# Patient Record
Sex: Female | Born: 1945 | Race: White | Hispanic: No | Marital: Married | Smoking: Former smoker
Health system: Southern US, Community
[De-identification: ages and names within clinical notes are randomized; demographics above are authoritative.]

## PROBLEM LIST (undated history)

## (undated) DIAGNOSIS — A048 Other specified bacterial intestinal infections: Secondary | ICD-10-CM

## (undated) DIAGNOSIS — K219 Gastro-esophageal reflux disease without esophagitis: Secondary | ICD-10-CM

## (undated) DIAGNOSIS — E079 Disorder of thyroid, unspecified: Secondary | ICD-10-CM

## (undated) DIAGNOSIS — E119 Type 2 diabetes mellitus without complications: Secondary | ICD-10-CM

## (undated) DIAGNOSIS — I10 Essential (primary) hypertension: Secondary | ICD-10-CM

## (undated) DIAGNOSIS — E785 Hyperlipidemia, unspecified: Secondary | ICD-10-CM

## (undated) HISTORY — DX: Gastro-esophageal reflux disease without esophagitis: K21.9

## (undated) HISTORY — PX: EYE SURGERY: SHX253

## (undated) HISTORY — PX: THROAT SURGERY: SHX803

## (undated) HISTORY — PX: BREAST BIOPSY: SHX20

## (undated) HISTORY — PX: CHOLECYSTECTOMY: SHX55

## (undated) HISTORY — DX: Type 2 diabetes mellitus without complications: E11.9

## (undated) HISTORY — PX: OTHER SURGICAL HISTORY: SHX169

## (undated) HISTORY — PX: ABDOMINAL HYSTERECTOMY: SHX81

## (undated) HISTORY — PX: HEMORRHOID SURGERY: SHX153

## (undated) HISTORY — DX: Other specified bacterial intestinal infections: A04.8

---

## 1998-02-12 ENCOUNTER — Other Ambulatory Visit: Admission: RE | Admit: 1998-02-12 | Discharge: 1998-02-12 | Payer: Self-pay | Admitting: Family Medicine

## 1998-07-03 ENCOUNTER — Other Ambulatory Visit: Admission: RE | Admit: 1998-07-03 | Discharge: 1998-07-03 | Payer: Self-pay | Admitting: *Deleted

## 1999-06-02 ENCOUNTER — Ambulatory Visit (HOSPITAL_COMMUNITY): Admission: RE | Admit: 1999-06-02 | Discharge: 1999-06-02 | Payer: Self-pay | Admitting: Gastroenterology

## 2000-07-16 ENCOUNTER — Other Ambulatory Visit: Admission: RE | Admit: 2000-07-16 | Discharge: 2000-07-16 | Payer: Self-pay | Admitting: Family Medicine

## 2000-07-30 ENCOUNTER — Ambulatory Visit (HOSPITAL_COMMUNITY): Admission: RE | Admit: 2000-07-30 | Discharge: 2000-07-30 | Payer: Self-pay | Admitting: General Surgery

## 2000-07-30 ENCOUNTER — Encounter: Payer: Self-pay | Admitting: General Surgery

## 2001-02-17 ENCOUNTER — Encounter: Payer: Self-pay | Admitting: General Surgery

## 2001-02-17 ENCOUNTER — Ambulatory Visit (HOSPITAL_COMMUNITY): Admission: RE | Admit: 2001-02-17 | Discharge: 2001-02-17 | Payer: Self-pay | Admitting: General Surgery

## 2001-02-21 ENCOUNTER — Ambulatory Visit (HOSPITAL_COMMUNITY): Admission: RE | Admit: 2001-02-21 | Discharge: 2001-02-21 | Payer: Self-pay | Admitting: General Surgery

## 2001-02-21 ENCOUNTER — Encounter: Payer: Self-pay | Admitting: General Surgery

## 2001-02-23 ENCOUNTER — Ambulatory Visit (HOSPITAL_COMMUNITY): Admission: RE | Admit: 2001-02-23 | Discharge: 2001-02-23 | Payer: Self-pay | Admitting: General Surgery

## 2001-03-21 ENCOUNTER — Encounter: Payer: Self-pay | Admitting: General Surgery

## 2001-03-21 ENCOUNTER — Ambulatory Visit (HOSPITAL_COMMUNITY): Admission: RE | Admit: 2001-03-21 | Discharge: 2001-03-21 | Payer: Self-pay | Admitting: General Surgery

## 2001-05-26 ENCOUNTER — Ambulatory Visit (HOSPITAL_COMMUNITY): Admission: RE | Admit: 2001-05-26 | Discharge: 2001-05-26 | Payer: Self-pay | Admitting: Internal Medicine

## 2002-01-19 ENCOUNTER — Encounter: Payer: Self-pay | Admitting: General Surgery

## 2002-01-19 ENCOUNTER — Ambulatory Visit (HOSPITAL_COMMUNITY): Admission: RE | Admit: 2002-01-19 | Discharge: 2002-01-19 | Payer: Self-pay | Admitting: Internal Medicine

## 2002-01-19 ENCOUNTER — Ambulatory Visit (HOSPITAL_COMMUNITY): Admission: RE | Admit: 2002-01-19 | Discharge: 2002-01-19 | Payer: Self-pay | Admitting: Pediatrics

## 2002-01-19 ENCOUNTER — Encounter: Payer: Self-pay | Admitting: Internal Medicine

## 2002-05-05 ENCOUNTER — Encounter: Payer: Self-pay | Admitting: General Surgery

## 2002-05-05 ENCOUNTER — Ambulatory Visit (HOSPITAL_COMMUNITY): Admission: RE | Admit: 2002-05-05 | Discharge: 2002-05-05 | Payer: Self-pay | Admitting: General Surgery

## 2002-05-19 ENCOUNTER — Ambulatory Visit (HOSPITAL_COMMUNITY): Admission: RE | Admit: 2002-05-19 | Discharge: 2002-05-19 | Payer: Self-pay | Admitting: General Surgery

## 2002-09-08 ENCOUNTER — Ambulatory Visit (HOSPITAL_COMMUNITY): Admission: RE | Admit: 2002-09-08 | Discharge: 2002-09-08 | Payer: Self-pay | Admitting: Internal Medicine

## 2003-10-05 ENCOUNTER — Ambulatory Visit (HOSPITAL_COMMUNITY): Admission: RE | Admit: 2003-10-05 | Discharge: 2003-10-05 | Payer: Self-pay | Admitting: Cardiology

## 2003-10-25 ENCOUNTER — Ambulatory Visit (HOSPITAL_COMMUNITY): Admission: RE | Admit: 2003-10-25 | Discharge: 2003-10-25 | Payer: Self-pay | Admitting: General Surgery

## 2004-05-09 ENCOUNTER — Emergency Department (HOSPITAL_COMMUNITY): Admission: EM | Admit: 2004-05-09 | Discharge: 2004-05-09 | Payer: Self-pay | Admitting: Emergency Medicine

## 2005-11-26 ENCOUNTER — Ambulatory Visit: Payer: Self-pay | Admitting: Family Medicine

## 2005-11-27 ENCOUNTER — Encounter (INDEPENDENT_AMBULATORY_CARE_PROVIDER_SITE_OTHER): Payer: Self-pay | Admitting: Family Medicine

## 2005-11-27 ENCOUNTER — Ambulatory Visit (HOSPITAL_COMMUNITY): Admission: RE | Admit: 2005-11-27 | Discharge: 2005-11-27 | Payer: Self-pay | Admitting: Family Medicine

## 2005-11-27 LAB — CONVERTED CEMR LAB
RBC count: 4.44 10*6/uL
TSH: 4.928 microintl units/mL

## 2005-12-07 ENCOUNTER — Ambulatory Visit (HOSPITAL_COMMUNITY): Admission: RE | Admit: 2005-12-07 | Discharge: 2005-12-07 | Payer: Self-pay | Admitting: Family Medicine

## 2005-12-10 ENCOUNTER — Encounter: Payer: Self-pay | Admitting: Family Medicine

## 2005-12-10 DIAGNOSIS — M129 Arthropathy, unspecified: Secondary | ICD-10-CM | POA: Insufficient documentation

## 2005-12-10 DIAGNOSIS — I10 Essential (primary) hypertension: Secondary | ICD-10-CM | POA: Insufficient documentation

## 2005-12-10 DIAGNOSIS — K219 Gastro-esophageal reflux disease without esophagitis: Secondary | ICD-10-CM | POA: Insufficient documentation

## 2005-12-10 DIAGNOSIS — K5900 Constipation, unspecified: Secondary | ICD-10-CM | POA: Insufficient documentation

## 2005-12-10 DIAGNOSIS — F411 Generalized anxiety disorder: Secondary | ICD-10-CM | POA: Insufficient documentation

## 2005-12-22 ENCOUNTER — Ambulatory Visit: Payer: Self-pay | Admitting: Family Medicine

## 2006-02-25 ENCOUNTER — Ambulatory Visit: Payer: Self-pay | Admitting: Family Medicine

## 2006-02-25 DIAGNOSIS — E785 Hyperlipidemia, unspecified: Secondary | ICD-10-CM | POA: Insufficient documentation

## 2006-03-01 ENCOUNTER — Encounter (INDEPENDENT_AMBULATORY_CARE_PROVIDER_SITE_OTHER): Payer: Self-pay | Admitting: Family Medicine

## 2006-03-08 ENCOUNTER — Encounter (INDEPENDENT_AMBULATORY_CARE_PROVIDER_SITE_OTHER): Payer: Self-pay | Admitting: Family Medicine

## 2006-03-12 ENCOUNTER — Encounter (INDEPENDENT_AMBULATORY_CARE_PROVIDER_SITE_OTHER): Payer: Self-pay | Admitting: Family Medicine

## 2006-03-30 ENCOUNTER — Ambulatory Visit: Payer: Self-pay | Admitting: Internal Medicine

## 2006-03-30 ENCOUNTER — Encounter (INDEPENDENT_AMBULATORY_CARE_PROVIDER_SITE_OTHER): Payer: Self-pay | Admitting: Family Medicine

## 2007-06-10 ENCOUNTER — Ambulatory Visit (HOSPITAL_COMMUNITY): Admission: RE | Admit: 2007-06-10 | Discharge: 2007-06-10 | Payer: Self-pay | Admitting: General Surgery

## 2007-06-20 ENCOUNTER — Ambulatory Visit (HOSPITAL_COMMUNITY): Admission: RE | Admit: 2007-06-20 | Discharge: 2007-06-20 | Payer: Self-pay | Admitting: General Surgery

## 2007-07-22 ENCOUNTER — Ambulatory Visit: Payer: Self-pay | Admitting: Family Medicine

## 2007-07-22 DIAGNOSIS — M51379 Other intervertebral disc degeneration, lumbosacral region without mention of lumbar back pain or lower extremity pain: Secondary | ICD-10-CM | POA: Insufficient documentation

## 2007-07-22 DIAGNOSIS — K449 Diaphragmatic hernia without obstruction or gangrene: Secondary | ICD-10-CM | POA: Insufficient documentation

## 2007-07-22 DIAGNOSIS — M5137 Other intervertebral disc degeneration, lumbosacral region: Secondary | ICD-10-CM

## 2007-07-22 DIAGNOSIS — Z8679 Personal history of other diseases of the circulatory system: Secondary | ICD-10-CM

## 2007-07-22 DIAGNOSIS — F17200 Nicotine dependence, unspecified, uncomplicated: Secondary | ICD-10-CM | POA: Insufficient documentation

## 2007-07-25 ENCOUNTER — Encounter (INDEPENDENT_AMBULATORY_CARE_PROVIDER_SITE_OTHER): Payer: Self-pay | Admitting: Family Medicine

## 2007-07-27 ENCOUNTER — Ambulatory Visit (HOSPITAL_COMMUNITY): Admission: RE | Admit: 2007-07-27 | Discharge: 2007-07-27 | Payer: Self-pay | Admitting: Family Medicine

## 2007-07-27 ENCOUNTER — Encounter (INDEPENDENT_AMBULATORY_CARE_PROVIDER_SITE_OTHER): Payer: Self-pay | Admitting: Family Medicine

## 2007-07-27 LAB — CONVERTED CEMR LAB
AST: 28 units/L (ref 0–37)
Albumin: 4.7 g/dL (ref 3.5–5.2)
Alkaline Phosphatase: 66 units/L (ref 39–117)
BUN: 16 mg/dL (ref 6–23)
Basophils Absolute: 0 10*3/uL (ref 0.0–0.1)
Chloride: 100 meq/L (ref 96–112)
Creatinine, Ser: 0.76 mg/dL (ref 0.40–1.20)
Glucose, Bld: 106 mg/dL — ABNORMAL HIGH (ref 70–99)
HCT: 44.3 % (ref 36.0–46.0)
HDL: 39 mg/dL — ABNORMAL LOW (ref >39)
LDL Cholesterol: 90 mg/dL (ref 0–99)
MCV: 92.9 fL (ref 78.0–100.0)
Neutro Abs: 1.6 10*3/uL — ABNORMAL LOW (ref 1.7–7.7)
Neutrophils Relative %: 34 % — ABNORMAL LOW (ref 43–77)
Platelets: 285 10*3/uL (ref 150–400)
Potassium: 4.5 meq/L (ref 3.5–5.3)
RBC: 4.77 M/uL (ref 3.87–5.11)
RDW: 14.1 % (ref 11.5–15.5)
TSH: 6.336 microintl units/mL — ABNORMAL HIGH (ref 0.350–4.50)
Total Bilirubin: 1 mg/dL (ref 0.3–1.2)
Total CHOL/HDL Ratio: 5
Triglycerides: 328 mg/dL — ABNORMAL HIGH (ref <150)
WBC: 4.8 10*3/uL (ref 4.0–10.5)

## 2007-08-01 ENCOUNTER — Ambulatory Visit (HOSPITAL_COMMUNITY): Admission: RE | Admit: 2007-08-01 | Discharge: 2007-08-01 | Payer: Self-pay | Admitting: Family Medicine

## 2007-08-05 ENCOUNTER — Ambulatory Visit: Payer: Self-pay | Admitting: Family Medicine

## 2007-08-05 LAB — CONVERTED CEMR LAB

## 2007-08-08 ENCOUNTER — Encounter (HOSPITAL_COMMUNITY): Admission: RE | Admit: 2007-08-08 | Discharge: 2007-08-17 | Payer: Self-pay | Admitting: Family Medicine

## 2007-08-10 ENCOUNTER — Encounter (INDEPENDENT_AMBULATORY_CARE_PROVIDER_SITE_OTHER): Payer: Self-pay | Admitting: Family Medicine

## 2007-08-15 ENCOUNTER — Ambulatory Visit: Payer: Self-pay | Admitting: Internal Medicine

## 2007-08-17 ENCOUNTER — Encounter (INDEPENDENT_AMBULATORY_CARE_PROVIDER_SITE_OTHER): Payer: Self-pay | Admitting: Family Medicine

## 2007-08-17 ENCOUNTER — Encounter (INDEPENDENT_AMBULATORY_CARE_PROVIDER_SITE_OTHER): Payer: Self-pay | Admitting: Diagnostic Radiology

## 2007-08-22 ENCOUNTER — Ambulatory Visit (HOSPITAL_COMMUNITY): Admission: RE | Admit: 2007-08-22 | Discharge: 2007-08-22 | Payer: Self-pay | Admitting: Internal Medicine

## 2007-08-22 ENCOUNTER — Ambulatory Visit: Payer: Self-pay | Admitting: Internal Medicine

## 2007-09-02 ENCOUNTER — Ambulatory Visit (HOSPITAL_COMMUNITY): Admission: RE | Admit: 2007-09-02 | Discharge: 2007-09-02 | Payer: Self-pay | Admitting: Family Medicine

## 2007-09-02 ENCOUNTER — Ambulatory Visit: Payer: Self-pay | Admitting: Family Medicine

## 2007-09-03 ENCOUNTER — Encounter (INDEPENDENT_AMBULATORY_CARE_PROVIDER_SITE_OTHER): Payer: Self-pay | Admitting: Family Medicine

## 2007-09-05 ENCOUNTER — Encounter (INDEPENDENT_AMBULATORY_CARE_PROVIDER_SITE_OTHER): Payer: Self-pay | Admitting: Family Medicine

## 2007-09-06 LAB — CONVERTED CEMR LAB: Free T4: 0.78 ng/dL — ABNORMAL LOW (ref 0.89–1.80)

## 2007-09-09 ENCOUNTER — Ambulatory Visit: Payer: Self-pay | Admitting: Family Medicine

## 2007-09-30 ENCOUNTER — Ambulatory Visit: Payer: Self-pay | Admitting: Family Medicine

## 2007-09-30 DIAGNOSIS — E039 Hypothyroidism, unspecified: Secondary | ICD-10-CM | POA: Insufficient documentation

## 2007-10-01 ENCOUNTER — Encounter (INDEPENDENT_AMBULATORY_CARE_PROVIDER_SITE_OTHER): Payer: Self-pay | Admitting: Family Medicine

## 2007-10-05 LAB — CONVERTED CEMR LAB
BUN: 18 mg/dL (ref 6–23)
CO2: 17 meq/L — ABNORMAL LOW (ref 19–32)
Chloride: 107 meq/L (ref 96–112)
Creatinine, Ser: 0.84 mg/dL (ref 0.40–1.20)
Potassium: 4.2 meq/L (ref 3.5–5.3)

## 2007-11-04 ENCOUNTER — Ambulatory Visit: Payer: Self-pay | Admitting: Family Medicine

## 2007-11-05 ENCOUNTER — Emergency Department (HOSPITAL_COMMUNITY): Admission: EM | Admit: 2007-11-05 | Discharge: 2007-11-05 | Payer: Self-pay | Admitting: Emergency Medicine

## 2007-11-09 ENCOUNTER — Encounter (INDEPENDENT_AMBULATORY_CARE_PROVIDER_SITE_OTHER): Payer: Self-pay | Admitting: Family Medicine

## 2007-11-11 LAB — CONVERTED CEMR LAB
AST: 30 units/L (ref 0–37)
Albumin: 4.3 g/dL (ref 3.5–5.2)
Alkaline Phosphatase: 71 units/L (ref 39–117)
BUN: 17 mg/dL (ref 6–23)
Calcium: 9.5 mg/dL (ref 8.4–10.5)
Glucose, Bld: 113 mg/dL — ABNORMAL HIGH (ref 70–99)
LDL Cholesterol: 125 mg/dL — ABNORMAL HIGH (ref 0–99)
Sodium: 140 meq/L (ref 135–145)
TSH: 8.488 microintl units/mL — ABNORMAL HIGH (ref 0.350–4.50)
Total Bilirubin: 0.7 mg/dL (ref 0.3–1.2)
Total CHOL/HDL Ratio: 4.3
VLDL: 28 mg/dL (ref 0–40)

## 2007-12-13 ENCOUNTER — Ambulatory Visit: Payer: Self-pay | Admitting: Family Medicine

## 2007-12-14 ENCOUNTER — Telehealth (INDEPENDENT_AMBULATORY_CARE_PROVIDER_SITE_OTHER): Payer: Self-pay | Admitting: Family Medicine

## 2007-12-14 ENCOUNTER — Encounter (INDEPENDENT_AMBULATORY_CARE_PROVIDER_SITE_OTHER): Payer: Self-pay | Admitting: *Deleted

## 2007-12-22 ENCOUNTER — Ambulatory Visit (HOSPITAL_COMMUNITY): Admission: RE | Admit: 2007-12-22 | Discharge: 2007-12-22 | Payer: Self-pay | Admitting: Family Medicine

## 2007-12-28 ENCOUNTER — Encounter (INDEPENDENT_AMBULATORY_CARE_PROVIDER_SITE_OTHER): Payer: Self-pay | Admitting: Family Medicine

## 2008-01-10 ENCOUNTER — Emergency Department (HOSPITAL_COMMUNITY): Admission: EM | Admit: 2008-01-10 | Discharge: 2008-01-10 | Payer: Self-pay | Admitting: Emergency Medicine

## 2008-01-27 ENCOUNTER — Ambulatory Visit: Payer: Self-pay | Admitting: Family Medicine

## 2008-01-28 ENCOUNTER — Encounter (INDEPENDENT_AMBULATORY_CARE_PROVIDER_SITE_OTHER): Payer: Self-pay | Admitting: Family Medicine

## 2008-01-30 LAB — CONVERTED CEMR LAB
ALT: 31 units/L (ref 0–35)
Alkaline Phosphatase: 82 units/L (ref 39–117)
Amylase: 49 units/L (ref 0–105)
Basophils Absolute: 0 10*3/uL (ref 0.0–0.1)
Basophils Relative: 0 % (ref 0–1)
Creatinine, Ser: 0.92 mg/dL (ref 0.40–1.20)
Eosinophils Absolute: 0.1 10*3/uL (ref 0.0–0.7)
Eosinophils Relative: 1 % (ref 0–5)
HCT: 39.1 % (ref 36.0–46.0)
Hemoglobin: 13.1 g/dL (ref 12.0–15.0)
MCHC: 33.5 g/dL (ref 30.0–36.0)
MCV: 88.5 fL (ref 78.0–100.0)
Monocytes Absolute: 0.5 10*3/uL (ref 0.1–1.0)
Platelets: 292 10*3/uL (ref 150–400)
RDW: 13.1 % (ref 11.5–15.5)
Sodium: 138 meq/L (ref 135–145)
Total Bilirubin: 0.6 mg/dL (ref 0.3–1.2)
Total Protein: 7.7 g/dL (ref 6.0–8.3)

## 2008-02-02 ENCOUNTER — Emergency Department (HOSPITAL_COMMUNITY): Admission: EM | Admit: 2008-02-02 | Discharge: 2008-02-02 | Payer: Self-pay | Admitting: Emergency Medicine

## 2008-02-02 ENCOUNTER — Telehealth (INDEPENDENT_AMBULATORY_CARE_PROVIDER_SITE_OTHER): Payer: Self-pay | Admitting: Family Medicine

## 2008-02-02 ENCOUNTER — Encounter: Payer: Self-pay | Admitting: Orthopedic Surgery

## 2008-02-08 ENCOUNTER — Ambulatory Visit: Payer: Self-pay | Admitting: Orthopedic Surgery

## 2008-02-08 DIAGNOSIS — S8263XA Displaced fracture of lateral malleolus of unspecified fibula, initial encounter for closed fracture: Secondary | ICD-10-CM | POA: Insufficient documentation

## 2008-02-27 ENCOUNTER — Telehealth: Payer: Self-pay | Admitting: Orthopedic Surgery

## 2008-03-21 ENCOUNTER — Telehealth (INDEPENDENT_AMBULATORY_CARE_PROVIDER_SITE_OTHER): Payer: Self-pay | Admitting: *Deleted

## 2008-03-22 ENCOUNTER — Encounter: Payer: Self-pay | Admitting: Orthopedic Surgery

## 2008-04-12 ENCOUNTER — Ambulatory Visit: Payer: Self-pay | Admitting: Family Medicine

## 2008-04-13 ENCOUNTER — Encounter (INDEPENDENT_AMBULATORY_CARE_PROVIDER_SITE_OTHER): Payer: Self-pay | Admitting: Family Medicine

## 2008-04-30 ENCOUNTER — Encounter (INDEPENDENT_AMBULATORY_CARE_PROVIDER_SITE_OTHER): Payer: Self-pay | Admitting: Family Medicine

## 2008-06-13 ENCOUNTER — Ambulatory Visit: Payer: Self-pay | Admitting: Family Medicine

## 2008-06-29 ENCOUNTER — Ambulatory Visit: Payer: Self-pay | Admitting: Family Medicine

## 2008-06-30 ENCOUNTER — Encounter (INDEPENDENT_AMBULATORY_CARE_PROVIDER_SITE_OTHER): Payer: Self-pay | Admitting: Family Medicine

## 2008-07-02 ENCOUNTER — Encounter (INDEPENDENT_AMBULATORY_CARE_PROVIDER_SITE_OTHER): Payer: Self-pay | Admitting: Family Medicine

## 2008-07-02 LAB — CONVERTED CEMR LAB
BUN: 18 mg/dL (ref 6–23)
CO2: 21 meq/L (ref 19–32)
Chloride: 106 meq/L (ref 96–112)
Creatinine, Ser: 0.81 mg/dL (ref 0.40–1.20)
Potassium: 4.4 meq/L (ref 3.5–5.3)

## 2008-07-03 ENCOUNTER — Encounter (INDEPENDENT_AMBULATORY_CARE_PROVIDER_SITE_OTHER): Payer: Self-pay | Admitting: Family Medicine

## 2008-07-04 ENCOUNTER — Ambulatory Visit (HOSPITAL_COMMUNITY): Admission: RE | Admit: 2008-07-04 | Discharge: 2008-07-04 | Payer: Self-pay | Admitting: Family Medicine

## 2008-07-05 ENCOUNTER — Encounter (INDEPENDENT_AMBULATORY_CARE_PROVIDER_SITE_OTHER): Payer: Self-pay | Admitting: Family Medicine

## 2008-07-05 DIAGNOSIS — M545 Low back pain: Secondary | ICD-10-CM

## 2008-07-13 ENCOUNTER — Ambulatory Visit: Payer: Self-pay | Admitting: Family Medicine

## 2008-07-13 DIAGNOSIS — W57XXXA Bitten or stung by nonvenomous insect and other nonvenomous arthropods, initial encounter: Secondary | ICD-10-CM | POA: Insufficient documentation

## 2008-07-13 DIAGNOSIS — T148 Other injury of unspecified body region: Secondary | ICD-10-CM

## 2008-08-29 ENCOUNTER — Ambulatory Visit: Payer: Self-pay | Admitting: Family Medicine

## 2009-02-20 ENCOUNTER — Emergency Department (HOSPITAL_COMMUNITY): Admission: EM | Admit: 2009-02-20 | Discharge: 2009-02-20 | Payer: Self-pay | Admitting: Emergency Medicine

## 2009-10-18 IMAGING — US US AORTA
1 series · 14 of 25 positions shown · non-contrast
Comparison: CT from 12/07/2005

CLINICAL DATA: Abdominal pain and possible reaction to the flu
shunt.

ULTRASOUND AORTA

[Series 1: unknown · 0.30mm/px · 14 of 41 slices shown]
[im 1/41]
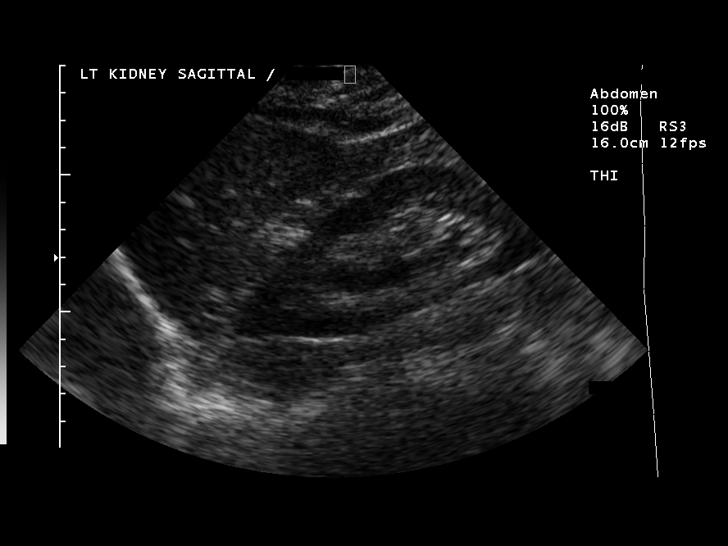
[im 4/41]
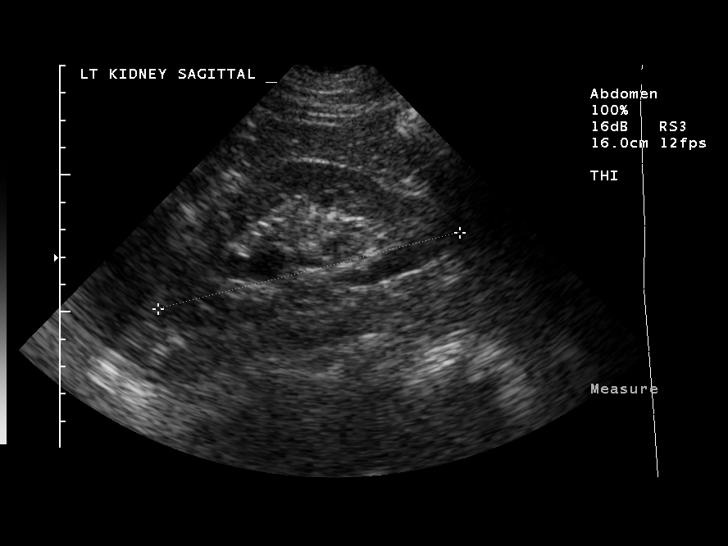
[im 7/41]
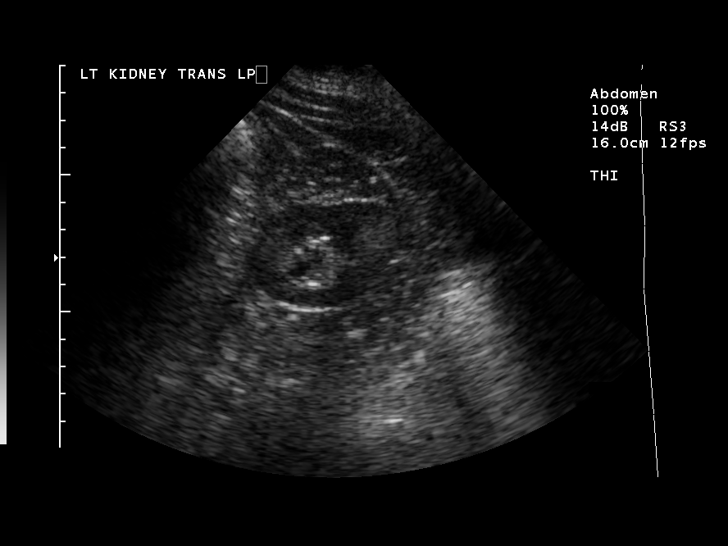
[im 11/41]
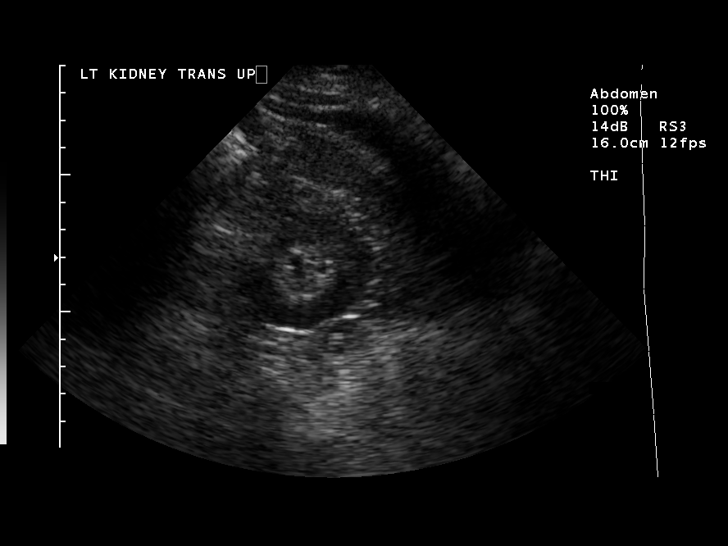
[im 14/41]
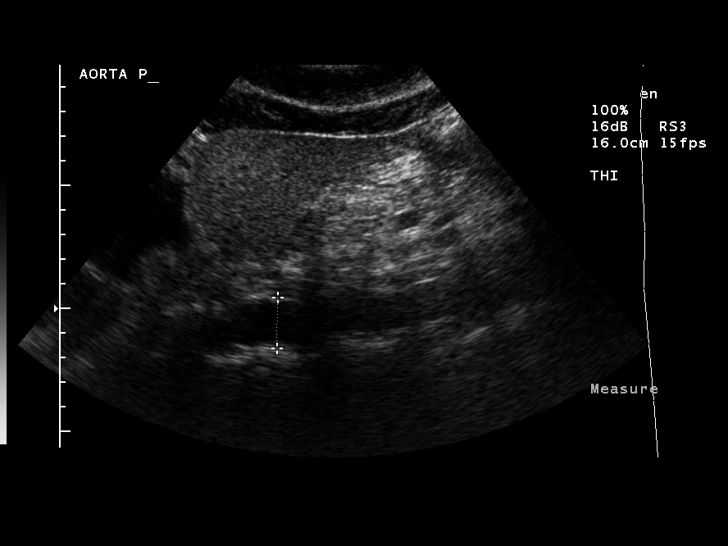
[im 16/41]
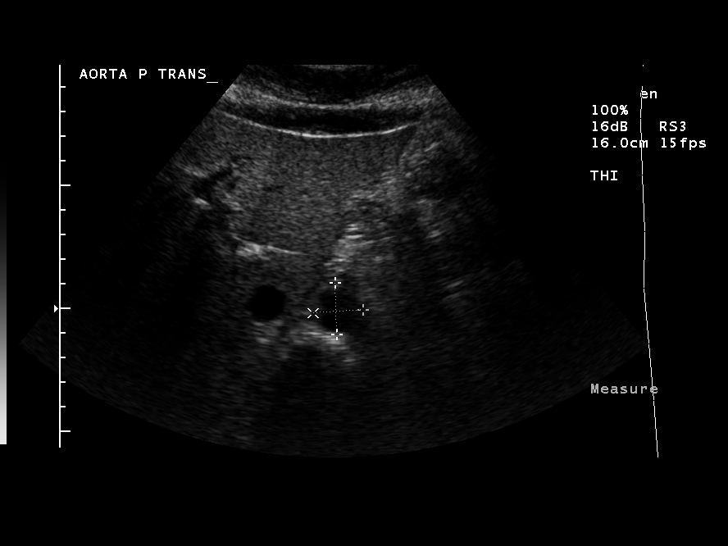
[im 19/41]
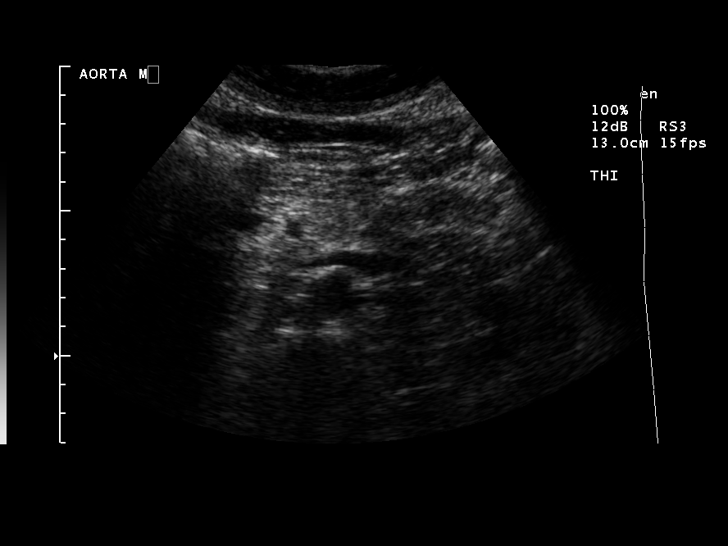
[im 22/41]
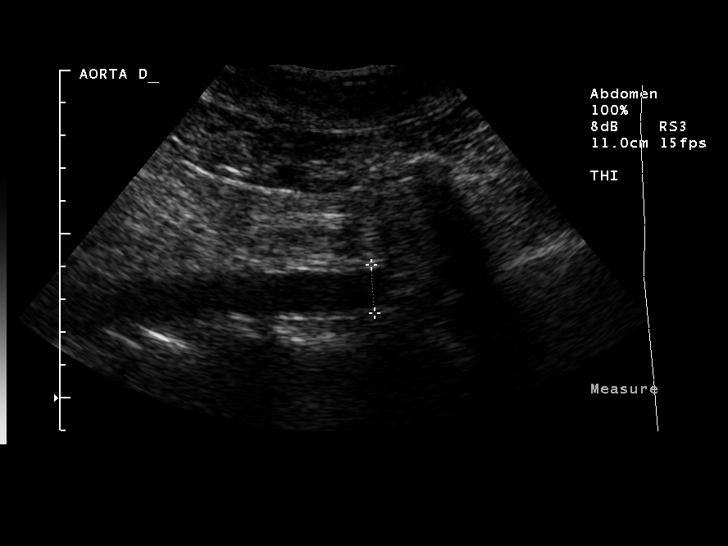
[im 26/41]
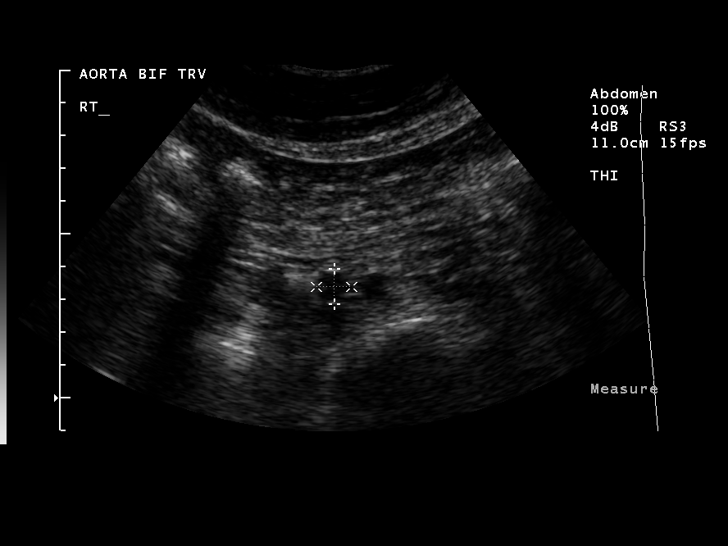
[im 27/41]
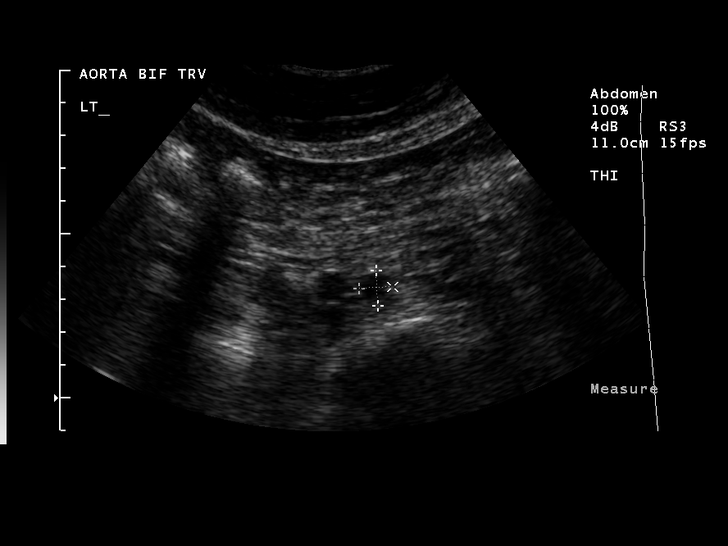
[im 31/41]
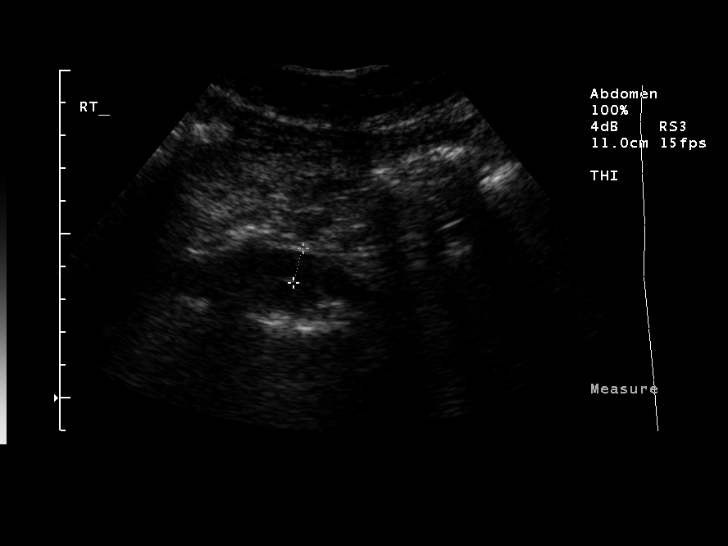
[im 34/41]
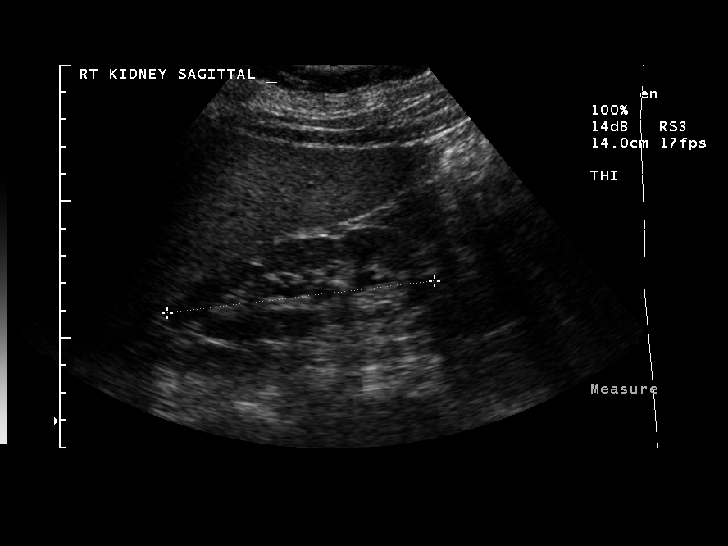
[im 37/41]
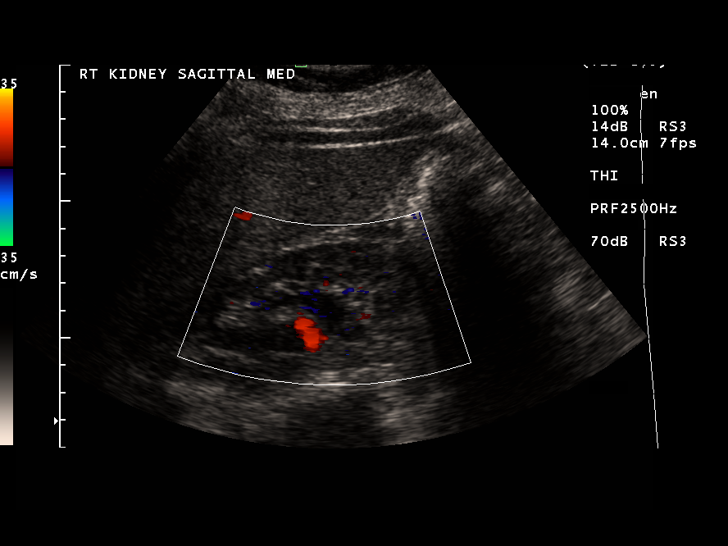
[im 41/41]
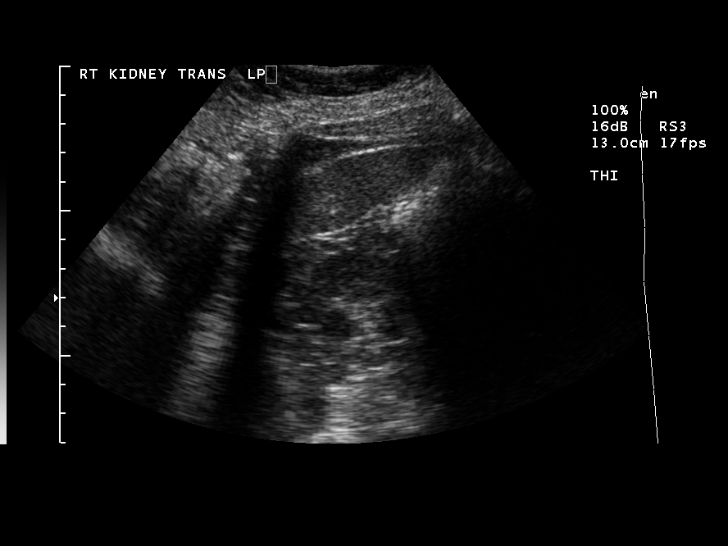

[14 of 25 positions shown; findings below may reference images not displayed]

FINDINGS: The left kidney measures 11.3 cm in length.  The patient
has left parapelvic cysts based on the previous CT examination.
There are hypoechoic areas in the left renal sinus that probably
represent parapelvic cysts.  There does not appear to be a
dilatation of the left renal pelvis.  The proximal abdominal aorta
measures up to 2.1 cm.  The mid abdominal aorta measures 1.9 cm.
The distal abdominal aorta measures up to 1.6 cm.  The common iliac
arteries measure 1.1 cm.  The right kidney measures up to 11.1 cm.
Mild fullness of the right renal collecting system.
IMPRESSION: Normal caliber of the abdominal aorta.  Negative for aneurysmal
dilatation.

Prominent renal collecting systems.  The left kidney findings
probably represent parapelvic cysts.

## 2010-02-02 ENCOUNTER — Encounter: Payer: Self-pay | Admitting: Family Medicine

## 2010-02-02 ENCOUNTER — Encounter: Payer: Self-pay | Admitting: General Surgery

## 2010-04-02 LAB — DIFFERENTIAL
Basophils Relative: 1 % (ref 0–1)
Eosinophils Absolute: 0.1 10*3/uL (ref 0.0–0.7)
Eosinophils Relative: 1 % (ref 0–5)
Lymphs Abs: 2.8 10*3/uL (ref 0.7–4.0)
Monocytes Relative: 8 % (ref 3–12)
Neutrophils Relative %: 47 % (ref 43–77)

## 2010-04-02 LAB — POCT I-STAT, CHEM 8
BUN: 20 mg/dL (ref 6–23)
Calcium, Ion: 1.15 mmol/L (ref 1.12–1.32)
Chloride: 105 mEq/L (ref 96–112)
HCT: 37 % (ref 36.0–46.0)
Potassium: 3.8 mEq/L (ref 3.5–5.1)

## 2010-04-02 LAB — CBC
HCT: 38.7 % (ref 36.0–46.0)
MCHC: 34.7 g/dL (ref 30.0–36.0)
MCV: 90.2 fL (ref 78.0–100.0)
RBC: 4.3 MIL/uL (ref 3.87–5.11)
WBC: 6.7 10*3/uL (ref 4.0–10.5)

## 2010-05-27 NOTE — Consult Note (Signed)
NAMELORENA, Kent               ACCOUNT NO.:  [id number]   MEDICAL RECORD NO.:  [id number]          PATIENT TYPE:  AMB   LOCATION:  DAY                           FACILITY:  [facility]   PHYSICIAN:  R. Roetta Sessions, M.D. DATE OF BIRTH:  [birth year]   DATE OF CONSULTATION:  08/15/2007  DATE OF DISCHARGE:                                 CONSULTATION   REASON FOR CONSULTATION:  Difficulty swallowing, abdominal pain, and  nausea.   PHYSICIAN REQUESTING CONSULTATION:  Franchot Heidelberg, MD.   Co-signing Physician:  R. Roetta Sessions, M.D.   HISTORY OF PRESENT ILLNESS:  [name] is a 65 year old lady with  history of IBS and chronic epigastric pain in the realm of dyspepsia who  presents today for further evaluation on dysphagia, nausea, and lower  abdominal pain.  She was actually last seen back in March 2008 for  abdominal pain and constipation.  She failed to followup for her  followup appointment in April 2008.  She states in the last year, she  had been doing reasonably well.  She does complain of several-month  history of dysphagia to both solid foods and pills.  She feels like food  is getting stuck in her midchest.  She has to wash it down.  She also  recently found out she has thyroid nodules and she is scheduled to get  these biopsied on Wednesday.  She complains of wakening up in the middle  of the night with a choking sensation.  She denies any heartburn.  She  is on Protonix daily.  Denies any vomiting, but has frequent nauseas.  Sometimes, she wakes up with nausea and it gets worse as she eats.  Denies any vomiting.  She complains of lower abdominal pain, and  unfortunately, she is not able to describe her symptoms very well.  She  says she just gets it and it may last for a couple of days at a time.  She really has not noticed any aggravating or alleviating factors.  She  sucks on lemons whenever she has the nausea and this provides decent  relief.  She generally has 2 or  3 bowel movements a day.  She denies any  blood in the stool or melena.  Denies any fever, chills, dysuria, or  hematuria.  She has chronic chest pain.  She states she was told it is  not her heart.  She has had catheterization around 2004 and states that  it was negative.  Chest pain comes with and without exertion.   CURRENT MEDICATIONS:  1. Aspirin 81 mg daily.  2. Protonix daily.  3. She states that she is not taking her Zocor, Prozac, or metoprolol,      and that Dr. Erby Pian may not be aware of this.   ALLERGIES:  No known drug allergies.   PAST MEDICAL HISTORY:  1. Hypercholesterolemia.  2. Depression.  3. GERD.  4. Anxiety.  5. Hypertension.  6. Recent diagnosis of thyroid nodule, scheduled for biopsy on      Wednesday.  7. History of chronic dyspepsia.  8. History of chronic  IBS.  9. History of H. Pylori, status post treatment with a subsequent      positive breath test, status post retreatment.  No confirmation      test been done to my knowledge, verifying eradication.   Her last EGD and colonoscopy was in 2004 when she had heme-positive  stools and a GI bleed.  She had a small hiatal hernia and left-sided  diverticula and hemorrhoids.  She is due for routine screening  colonoscopy in 2014.  She has had prior EGD for dysphagia without any  obvious strictures, status post 56-French [facility] dilator back in 2003.  She has had a hemorrhoidectomy, cholecystectomy, right and left breast  biopsies, which were benign, a hysterectomy for menorrhagia,  appendectomy, and tubal ligation.   FAMILY HISTORY:  Mother had diabetes and a brain tumor, father had CHF,  2 brothers had stroke.  No family history of colorectal cancer.   SOCIAL HISTORY:  She has been married for 44 years.  She has 4 children.  She is on disability.  She smokes about 2 cigarettes a week.  No alcohol  use.   REVIEW OF SYSTEMS:  See HPI for GI.  CARDIOPULMONARY:  See HPI.  GENITOURINARY:  See HPI.   CONSTITUTIONAL:  No unintentional loss.   PHYSICAL EXAMINATION:  VITAL SIGNS:  Weight 158, height 5 feet 1-1/2  inches, temp 98.5, blood pressure 134/84, pulse is 80.  GENERAL:  Pleasant, well-nourished, well-developed Caucasian female,  slightly obese, in no acute distress.  SKIN: Warm and dry.  No jaundice.  HEENT:  Sclerae nonicteric.  Oropharyngeal mucosa moist and pink.  No  lesions, erythema, or exudate.  No lymphadenopathy or thyromegaly.  CHEST:  Lungs are clear to auscultation.  CARDIAC:  Regular rate and rhythm.  Normal S1 and S2.  No murmurs, rubs,  or gallops.  ABDOMEN:  Positive bowel sounds.  Abdomen is soft.  She has mild  tenderness in the lower abdomen to deep palpation.  No rebound or  guarding.  No abdominal bruits or hernias.  No hepatosplenomegaly or  masses.  EXTREMITIES:  Lower extremities, no edema.   IMPRESSION:  [name] is a 65 year old lady with history of chronic  dyspepsia and IBS.  She presents for dysphagia to solid foods and pills.  She recently was found to have thyroid nodules and is not clear how much  of this is causing her symptoms.  She also complains of chronic nausea,  worse postprandially without vomiting.  She has chronic lower abdominal  pain, which is essentially unchanged and likely due to IBS.  I would  offer her an upper endoscopy at this time for further evaluation of  dysphagia and nausea.   PLAN:  1. EGD with Dr. Jena Gauss in the near future.  I discussed risks,      alternatives, and benefits with the risk of reaction to medication,      bleeding infection, perforation, and she is agreeable to proceed.  2. She will continue Protonix 40 mg daily.  3. I advised her to discuss with Dr. Erby Pian that she is not taking      her medications as prescribed.  4. Further recommendations to follow.      Tana Coast, P.AJonathon Bellows, M.D.  Electronically Signed    LL/MEDQ  D:  08/15/2007  T:  08/16/2007  Job:   [id number]   cc:   Franchot Heidelberg, M.D.

## 2010-05-27 NOTE — Op Note (Signed)
Connie Kent, Connie Kent               ACCOUNT NO.:  [id number]   MEDICAL RECORD NO.:  [id number]          PATIENT TYPE:  AMB   LOCATION:  DAY                           FACILITY:  [facility]   PHYSICIAN:  R. Roetta Sessions, M.D. DATE OF BIRTH:  09-24-1945   DATE OF PROCEDURE:  08/22/2007  DATE OF DISCHARGE:                               OPERATIVE REPORT   PROCEDURE:  Esophagogastroduodenoscopy with Elease Hashimoto dilation.   INDICATIONS FOR PROCEDURE:  A 65 year old lady with recurrent esophageal  dysphagia to solids has a history of gastroesophageal flux disease, on  Protonix 40 mg orally daily.  EGD is now being done.  Potential for  esophageal dilation reviewed.  Risks, benefits, alternatives, and  limitations have been discussed.  All parties' questions were answered  and all parties were agreeable.   PROCEDURE NOTE:  O2 saturation, blood pressure, pulse rate, and  respirations were monitored throughout the entire procedure.   CONSCIOUS SEDATION:  Versed 4 mg IV and Demerol 100 mg IV in divided  doses.  Cetacaine spray for topical pharyngeal anesthesia.   INSTRUMENT:  Pentax video chip system.   FINDINGS:  Examination of the tubular esophagus revealed no mucosal  abnormalities.  EG junction easily traversed.  Stomach:  Gastric cavity  insufflated well with air.  Thorough examination of the gastric mucosa  including retroflexion of the proximal stomach and esophagogastric  junction demonstrated only a small hiatal hernia.  Pylorus was patent  and easily traversed.  Examination of the bulb and second portion  revealed no abnormalities.  The gastric mucosa did appear entirely  normal aside for some benign-appearing polypoid antral mucosa.  Therapeutic/diagnostic maneuvers performed.  Scope was withdrawn.  A 56-  French Maloney dilator was passed to full insertion with ease.  A look  back revealed no apparent complications related passage of dilator.  The  patient tolerated the procedure well and  was reactive to Endoscopy.   IMPRESSION:  1. Normal esophagus status post passage of a 56-French Maloney      dilator.  2. Small hiatal hernia, benign-appearing polypoid antral mucosa,      otherwise unremarkable gastric mucosa, patent pylorus, normal D1      and D2.   RECOMMENDATIONS:  Continue Protonix 40 mg orally daily.  Hopefully, she  will realize significant improvement in swallowing, as in 2004 we passed  [facility] dilator empirically for similar symptoms and this gave her a  long-lasting relief from symptoms.  She is to let us know if she has any  recurrent dysphagia symptoms.      Jonathon Bellows, M.D.  Electronically Signed     RMR/MEDQ  D:  08/22/2007  T:  08/23/2007  Job:  [id number]   cc:   Rosario Jacks  Fax: [phone]

## 2010-05-30 NOTE — Procedures (Signed)
[name], [name] NO.:  [id number]   MEDICAL RECORD NO.:  [id number]          PATIENT TYPE:  OUT   LOCATION:  RAD                           FACILITY:  [facility]   PHYSICIAN:  Pricilla Riffle, M.D.    DATE OF BIRTH:  Jun 01, 1945   DATE OF PROCEDURE:  10/05/2003  DATE OF DISCHARGE:                                    STRESS TEST   HISTORY:  Connie Kent is a 65 year old with no known coronary artery disease  and atypical chest discomfort.  Her cardiac risk factors include unknown  lipids.   BASELINE DATA:  EKG reveals sinus rhythm at 77 beats per minute, nonspecific  ST abnormalities.  Blood pressure is 160/78.   The patient exercised for 4 minutes to a Bruce protocol stage 2 at 7 mets.  Maximum heart rate was 161 beats per minute which is 99% of predicted  maximum.  Maximum blood pressure was 198/80, which resolved down to 158/72  in recovery.  EKG revealed no ischemic changes and no arrhythmias.  The  patient reported shortness of breath, no chest pain.  Exercise was stopped  secondary to shortness of breath and fatigue.   Final images and results are pending M.D. review.      AB/MEDQ  D:  10/05/2003  T:  10/05/2003  Job:  [id number]

## 2010-05-30 NOTE — Procedures (Signed)
. [facility]  Patient:    Connie Kent, Connie Kent                      MRN: [id number] Proc. Date: 06/02/99 Adm. Date:  [id number] Disc. Date: [id number] Attending:  Nelda Marseille CC:         Petra Kuba, M.D.             Vernon Prey, M.D.                           Procedure Report  PROCEDURE:  Colonoscopy.  INDICATIONS:  Abdominal pain, probably due to adhesions, guaiac positivity and some bright red blood per rectum in a patient due for colonic screening. Consent was signed after risks, benefits, methods and options were thoroughly discussed multiple times in the office.  MEDICATIONS:  Demerol 100, Versed 10.  PROCEDURE:  Rectal inspection pertinent for small external hemorrhoids. Digital examination was negative.  The pediatric video colonoscope was inserted and with some difficulty due to some tortuosity and looping.  We were able to advance around the colon to the cecum.  This did require rolling her on her back and some abdominal pressure.  On insertion, some formed stool was seen in the colon, a little bit in the rectum and sigmoid, some in both flexures and the majority of the ascending and the cecum.  These were formed stool that could not be washed off.  The cecum was identified by the ileocecal valve, but the stool filled the cecum and you could not see the cecal ______.  We were able to advance into the terminal ileum, which was grossly normal.  The scope was slowly withdrawn back to the mid transverse.  No abnormalities were seen.  We went ahead and washed some of the stool, although, could not move it.  Readvanced into the cecum and then roller her on her left side and we withdrew.  I am not sure if any of the stool did change positions, but on slow withdrawal, no mass, lesions or polyps were seen.  We did try to wash away some of the stool if possible from the left side of the colon, but no additional findings were seen as we slowly  withdrew back to the rectum.  She had some difficulty holding air, but we were able to get adequate visualization we think of the rectum and distal sigmoid, as well as, retroflexed view, which does confirm the hemorrhoids.  The scope was straightened, air was withdrawn, the scope removed.  The patient tolerated the procedure well.  There were no obvious immediate complication.  ENDOSCOPIC DIAGNOSES: 1. Internal, external hemorrhoids. 2. Poor prep, particularly in the cecum, ascending and hepatic flexure and    some other stool seen and unable to suction. 3. Otherwise within normal limits to the terminal ileum.  PLAN:  Consider small-bowel follow-through next.  In the meantime, trial of Librax.  She does take pump inhibitors, which seem to help some, but she just does not seem like she is willing to take the medicine.  I asked her to call me p.r.n. and otherwise follow up in six weeks to recheck symptoms, guaiacs and see if any further work-up plans are needed. DD:  06/02/99 TD:  06/05/99 Job: [id number]

## 2010-05-30 NOTE — Op Note (Signed)
Connie Kent, Connie Kent                         ACCOUNT NO.:  [id number]   MEDICAL RECORD NO.:  [id number]                   PATIENT TYPE:  AMB   LOCATION:  DAY                                  FACILITY:  [facility]   PHYSICIAN:  Barbaraann Barthel, M.D.              DATE OF BIRTH:  [birth year]   DATE OF PROCEDURE:  05/19/2002  DATE OF DISCHARGE:                                 OPERATIVE REPORT   PREOPERATIVE DIAGNOSES:  1. 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  Final     pathology pending.  2. Mass (likely lipoma) left back.   POSTOPERATIVE DIAGNOSES:  1. 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  Final     pathology pending.  2. Mass (likely lipoma) left back.   SURGEON:  Barbaraann Barthel, M.D.   INDICATIONS:  This is a 65 year old white female who has a recurrent  ganglion on the lateral aspect at just beyond the MP joint of the left ring  finger on the palmar aspect.  This had been excised previously.  This had  recurred and caused her pain and difficulty in doing her work at home.  She  also had a tender mass in her left back in the flank area.  We had planned  for excision in the operating room environment as these were complicated for  positioning and difficulty.  The procedures were explained in detail and  complications including bleeding, infection, and as far as the hand surgery  goes, numbness in that area and recurrence were discussed.  Informed consent  was obtained.   SPECIMENS:  1. Ganglion, left ring finger.  2. Lipoma, left back.   DESCRIPTION OF PROCEDURE:  The patient was placed in the supine position.  She had received some sedation from the anesthetist.  The anesthetist also  had performed the digital block.  The hand was prepped with Betadine  solution and draped in the usual manner.  I used an Esmarch bandage to help  with venous evacuation and then used a diagonal incision across the area  palpated at the lateral aspect  just above the MP joint.  We dissected free,  removed the ganglion and then checked for hemostasis with a needle-tip  cautery device.  I then, after irrigating, closed the wound with 5-0 nylon  sutures and a sterile dressing with Neosporin was applied with a Surgiflex  tubular gauze dressing.   Attention was then turned to the left back.  The patient was prepped with  Betadine solution, draped in the usual manner and using 1% Xylocaine without  epinephrine, this area was anesthetized as well.  A transverse incision was  carried out.  The lipoma was removed.  Bleeding was controlled with a  cautery device and 5-0 nylon was used to approximate the skin.  Prior to  closure, all sponge, needle and instrument counts were found to be correct.  Estimated blood loss was minimal.  No drains were placed.  There were no  complications.                                               Barbaraann Barthel, M.D.    WB/MEDQ  D:  05/19/2002  T:  05/19/2002  Job:  [id number]

## 2010-05-30 NOTE — Op Note (Signed)
[facility]  Patient:    Connie Kent, Connie Kent Visit Number: [id number] MRN: [id number]          Service Type: END Location: DAY Attending Physician:  Jonathon Bellows Dictated by:   Roetta Sessions, M.D. Proc. Date: 05/26/01 Admit Date:  05/26/2001 Discharge Date: 05/26/2001   CC:         Scarlett Presto, M.D.   Operative Report  PROCEDURE:  Esophagogastroduodenoscopy with [facility] dilation.  INDICATIONS FOR PROCEDURE:  The patient is a 65 year old lady referred by Dr. Scarlett Presto for further evaluation of epigastric pain. She is referred for EGD. She has had intermittent epigastric pain for some time. I saw her in 57. She was also having esophageal dysphagia then as she does now. EGD at that time revealed no significant abnormalities. Her dysphagia did respond to passage of a 63 [place] Maloney dilator. Since I last saw this nice lady, she was evaluated by Dr. Vida Rigger down in [place]. In the [facility] medical record, she underwent a barium esophagogram, upper GI series and CT of the abdomen in 2001 (by Dr. Ewing Schlein) all of these were normal. [name] tells me that no specific reason for her symptoms were found by Dr. Ewing Schlein and her symptoms have persisted intermittently for years. She is status post cholecystectomy for cholelithiasis. EGD is now being done to further evaluate recurrent/persistent esophageal dysphagia and epigastric pain. Laboratory evaluation is pending through Dr. Bertram Savin office. The approach of EGD with possible dilation was discussed with [name] at length. The potential risks, benefits, and alternatives have been reviewed and questions answered. She is agreeable. Please see my handwritten H&P for more information.  MONITORING:  O2 saturations, blood pressure, pulse and respirations monitored throughout the entire procedure.  CONSCIOUS SEDATION:  Versed 3 mg IV, Demerol 50 mg IV in divided doses. Cetacaine spray was used  for topical oropharyngeal anesthesia.  INSTRUMENT:  Olympus video chip gastroscope.  FINDINGS:  Examination of the tubular esophagus revealed no mucosal abnormalities. The EG junction was easily traversed.  STOMACH:  The gastric cavity was empty and insufflated well with air. A thorough examination of the gastric mucosa including a retroflexed view of the proximal stomach and esophagogastric junction demonstrated no abnormalities. The pylorus was patent and was easily traversed.  DUODENUM:  The bulb and second portion appeared normal.  THERAPEUTIC/DIAGNOSTIC MANEUVERS PERFORMED:  A 56 French Maloney dilator was passed to full insertion without resistance and good patient tolerance. A look back in the esophagus and stomach revealed no apparent complications related to passage of the dilator.  The patient tolerated the procedure and was reacted in endoscopy.  IMPRESSION:  Normal esophagus, stomach and duodenum to the second portion. Status post passage of a 2 [place] Maloney dilator.  RECOMMENDATIONS:  Will have [name] come back to see [place] in a few weeks to assess her progress, will also retrieve labs ordered through Dr. Bertram Savin office and will go from there. Dictated by:   Roetta Sessions, M.D. Attending Physician:  Jonathon Bellows DD:  05/26/01 TD:  05/30/01 Job: [id number]

## 2010-05-30 NOTE — Op Note (Signed)
[facility]  Patient:    Connie Kent, Connie Kent Visit Number: [id number] MRN: [id number]          Service Type: DSU Location: DAY Attending Physician:  Barbaraann Barthel Dictated by:   Barbaraann Barthel, M.D. Proc. Date: 02/23/01 Admit Date:  02/23/2001                             Operative Report  PREOPERATIVE DIAGNOSIS:  Ganglion cyst, left ring finger.  POSTOPERATIVE DIAGNOSIS:  PROCEDURE:  SURGEON:  Barbaraann Barthel, M.D.  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  This appeared to be a ganglion cyst clinically and this was also felt to be the impression radiographically.  We discussed the excisional biopsy procedure including complications not limited to, but including bleeding, infection and residual paresthesias of the finger and informed consent was obtained.  GROSS OPERATIVE FINDINGS:  There was approximately a pea-sized ganglion cyst just distal to the pulley mechanism and the flexor tendons of the proximal portion of the left ring finger.  TECHNIQUE:  The patient was placed in a supine position.  After the adequate administration of Bier block anesthesia of the left upper extremity, the patients left upper extremity was prepped with Betadine solution and draped in usual manner.  The venous circulation was evacuated using the Esmarch bandage just prior to inflating the tourniquet to 250 mmHg.  With the patient prepped and draped as mentioned, a diagonal incision was carried over the previously marked nodule and through skin and subcutaneous tissue, down to the flexor tendons in this area, in the very proximal portion of the ring finger on the palmar aspect.  This was dissected free.  Its insertion on the tendon site was lightly cauterized and after  checking for hemostasis, the wound was then irrigated and the skin was approximated with interrupted horizontal sutures of 5-0 nylon.  Xeroform and a sterile dressing were applied.  Prior to closure, all sponge, needle and instrument counts were found to be correct. Estimated blood loss was minimal.  There were no complications. Dictated by:   Barbaraann Barthel, M.D. Attending Physician:  Barbaraann Barthel DD:  02/23/01 TD:  02/23/01 Job: [id number]

## 2010-05-30 NOTE — H&P (Signed)
NAME:  Connie Kent, Connie Kent                         ACCOUNT NO.:  [id number]   MEDICAL RECORD NO.:  [id number]                   PATIENT TYPE:   LOCATION:                                       FACILITY:   PHYSICIAN:  R. Roetta Sessions, M.D.              DATE OF BIRTH:  July 29, 1945   DATE OF ADMISSION:  08/02/2002  DATE OF DISCHARGE:                                HISTORY & PHYSICAL   CHIEF COMPLAINT:  Hemoccult-positive stool on rectal exam by Dr. Malvin Johns.   HISTORY OF PRESENT ILLNESS:  Connie Kent is here for further evaluation of recent  Hemoccult-positive stool and mild anemia detected by Dr. Malvin Johns.  She was  last seen in January of 2004 with multiple GI complaints.  At that time, she  had a CT of the abdomen and pelvis, which revealed bilateral simple-  appearing renal cysts which were stable, increased density effecting the  region of the root of the mesentery stable, and sigmoid colon  diverticulosis.  She returned her Hemoccults at that time, which were  negative.  Barium swallow revealed a small hiatal hernia, but otherwise  unremarkable.  She had a hemoglobin at that time as well, which was 11.7 and  hematocrit 35.  She failed to follow up with [place].  She states that since we  last saw her she continues to have intermittent bouts of epigastric/right  upper quadrant abdominal pain.  This does not seem to be particularly  related to anything.  At times she strangles on her saliva and has be beat  on the back.  Denies any dysphagia to liquids or solids.  She has a history  of alternating constipation and diarrhea.  Over the last several months, her  stools have been more regular.  Three months ago, she passed some sort of  jelly-like material in her bowels.  Denies any melena, rectal bleeding,  heartburn, nausea, or vomiting.  She recently had a cyst removed from her  left hand.   CURRENT MEDICATIONS:  1. Tylenol p.r.n.  2. Protonix 40 mg p.o. daily.   ALLERGIES:  No known drug  allergies.   PAST MEDICAL HISTORY:  1. History of chronic dyspepsia.  2. Treated with Helicobacter pylori with subsequent positive Helicobacter     pylori breath test.  She has been retreated.  3. History of chronic IBS.  4. Her last EGD was in May of 2003 for epigastric pain and dysphagia.  This     was a normal study and underwent passage of a 56 [place] Maloney dilator.  5. She had a colonoscopy in January of 2002 which revealed internal     hemorrhoids, but otherwise normal colon.  This was done for Hemoccult-     positive stools as well.   PAST SURGICAL HISTORY:  1. Hemorrhoidectomy.  2. Cholecystectomy.  3. Right and left breast biopsies, benign.  4. Hysterectomy for menorrhagia.  5. Appendectomy.  6. Tubal ligation.   FAMILY HISTORY:  Her mother had diabetes mellitus and a brain tumor.  Her  father died of heart failure.  A brother had a stroke.  No family history of  colorectal cancer.   SOCIAL HISTORY:  She has been married for 39 years.  She has four children.  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  She denies any tobacco  or alcohol use.   REVIEW OF SYSTEMS:  GASTROINTESTINAL:  Please see the HPI.  GENERAL:  Denies  any weight loss.  CARDIOPULMONARY:  Denies any chest pain or shortness of  breath.   PHYSICAL EXAMINATION:  WEIGHT:  128 pounds.  VITAL SIGNS:  Blood pressure 128/70, pulse 80.  GENERAL APPEARANCE:  A pleasant, well-developed, well-nourished, Caucasian  female in no acute distress.  SKIN:  Warm and dry.  No jaundice.  HEENT:  The conjunctivae are pink.  Sclerae nonicteric.  Oropharyngeal  mucosa moist and point.  No lesions, erythema, or exudates.  NECK:  No lymphadenopathy or thyromegaly.  CHEST:  Lungs clear to auscultation.  CARDIAC:  Regular rate and rhythm.  Normal S1 and S2.  No murmurs, rubs, or  gallops.  ABDOMEN:  Positive bowel sounds.  Soft and nontender.  She has diffuse mild  pain to deep palpation with increased  tenderness in the epigastric/right  upper quadrant region.  No organomegaly.  No masses.  No rebound tenderness  or guarding.  EXTREMITIES:  No edema.   IMPRESSION:  1. Hemoccult-positive stool on rectal examination with mild anemia.     Hemoglobin 11.7.  The patient has had this in the past and has undergo     colonoscopy and EGD as outlined above.  Her enzyme level is very high.     She is requesting repeat endoscopy at this time.  In addition, she is     having epigastric pain, which I feel needs to be evaluated as well.  2. She has a history of chronic dyspepsia and chronic irritable bowel     syndrome.  She is only on Protonix at this time.  3. History of Helicobacter pylori in the past which was treated with     subsequent positive Helicobacter pylori breath test.  She has been     retreated, but her current Helicobacter pylori status is unknown.    PLAN:  1. Colonoscopy and EGD in the near future.  2. Continue Protonix.   I would like to thank Dr. Malvin Johns for allowing [place] to take part in the care  of this patient.     Connie Kent, Connie Kent, M.D.    LL/MEDQ  D:  08/02/2002  T:  08/02/2002  Job:  [id number]   cc:   Barbaraann Barthel, M.D.  [address]  [place]  [place] [address]  Fax: [phone]

## 2010-05-30 NOTE — Op Note (Signed)
[facility]  Patient:    Connie Kent, Connie Kent Visit Number: [id number] MRN: [id number]          Service Type: END Location: DAY Attending Physician:  Jonathon Bellows Dictated by:   Roetta Sessions, M.D. Proc. Date: 05/26/01 Admit Date:  05/26/2001 Discharge Date: 05/26/2001   CC:         Dr. Malvin Johns   Operative Report  MODIFICATION TO JOB# [id number]  STOMACH:  The gastric cavity insufflated well with air. Through examination of the gastric mucosa including a retroflexed view of the proximal stomach and esophagogastric junction demonstrated several superficial antral erosions. No ulcer or infiltrating process was seen.  IMPRESSION:  1. Normal EGD status post passage of a 56 [place] Maloney dilator.  2. Superficial antral erosions. The remainder of stomach appeared normal.     Normal D1, D2.  RECOMMENDATIONS:  Will stay the same with the addition of:  Will review our medical record to see if she has had her H. pylori status addressed previously. Dictated by:   Roetta Sessions, M.D. Attending Physician:  Jonathon Bellows DD:  05/26/01 TD:  05/30/01 Job: [id number]

## 2010-05-30 NOTE — Op Note (Signed)
NAME:  Connie Kent, Connie Kent                         ACCOUNT NO.:  [id number]   MEDICAL RECORD NO.:  [id number]                   PATIENT TYPE:  AMB   LOCATION:  DAY                                  FACILITY:  [facility]   PHYSICIAN:  R. Roetta Sessions, M.D.              DATE OF BIRTH:  March 27, 1945   DATE OF PROCEDURE:  09/08/2002  DATE OF DISCHARGE:                                 OPERATIVE REPORT   PROCEDURE:  Diagnostic esophagogastroduodenoscopy and colonoscopy.   INDICATIONS FOR PROCEDURE:  The patient is a 65 year old lady with numerous  complaints.  She is Hemoccult-positive and anemic.  She has been having  upper abdominal pain as well.  EGD and colonoscopy are now being done.  This  approach has been discussed with the patient previously.  The potential  risks, benefits, and alternatives have been reviewed.  She has put the  procedure off until now.  Please see the H&P on the chart for more  information.   PROCEDURE:  O2 saturation, blood pressure, pulses, and respirations were  monitored throughout the entire procedure.  Conscious sedation was with  Versed 5 mg IV, Demerol 100 mg IV in divided doses for both procedures,  Cetacaine spray for topical oropharyngeal anesthesia.  The instrument used  was the Olympus video chip adult gastroscope and colonoscope.   EGD FINDINGS:  Examination of the tubular esophagus revealed no mucosal  abnormalities.  The EG junction was easily traversed.   Stomach:  The gastric cavity was empty and insufflated well with air.  Thorough examination of the gastric mucosa including retroflex view in the  proximal stomach and esophagogastric junction demonstrated only a small  hiatal hernia.  The pylorus was patent and easily traversed.   Duodenum:  Examination of the bulb and second portion revealed no  abnormalities.   THERAPEUTIC/DIAGNOSTIC MANEUVERS PERFORMED:  None.   The patient tolerated the procedure well and was prepared for colonoscopy.   COLONOSCOPY FINDINGS:  Digital rectal examination revealed no abnormalities.   ENDOSCOPIC FINDINGS:  The prep was fair.   Rectum:  Examination of the rectal mucosa including retroflex view of the  anal verge revealed friable internal hemorrhoids which easily bled.   Colon:  The colonic mucosa was surveyed from the rectosigmoid junction  through the left, transverse, right colon to the area of the appendiceal  orifice, ileocecal valve, and cecum.  These structures were well-seen and  photographed for the record.  The patient had scattered left-sided  diverticula.  The remainder of the colonic mucosa appeared normal.  The  terminal ileum was intubated to 5 cm. This segment appeared normal as well.  From this level, the scope was slowly and cautiously withdrawn.  Kent  previously mentioned mucosal surfaces were again seen, and no other  abnormalities were observed.  The patient tolerated both procedures well and  was reactive in endoscopy.   EGD IMPRESSION:  Small hiatal hernia.  Otherwise, the upper gastrointestinal  tract appeared normal.   COLONOSCOPY IMPRESSION:  Friable internal hemorrhoids.  Otherwise, normal  rectum.  Scattered left-sided diverticula.  The remainder of the colonic  mucosa and terminal ileum appeared normal.  Recent CT scan negative.  She is  status post cholecystectomy. Will go ahead and check amylase, LFTs, and CBC  today and proceed with a small-bowel follow-through next week.  Further  recommendations to follow.   RECOMMENDATIONS:  1. Hemorrhoid literature provided to Connie Kent.  2. Anusol-HC suppositories, one per rectum at bedtime.                                               Jonathon Bellows, M.D.    RMR/MEDQ  D:  09/08/2002  T:  09/08/2002  Job:  [id number]   cc:   Barbaraann Barthel, M.D.  [address]  [place]  [place] [address]  Fax: [phone]

## 2010-09-01 ENCOUNTER — Encounter: Payer: Self-pay | Admitting: Family Medicine

## 2010-09-01 ENCOUNTER — Ambulatory Visit: Payer: Self-pay | Admitting: Family Medicine

## 2010-10-13 LAB — POCT CARDIAC MARKERS
CKMB, poc: 1.1
CKMB, poc: 1.3
Troponin i, poc: <0.05

## 2010-10-13 LAB — CBC
HCT: 39.9
Hemoglobin: 13.4
MCHC: 33.7
MCV: 89.9
RBC: 4.43
WBC: 8.6

## 2010-10-13 LAB — DIFFERENTIAL
Basophils Relative: 1
Eosinophils Absolute: 0
Lymphs Abs: 1.7
Monocytes Absolute: 0.6
Monocytes Relative: 7
Neutrophils Relative %: 72

## 2010-10-13 LAB — BASIC METABOLIC PANEL
CO2: 23
Chloride: 104
GFR calc Af Amer: >60
Potassium: 3.8
Sodium: 136

## 2011-01-29 ENCOUNTER — Ambulatory Visit: Payer: Self-pay | Admitting: Family Medicine

## 2012-03-24 ENCOUNTER — Encounter (HOSPITAL_COMMUNITY): Payer: Self-pay | Admitting: *Deleted

## 2012-03-24 ENCOUNTER — Emergency Department (HOSPITAL_COMMUNITY): Payer: Medicare Other

## 2012-03-24 ENCOUNTER — Observation Stay (HOSPITAL_COMMUNITY)
Admission: EM | Admit: 2012-03-24 | Discharge: 2012-03-25 | Disposition: A | Discharge status: Home / Self Care | Payer: Medicare Other | Attending: Internal Medicine | Admitting: Internal Medicine

## 2012-03-24 DIAGNOSIS — R079 Chest pain, unspecified: Secondary | ICD-10-CM | POA: Diagnosis present

## 2012-03-24 DIAGNOSIS — R0609 Other forms of dyspnea: Secondary | ICD-10-CM | POA: Insufficient documentation

## 2012-03-24 DIAGNOSIS — K219 Gastro-esophageal reflux disease without esophagitis: Secondary | ICD-10-CM | POA: Diagnosis present

## 2012-03-24 DIAGNOSIS — E785 Hyperlipidemia, unspecified: Secondary | ICD-10-CM

## 2012-03-24 DIAGNOSIS — R072 Precordial pain: Principal | ICD-10-CM | POA: Insufficient documentation

## 2012-03-24 DIAGNOSIS — I10 Essential (primary) hypertension: Secondary | ICD-10-CM | POA: Insufficient documentation

## 2012-03-24 DIAGNOSIS — E039 Hypothyroidism, unspecified: Secondary | ICD-10-CM | POA: Insufficient documentation

## 2012-03-24 DIAGNOSIS — R110 Nausea: Secondary | ICD-10-CM | POA: Insufficient documentation

## 2012-03-24 DIAGNOSIS — R0989 Other specified symptoms and signs involving the circulatory and respiratory systems: Secondary | ICD-10-CM | POA: Insufficient documentation

## 2012-03-24 HISTORY — DX: Disorder of thyroid, unspecified: E07.9

## 2012-03-24 HISTORY — DX: Essential (primary) hypertension: I10

## 2012-03-24 HISTORY — DX: Hyperlipidemia, unspecified: E78.5

## 2012-03-24 LAB — COMPREHENSIVE METABOLIC PANEL
AST: 24 U/L (ref 0–37)
Albumin: 4 g/dL (ref 3.5–5.2)
Alkaline Phosphatase: 76 U/L (ref 39–117)
CO2: 24 mEq/L (ref 19–32)
Chloride: 103 mEq/L (ref 96–112)
GFR calc non Af Amer: 90 mL/min — ABNORMAL LOW (ref >90)
Potassium: 3.8 mEq/L (ref 3.5–5.1)
Total Bilirubin: 0.4 mg/dL (ref 0.3–1.2)

## 2012-03-24 LAB — CBC WITH DIFFERENTIAL/PLATELET
Basophils Absolute: 0 10*3/uL (ref 0.0–0.1)
Basophils Relative: 0 % (ref 0–1)
HCT: 38.6 % (ref 36.0–46.0)
Hemoglobin: 13 g/dL (ref 12.0–15.0)
Lymphocytes Relative: 48 % — ABNORMAL HIGH (ref 12–46)
MCHC: 33.7 g/dL (ref 30.0–36.0)
Monocytes Relative: 8 % (ref 3–12)
Neutro Abs: 2.6 10*3/uL (ref 1.7–7.7)
Neutrophils Relative %: 42 % — ABNORMAL LOW (ref 43–77)
RDW: 13.1 % (ref 11.5–15.5)
WBC: 6.4 10*3/uL (ref 4.0–10.5)

## 2012-03-24 LAB — D-DIMER, QUANTITATIVE: D-Dimer, Quant: <0.27 ug/mL-FEU (ref 0.00–0.48)

## 2012-03-24 LAB — TROPONIN I: Troponin I: <0.3 ng/mL (ref <0.30)

## 2012-03-24 MED ORDER — GI COCKTAIL ~~LOC~~
30.0000 mL | Freq: Once | ORAL | Status: AC
  Administered 2012-03-24: 30 mL via ORAL
  Filled 2012-03-24: qty 30

## 2012-03-24 MED ORDER — MORPHINE SULFATE 2 MG/ML IJ SOLN: 1.0000 mg | INTRAMUSCULAR | Status: DC | PRN

## 2012-03-24 MED ORDER — ENOXAPARIN SODIUM 40 MG/0.4ML ~~LOC~~ SOLN
40.0000 mg | SUBCUTANEOUS | Status: DC
  Administered 2012-03-24: 40 mg via SUBCUTANEOUS
  Filled 2012-03-24: qty 0.4

## 2012-03-24 MED ORDER — METOPROLOL TARTRATE 25 MG PO TABS
25.0000 mg | ORAL_TABLET | Freq: Two times a day (BID) | ORAL | Status: DC
  Administered 2012-03-24 – 2012-03-25 (×2): 25 mg via ORAL
  Filled 2012-03-24 (×2): qty 1

## 2012-03-24 MED ORDER — NITROGLYCERIN 0.4 MG SL SUBL: 0.4000 mg | SUBLINGUAL_TABLET | SUBLINGUAL | Status: DC | PRN

## 2012-03-24 MED ORDER — SODIUM CHLORIDE 0.9 % IJ SOLN
3.0000 mL | Freq: Two times a day (BID) | INTRAMUSCULAR | Status: DC
  Administered 2012-03-24 – 2012-03-25 (×2): 3 mL via INTRAVENOUS
  Filled 2012-03-24: qty 3

## 2012-03-24 MED ORDER — ASPIRIN 81 MG PO CHEW
324.0000 mg | CHEWABLE_TABLET | Freq: Once | ORAL | Status: AC
  Administered 2012-03-24: 324 mg via ORAL
  Filled 2012-03-24: qty 4

## 2012-03-24 MED ORDER — ACETAMINOPHEN 650 MG RE SUPP: 650.0000 mg | Freq: Four times a day (QID) | RECTAL | Status: DC | PRN

## 2012-03-24 MED ORDER — ASPIRIN EC 81 MG PO TBEC
81.0000 mg | DELAYED_RELEASE_TABLET | Freq: Every day | ORAL | Status: DC
  Administered 2012-03-24 – 2012-03-25 (×2): 81 mg via ORAL
  Filled 2012-03-24 (×2): qty 1

## 2012-03-24 MED ORDER — FAMOTIDINE 20 MG PO TABS
20.0000 mg | ORAL_TABLET | Freq: Once | ORAL | Status: AC
  Administered 2012-03-24: 20 mg via ORAL
  Filled 2012-03-24: qty 1

## 2012-03-24 MED ORDER — ACETAMINOPHEN 325 MG PO TABS
650.0000 mg | ORAL_TABLET | Freq: Four times a day (QID) | ORAL | Status: DC | PRN
  Administered 2012-03-25: 650 mg via ORAL
  Filled 2012-03-24: qty 2

## 2012-03-24 NOTE — H&P (Signed)
Hospital Admission Note Date: 03/24/2012  Patient name: Connie Kent Medical record number: [id number] Date of birth: [birth year] Age: 67 y.o. Gender: female PCP: Kirstie Peri, MD  Attending physician: Christiane Ha, MD  Chief Complaint: Chest pain  History of Present Illness:  Connie Kent is an 67 y.o. female who presents to the emergency room with chest pain on and off since yesterday. It is located in the precordial area with a sharp component and heaviness. Associated with "fluttering", diaphoresis, dyspnea. She has felt it on and off for quite some time but it was much worse yesterday and today. She took aspirin which relieved her symptoms somewhat. She also notes that she's been more dyspneic on exertion recently. In reviewing the chart, she had a low risk Myoview in 2005 but no workup since then. She has a history of hypertension and previous tobacco use. She admits to smoking a cigarette once every 3 weeks or so but has largely cut back. The pain is not necessarily associated with activity. Usually lasts a few minutes but has been accelerating over the past 24 hours. When asked, she also admits to right-sided knee pain posteriorly. She's had no recent travel or history of thromboembolic disease. 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 Laboratory an EKG and chest x-ray are all unremarkable. Troponin was negative but d-dimer not done. Patient reports that her father had congestive heart failure. She has not had a TSH or lipids checked for quite some time. She has no pain currently. She reports that last week, she noticed that her chest hurt when she would lie on her left side, but it was relieved with lying on her right side.  Past Medical History  Diagnosis Date  . Hypertension   . Thyroid disease   . Hyperlipidemia    gastroesophageal reflux disease  Meds: Metoprolol 20 mg twice daily. Occasional aspirin use. Occasional Zantac  use.  Allergies: Review of patient's allergies indicates no known allergies. History   Social History  . Marital Status: Married    Spouse Name: N/A    Number of Children: N/A  . Years of Education: N/A   Occupational History  . Not on file.   Social History Main Topics  . Smoking status: Former Smoker    Types: Cigarettes  . Smokeless tobacco: Not on file  . Alcohol Use: No  . Drug Use: No  . Sexually Active: Not Currently   Other Topics Concern  . Not on file   Social History Narrative  . No narrative on file   Family History  Problem Relation Age of Onset  . Cancer Mother   . Heart failure Father    Past surgical history: Partial hysterectomy. Cholecystectomy. 2 breast biopsies which were reportedly benign  Review of Systems: Systems reviewed and as per HPI, otherwise negative.  Physical Exam: Blood pressure 147/62, pulse 82, temperature 98.1 F (36.7 C), temperature source Oral, resp. rate 15, height 5\' 1"  (1.549 m), weight 72.576 kg (160 lb), SpO2 97.00%. BP 147/62  Pulse 82  Temp(Src) 98.1 F (36.7 C) (Oral)  Resp 15  Ht 5\' 1"  (1.549 m)  Wt 72.576 kg (160 lb)  BMI 30.25 kg/m2  SpO2 97%  General Appearance:    Alert, cooperative, no distress, appears stated age.obese   Head:    Normocephalic, without obvious abnormality, atraumatic  Eyes:    PERRL, conjunctiva/corneas clear, EOM's intact, fundi    benign, both  eyes     Nose:   Nares normal, septum midline, mucosa normal, no drainage    or sinus tenderness  Throat:   Lips, mucosa, and tongue normal; teeth and gums normal  Neck:   Supple, symmetrical, trachea midline, no adenopathy;    thyroid:  no enlargement/tenderness/nodules; no carotid   bruit or JVD  Back:     Symmetric, no curvature, ROM normal, no CVA tenderness  Lungs:     Clear to auscultation bilaterally, respirations unlabored  Chest Wall:    she does have tenderness along the left chest area but reports that this is different from her  the pain that precipitated her coming to the emergency room.    Heart:    Regular rate and rhythm, S1 and S2 normal, no murmur, rub   or gallop     Abdomen:     Soft, non-tender, bowel sounds active all four quadrants,    no masses, no organomegaly  Genitalia:    Normal female without lesion, discharge or tenderness  Rectal:    Normal tone, normal prostate, no masses or tenderness;   guaiac negative stool  Extremities:  deferred   Pulses:  defered  Skin:   Skin color, texture, turgor normal, no rashes or lesions  Lymph nodes:   Cervical, supraclavicular, and axillary nodes normal  Neurologic:   CNII-XII intact, normal strength, sensation and reflexes    throughout    Psychiatric: Normal affect.  Lab results: Basic Metabolic Panel:  Recent Labs  16/10/96 1123  NA 139  K 3.8  CL 103  CO2 24  GLUCOSE 92  BUN 21  CREATININE 0.67  CALCIUM 9.3   Liver Function Tests:  Recent Labs  03/24/12 1123  AST 24  ALT 20  ALKPHOS 76  BILITOT 0.4  PROT 7.3  ALBUMIN 4.0   No results found for this basename: LIPASE, AMYLASE,  in the last 72 hours No results found for this basename: AMMONIA,  in the last 72 hours CBC:  Recent Labs  03/24/12 1123  WBC 6.4  NEUTROABS 2.6  HGB 13.0  HCT 38.6  MCV 90.4  PLT 238   Cardiac Enzymes: No results found for this basename: CKTOTAL, CKMB, CKMBINDEX, TROPONINI,  in the last 72 hours BNP:  Recent Labs  03/24/12 1123  PROBNP 42.4   EKG shows normal sinus rhythm  Imaging results:  Dg Chest 2 View  03/24/2012  *RADIOLOGY REPORT*  Clinical Data: Chest pain.  Abnormality on portable chest radiograph.  CHEST - 2 VIEW  Comparison: 03/24/2012 and 01/10/2008.  Findings: Trachea is midline.  Heart size normal.  Right paratracheal stripe is within normal limits.  Lungs are clear.  No pleural fluid.  IMPRESSION: No acute findings.   Original Report Authenticated By: Leanna Battles, M.D.    Dg Chest Portable 1 View  03/24/2012  *RADIOLOGY  REPORT*  Clinical Data: Chest pain.  Smoker.  PORTABLE CHEST - 1 VIEW  Comparison: Plain films of the chest 01/10/2008 and CT chest 06/20/2007.  Findings: There is fullness of the right paratracheal stripe which is new since the prior study.  Lungs appear clear.  Heart size is normal.  No pneumothorax or pleural effusion.  IMPRESSION: Fullness of the right paratracheal stripe could be due to AP technique.  Lymphadenopathy can cause this finding.  Repeat PA and lateral films are recommended.   Original Report Authenticated By: Holley Dexter, M.D.     Assessment & Plan: Chest pain: Somewhat atypical. Patient will be  placed on 24-hour observation. Continue aspirin daily. Rule out MI. Check d-dimer and if positive, rule out PE. Check fasting lipid panel and TSH. Continue metoprolol. Active Problems:   HYPERTENSION   HYPOTHYROIDISM   GERD  SULLIVAN,CORINNA L 03/24/2012, 3:39 PM

## 2012-03-24 NOTE — ED Notes (Signed)
The patient states that she started having chest pain yesterday.  States the pain is located in the center of her chest just over her breast bone.  States the pain radiates down her left arm.  Complains of nausea and shortness of breath.  States that her legs hurt as well.  States that she has been taking baby aspirin, states she took two 81 mg tablets today.

## 2012-03-24 NOTE — ED Notes (Signed)
MD at bedside. 

## 2012-03-24 NOTE — ED Provider Notes (Signed)
History     CSN: [id number]  Arrival date & time 03/24/12  1047   First MD Initiated Contact with Patient 03/24/12 1123      Chief Complaint  Patient presents with  . Chest Pain    (Consider location/radiation/quality/duration/timing/severity/associated sxs/prior treatment) HPI Comments: Patient is a 67 year old female with a history of hypertension, hyperlipidemia, and thyroid disease who presents to the emergency department with 2 days of stabbing, cutting, sharp chest pain. The patient states that this pain started on yesterday March 12 at approximately noon, or 1 PM. The pain as noted above sometimes headache stabbing sensation, at times felt like someone cutting, and at other times was sharp. This pain would last for 2-4 minute intervals and would come and go. The pain was not associated with shortness of breath, sweats, or vomiting. The pain was associated with nausea. There was no loss of consciousness. The patient denies any recent injury or trauma to the chest. There were no recent cold or flu-type symptoms. The patient denied any recent surgery, there's been no recent long trips or times of low activity for this patient. 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 The patient went home and ate her supper without any problem. Approximately 7:30 she said she was feeling bad and went to bed she did take however a baby aspirin. The patient states she had some similar pain throughout the night but again did not have shortness of breath loss of consciousness or unusual sweats.  This a.m. the patient states she fell over a bit better and 2 she went to work and at approximately 8:30 she began to have chest pain yet again. The pain had the same characteristics with at times feeling like cutting, at times feeling like something sharp in her chest. At this point the patient's supervisor" demanded" that the patient come to the hospital to be evaluated. The patient's risk factors  include hypertension and hyperlipidemia. Age 87. The patient is a former smoker but no longer smokes. The patient is not on any estrogen related medications. Patient has a history of acid reflux, but states that this does not feel exactly like the acid reflux discomfort she has had in the past. Patient has a primary care physician, Dr. Sherryll Burger.  Patient is a 67 y.o. female presenting with chest pain. The history is provided by the patient.  Chest Pain   Past Medical History  Diagnosis Date  . Hypertension   . Thyroid disease   . Hyperlipidemia     History reviewed. No pertinent past surgical history.  Family History  Problem Relation Age of Onset  . Cancer Mother   . Heart failure Father     History  Substance Use Topics  . Smoking status: Former Smoker    Types: Cigarettes  . Smokeless tobacco: Not on file  . Alcohol Use: No    OB History   Grav Para Term Preterm Abortions TAB SAB Ect Mult Living   5 4 4       4       Review of Systems  Cardiovascular: Positive for chest pain.  Gastrointestinal:       Reflux  Musculoskeletal: Positive for arthralgias.    Allergies  Review of patient's allergies indicates no known allergies.  Home Medications   Current Outpatient Rx  Name  Route  Sig  Dispense  Refill  . metoprolol tartrate (LOPRESSOR) 25 MG tablet   Oral   Take 25  mg by mouth 2 (two) times daily.           BP 155/67  Pulse 84  Temp(Src) 98.3 F (36.8 C) (Oral)  Ht 5\' 1"  (1.549 m)  Wt 160 lb (72.576 kg)  BMI 30.25 kg/m2  SpO2 97%  Physical Exam  Nursing note and vitals reviewed. Constitutional: She is oriented to person, place, and time. She appears well-developed and well-nourished.  Non-toxic appearance.  HENT:  Head: Normocephalic.  Right Ear: Tympanic membrane and external ear normal.  Left Ear: Tympanic membrane and external ear normal.  Eyes: EOM and lids are normal. Pupils are equal, round, and reactive to light.  Neck: Normal range of  motion. Neck supple. Carotid bruit is not present.  Cardiovascular: Normal rate, regular rhythm, normal heart sounds, intact distal pulses and normal pulses.   Pulmonary/Chest: Breath sounds normal. No respiratory distress.  Abdominal: Soft. Bowel sounds are normal. There is no tenderness. There is no guarding.  Musculoskeletal: Normal range of motion.  Lymphadenopathy:       Head (right side): No submandibular adenopathy present.       Head (left side): No submandibular adenopathy present.    She has no cervical adenopathy.  Neurological: She is alert and oriented to person, place, and time. She has normal strength. No cranial nerve deficit or sensory deficit.  Skin: Skin is warm and dry.  Psychiatric: She has a normal mood and affect. Her speech is normal.    ED Course  Procedures (including critical care time)  Labs Reviewed  CBC WITH DIFFERENTIAL - Abnormal; Notable for the following:    Neutrophils Relative 42 (*)    Lymphocytes Relative 48 (*)    All other components within normal limits  COMPREHENSIVE METABOLIC PANEL  PRO B NATRIURETIC PEPTIDE  POCT I-STAT TROPONIN I   No results found.   No diagnosis found.    MDM  I have reviewed nursing notes, vital signs, and all appropriate lab and imaging results for this patient. The patient presented to the emergency department with nearly 2 days of pain in the anterior chest that at times felt like a stabbing pain and at other times felt like a cutting a sharp type pain. The pain was not associated with loss of consciousness or shortness of breath or even diaphoresis. It was associated with some nausea. On the patient took baby aspirin on yesterday with some relief. She took she had aspirin in her in the emergency department and again receive some relief from this pain. She also receives some relief from GI cocktail and Pepcid.  The electrocardiogram is negative for acute event. The troponin is negative for acute event. The nature  peptide is negative for acute event. The electrolytes are stable. The patient has been on telemetry since being here in the emergency department, and there have been no life-threatening arrhythmias. There's been A no hypoxia noted on pulse oximetry. The results have been discussed with the patient and her husband.  The patient has been seen with me by Dr. Clarene Duke. Case was discussed with Dr. Lendell Caprice and she will see the patient for admission.       Kathie Dike, PA-C 03/24/12 1507

## 2012-03-24 NOTE — ED Notes (Signed)
RN at bedside

## 2012-03-24 NOTE — ED Notes (Signed)
Began w/chest pain, stabbing quality 2 days ago. Some nausea and lightheadedness. No SOB or diaphoresis. No increased belching. Numbness in hands/fingers.  Has been taking baby ASA.  Today has taken 2.

## 2012-03-25 DIAGNOSIS — E785 Hyperlipidemia, unspecified: Secondary | ICD-10-CM

## 2012-03-25 LAB — LIPID PANEL
Cholesterol: 187 mg/dL (ref 0–200)
HDL: 48 mg/dL (ref >39)
Triglycerides: 172 mg/dL — ABNORMAL HIGH (ref <150)
VLDL: 34 mg/dL (ref 0–40)

## 2012-03-25 MED ORDER — IBUPROFEN 200 MG PO TABS: 600.0000 mg | ORAL_TABLET | Freq: Three times a day (TID) | ORAL | Status: DC

## 2012-03-25 NOTE — Progress Notes (Signed)
UR Chart Review Completed  

## 2012-03-25 NOTE — Discharge Summary (Signed)
Physician Discharge Summary  Connie Kent:[id number] DOB: 1945-05-28 DOA: 03/24/2012  PCP: Kirstie Peri, MD  Admit date: 03/24/2012 Discharge date: 03/25/2012  Time spent: 20 minutes  Recommendations for Outpatient Follow-up:  1. Patient is being advised to take Motrin 600 mg every 8 hours x2 days scheduled  Discharge Diagnoses:  Principal Problem:   Chest pain, precordial Active Problems:   HYPOTHYROIDISM   HYPERTENSION   GERD   Discharge Condition: Improved, being discharged home  Diet recommendation: Low-sodium  Filed Weights   03/24/12 1055 03/24/12 1805  Weight: 72.576 kg (160 lb) 70.308 kg (155 lb)    History of present illness:  Connie Kent is an 67 y.o. female who presents to the emergency room with chest pain on and off since yesterday. It is located in the precordial area with a sharp component and heaviness. Associated with "fluttering", diaphoresis, dyspnea. She has felt it on and off for quite some time but it was much worse yesterday and today. She took aspirin which relieved her symptoms somewhat. She also notes that she's been more dyspneic on exertion recently. In reviewing the chart, she had a low risk Myoview in 2005 but no workup since then. She has a history of hypertension and previous tobacco use. She admits to smoking a cigarette once every 3 weeks or so but has largely cut back. The pain is not necessarily associated with activity. Usually lasts a few minutes but has been accelerating over the past 24 hours. When asked, she also admits to right-sided knee pain posteriorly. She's had no recent travel or history of thromboembolic disease. 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 Laboratory an EKG and chest x-ray are all unremarkable. Troponin was negative but d-dimer not done. Patient reports that her father had congestive heart failure. She has not had a TSH or lipids checked for quite some time. She has no pain  currently. She reports that last week, she noticed that her chest hurt when she would lie on her left side, but it was relieved with lying on her right side.   Hospital Course:  Principal Problem:   Chest pain, precordial: Enzymes and EKG normal. Vital signs stable. Pain atypical and suspected this is costochondritis, given chest wall tenderness. Treat with 2 days as scheduled anti-inflammatories. Followup with PCP in one month.  Active Problems:   HYPOTHYROIDISM: TSH checked and have been normal. Patient is not on Synthroid. Suspect she may thought she had hypothyroidism, but does not    HYPERTENSION: Stable. Continue beta blocker   GERD: Stable   Procedures:  None  Consultations:  None  Discharge Exam: Filed Vitals:   03/24/12 1400 03/24/12 1805 03/24/12 2102 03/25/12 0445  BP: 147/62 145/76 137/75 138/73  Pulse: 82 78 87 77  Temp:  97.5 F (36.4 C) 97.5 F (36.4 C) 97.6 F (36.4 C)  TempSrc:  Oral Oral Oral  Resp: 15  18 16   Height:  5\' 1"  (1.549 m)    Weight:  70.308 kg (155 lb)    SpO2: 97%  96% 96%    General: Alert and oriented x3, no acute distress Cardiovascular: Regular rate and rhythm, S1-S2, noted bilateral chest wall tenderness Respiratory: Clear to auscultation bilaterally Abdomen: Soft, nontender, nondistended, positive bowel sounds Extremities: No clubbing or cyanosis or edema  Discharge Instructions  Discharge Orders   Future Orders Complete By Expires     Diet - low sodium heart healthy  As directed     Increase activity slowly  As directed         Medication List    TAKE these medications       metoprolol tartrate 25 MG tablet  Commonly known as:  LOPRESSOR  Take 25 mg by mouth 2 (two) times daily.           Follow-up Information   Follow up with [facility] Bledsoe, MD. Schedule an appointment as soon as possible for a visit in 1 month.   Contact information:   [address]  [place] [address] [phone]        The results of  significant diagnostics from this hospitalization (including imaging, microbiology, ancillary and laboratory) are listed below for reference.    Significant Diagnostic Studies: Dg Chest 2 View  03/24/2012  .  IMPRESSION: No acute findings.   Original Report Authenticated By: Leanna Battles, M.D.    Dg Chest Portable 1 View  03/24/2012    IMPRESSION: Fullness of the right paratracheal stripe could be due to AP technique.  Lymphadenopathy can cause this finding.  Repeat PA and lateral films are recommended.   Original Report Authenticated By: Holley Dexter, M.D.       Labs: Basic Metabolic Panel:  Recent Labs Lab 03/24/12 1123  NA 139  K 3.8  CL 103  CO2 24  GLUCOSE 92  BUN 21  CREATININE 0.67  CALCIUM 9.3   Liver Function Tests:  Recent Labs Lab 03/24/12 1123  AST 24  ALT 20  ALKPHOS 76  BILITOT 0.4  PROT 7.3  ALBUMIN 4.0   CBC:  Recent Labs Lab 03/24/12 1123  WBC 6.4  NEUTROABS 2.6  HGB 13.0  HCT 38.6  MCV 90.4  PLT 238   Cardiac Enzymes:  Recent Labs Lab 03/24/12 1600  TROPONINI <0.30   BNP: BNP (last 3 results)  Recent Labs  03/24/12 1123  PROBNP 42.4    Signed:  KRISHNAN,SENDIL K  [facility] 03/25/2012, 10:39 AM

## 2012-03-25 NOTE — Progress Notes (Signed)
Pt discharged home today per Dr. Rito Ehrlich. Pt's IV site D/C'd and WNL. Pt's VS stable at this time. Pt provided with home medication list, discharge instructions and prescriptions called into [facility] at [facility]. Verbalized understanding. Pt left floor in stable condition accompanied by RN.

## 2012-03-26 NOTE — ED Provider Notes (Signed)
Medical screening examination/treatment/procedure(s) were performed by non-physician practitioner and as supervising physician I was immediately available for consultation/collaboration.   Laray Anger, DO 03/26/12 1451

## 2013-05-14 ENCOUNTER — Emergency Department (HOSPITAL_COMMUNITY)
Admission: EM | Admit: 2013-05-14 | Discharge: 2013-05-14 | Disposition: A | Discharge status: Home / Self Care | Payer: Medicare Other | Attending: Emergency Medicine | Admitting: Emergency Medicine

## 2013-05-14 ENCOUNTER — Encounter (HOSPITAL_COMMUNITY): Payer: Self-pay | Admitting: Emergency Medicine

## 2013-05-14 ENCOUNTER — Emergency Department (HOSPITAL_COMMUNITY): Payer: Medicare Other

## 2013-05-14 DIAGNOSIS — R0789 Other chest pain: Secondary | ICD-10-CM | POA: Insufficient documentation

## 2013-05-14 DIAGNOSIS — E669 Obesity, unspecified: Secondary | ICD-10-CM | POA: Insufficient documentation

## 2013-05-14 DIAGNOSIS — Z862 Personal history of diseases of the blood and blood-forming organs and certain disorders involving the immune mechanism: Secondary | ICD-10-CM | POA: Insufficient documentation

## 2013-05-14 DIAGNOSIS — Z8639 Personal history of other endocrine, nutritional and metabolic disease: Secondary | ICD-10-CM | POA: Insufficient documentation

## 2013-05-14 DIAGNOSIS — Z87891 Personal history of nicotine dependence: Secondary | ICD-10-CM | POA: Insufficient documentation

## 2013-05-14 DIAGNOSIS — I10 Essential (primary) hypertension: Secondary | ICD-10-CM | POA: Insufficient documentation

## 2013-05-14 DIAGNOSIS — Z79899 Other long term (current) drug therapy: Secondary | ICD-10-CM | POA: Insufficient documentation

## 2013-05-14 DIAGNOSIS — R079 Chest pain, unspecified: Secondary | ICD-10-CM

## 2013-05-14 LAB — CBC WITH DIFFERENTIAL/PLATELET
BASOS ABS: 0 10*3/uL (ref 0.0–0.1)
Basophils Relative: 0 % (ref 0–1)
EOS PCT: 3 % (ref 0–5)
Eosinophils Absolute: 0.1 10*3/uL (ref 0.0–0.7)
HCT: 39.9 % (ref 36.0–46.0)
Hemoglobin: 13.4 g/dL (ref 12.0–15.0)
LYMPHS ABS: 2.1 10*3/uL (ref 0.7–4.0)
LYMPHS PCT: 40 % (ref 12–46)
MCH: 30.7 pg (ref 26.0–34.0)
MCHC: 33.6 g/dL (ref 30.0–36.0)
MCV: 91.3 fL (ref 78.0–100.0)
Monocytes Absolute: 0.5 10*3/uL (ref 0.1–1.0)
Monocytes Relative: 10 % (ref 3–12)
NEUTROS ABS: 2.4 10*3/uL (ref 1.7–7.7)
NEUTROS PCT: 47 % (ref 43–77)
PLATELETS: 227 10*3/uL (ref 150–400)
RBC: 4.37 MIL/uL (ref 3.87–5.11)
RDW: 13.7 % (ref 11.5–15.5)
WBC: 5.3 10*3/uL (ref 4.0–10.5)

## 2013-05-14 LAB — BASIC METABOLIC PANEL
BUN: 17 mg/dL (ref 6–23)
CHLORIDE: 104 meq/L (ref 96–112)
CO2: 23 meq/L (ref 19–32)
Calcium: 9.1 mg/dL (ref 8.4–10.5)
Creatinine, Ser: 0.68 mg/dL (ref 0.50–1.10)
GFR calc Af Amer: >90 mL/min (ref >90)
GFR calc non Af Amer: 89 mL/min — ABNORMAL LOW (ref >90)
GLUCOSE: 109 mg/dL — AB (ref 70–99)
POTASSIUM: 4.1 meq/L (ref 3.7–5.3)
SODIUM: 139 meq/L (ref 137–147)

## 2013-05-14 LAB — TROPONIN I: Troponin I: <0.3 ng/mL (ref <0.30)

## 2013-05-14 MED ORDER — HYDROCHLOROTHIAZIDE 25 MG PO TABS: 25.0000 mg | ORAL_TABLET | Freq: Every day | ORAL | Status: DC

## 2013-05-14 NOTE — ED Notes (Signed)
Pt c/o chest pain since yesterday and sob.  Says yesterday had severe chest pain and took 2 baby aspirin.  Says pain eased off.  Pt says has had productive cough.  Denies fever.

## 2013-05-14 NOTE — ED Notes (Signed)
Pt received discharge instructions and prescriptions, verbalized understanding and has no further questions. Pt ambulated to exit in stable condition accompanied by family.  Advised to return to emergency department with new or worsening symptoms.  

## 2013-05-14 NOTE — Discharge Instructions (Signed)
Chest Pain (Nonspecific) °It is often hard to give a specific diagnosis for the cause of chest pain. There is always a chance that your pain could be related to something serious, such as a heart attack or a blood clot in the lungs. You need to follow up with your caregiver for further evaluation. °CAUSES  °· Heartburn. °· Pneumonia or bronchitis. °· Anxiety or stress. °· Inflammation around your heart (pericarditis) or lung (pleuritis or pleurisy). °· A blood clot in the lung. °· A collapsed lung (pneumothorax). It can develop suddenly on its own (spontaneous pneumothorax) or from injury (trauma) to the chest. °· Shingles infection (herpes zoster virus). °The chest wall is composed of bones, muscles, and cartilage. Any of these can be the source of the pain. °· The bones can be bruised by injury. °· The muscles or cartilage can be strained by coughing or overwork. °· The cartilage can be affected by inflammation and become sore (costochondritis). °DIAGNOSIS  °Lab tests or other studies, such as X-rays, electrocardiography, stress testing, or cardiac imaging, may be needed to find the cause of your pain.  °TREATMENT  °· Treatment depends on what may be causing your chest pain. Treatment may include: °· Acid blockers for heartburn. °· Anti-inflammatory medicine. °· Pain medicine for inflammatory conditions. °· Antibiotics if an infection is present. °· You may be advised to change lifestyle habits. This includes stopping smoking and avoiding alcohol, caffeine, and chocolate. °· You may be advised to keep your head raised (elevated) when sleeping. This reduces the chance of acid going backward from your stomach into your esophagus. °· Most of the time, nonspecific chest pain will improve within 2 to 3 days with rest and mild pain medicine. °HOME CARE INSTRUCTIONS  °· If antibiotics were prescribed, take your antibiotics as directed. Finish them even if you start to feel better. °· For the next few days, avoid physical  activities that bring on chest pain. Continue physical activities as directed. °· Do not smoke. °· Avoid drinking alcohol. °· Only take over-the-counter or prescription medicine for pain, discomfort, or fever as directed by your caregiver. °· Follow your caregiver's suggestions for further testing if your chest pain does not go away. °· Keep any follow-up appointments you made. If you do not go to an appointment, you could develop lasting (chronic) problems with pain. If there is any problem keeping an appointment, you must call to reschedule. °SEEK MEDICAL CARE IF:  °· You think you are having problems from the medicine you are taking. Read your medicine instructions carefully. °· Your chest pain does not go away, even after treatment. °· You develop a rash with blisters on your chest. °SEEK IMMEDIATE MEDICAL CARE IF:  °· You have increased chest pain or pain that spreads to your arm, neck, jaw, back, or abdomen. °· You develop shortness of breath, an increasing cough, or you are coughing up blood. °· You have severe back or abdominal pain, feel nauseous, or vomit. °· You develop severe weakness, fainting, or chills. °· You have a fever. °THIS IS AN EMERGENCY. Do not wait to see if the pain will go away. Get medical help at once. Call your local emergency services (911 in [place] Do not drive yourself to the hospital. °MAKE SURE YOU:  °· Understand these instructions. °· Will watch your condition. °· Will get help right away if you are not doing well or get worse. °Document Released: 10/08/2004 Document Revised: 03/23/2011 Document Reviewed: 08/04/2007 °Exit[employer]® Patient Information ©2014 [employer]   LL[employer].  Hypertension As your heart beats, it forces blood through your arteries. This force is your blood pressure. If the pressure is too high, it is called hypertension (HTN) or high blood pressure. HTN is dangerous because you may have it and not know it. High blood pressure may mean that your heart has to work harder  to pump blood. Your arteries may be narrow or stiff. The extra work puts you at risk for heart disease, stroke, and other problems.  Blood pressure consists of two numbers, a higher number over a lower, 110/72, for example. It is stated as "110 over 72." The ideal is below 120 for the top number (systolic) and under 80 for the bottom (diastolic). Write down your blood pressure today. You should pay close attention to your blood pressure if you have certain conditions such as:  Heart failure.  Prior heart attack.  Diabetes  Chronic kidney disease.  Prior stroke.  Multiple risk factors for heart disease. To see if you have HTN, your blood pressure should be measured while you are seated with your arm held at the level of the heart. It should be measured at least twice. A one-time elevated blood pressure reading (especially in the Emergency Department) does not mean that you need treatment. There may be conditions in which the blood pressure is different between your right and left arms. It is important to see your caregiver soon for a recheck. Most people have essential hypertension which means that there is not a specific cause. This type of high blood pressure may be lowered by changing lifestyle factors such as:  Stress.  Smoking.  Lack of exercise.  Excessive weight.  Drug/tobacco/alcohol use.  Eating less salt. Most people do not have symptoms from high blood pressure until it has caused damage to the body. Effective treatment can often prevent, delay or reduce that damage. TREATMENT  When a cause has been identified, treatment for high blood pressure is directed at the cause. There are a large number of medications to treat HTN. These fall into several categories, and your caregiver will help you select the medicines that are best for you. Medications may have side effects. You should review side effects with your caregiver. If your blood pressure stays high after you have made  lifestyle changes or started on medicines,   Your medication(s) may need to be changed.  Other problems may need to be addressed.  Be certain you understand your prescriptions, and know how and when to take your medicine.  Be sure to follow up with your caregiver within the time frame advised (usually within two weeks) to have your blood pressure rechecked and to review your medications.  If you are taking more than one medicine to lower your blood pressure, make sure you know how and at what times they should be taken. Taking two medicines at the same time can result in blood pressure that is too low. SEEK IMMEDIATE MEDICAL CARE IF:  You develop a severe headache, blurred or changing vision, or confusion.  You have unusual weakness or numbness, or a faint feeling.  You have severe chest or abdominal pain, vomiting, or breathing problems. MAKE SURE YOU:   Understand these instructions.  Will watch your condition.  Will get help right away if you are not doing well or get worse. Document Released: 12/29/2004 Document Revised: 03/23/2011 Document Reviewed: 08/19/2007 [facility] Patient Information 2014 [place].

## 2013-05-16 NOTE — ED Provider Notes (Signed)
CSN: [id number]     Arrival date & time 05/14/13  1123 History   First MD Initiated Contact with Patient 05/14/13 1240     Chief Complaint  Patient presents with  . Chest Pain     (Consider location/radiation/quality/duration/timing/severity/associated sxs/prior Treatment) Patient is a 68 y.o. female presenting with chest pain.  Chest Pain Associated symptoms: shortness of breath   Associated symptoms: no abdominal pain, no back pain, no headache, no nausea, no numbness, not vomiting and no weakness    Patient has episodes of chest pain. She descrbes it as pressure. Comes and goes. No hemotpysis. simmilar to previous episodes. Increasing dyspnea with exertion. Recent admission with simmilar symptoms. Pain free now. Pain does not come on with exertion.  Past Medical History  Diagnosis Date  . Hypertension   . Thyroid disease   . Hyperlipidemia    Past Surgical History  Procedure Laterality Date  . Breast biopsies    . Abdominal hysterectomy    . Cholecystectomy    . Hemorrhoid surgery     Family History  Problem Relation Age of Onset  . Cancer Mother   . Heart failure Father    History  Substance Use Topics  . Smoking status: Former Smoker    Types: Cigarettes  . Smokeless tobacco: Not on file  . Alcohol Use: No   OB History   Grav Para Term Preterm Abortions TAB SAB Ect Mult Living   5 4 4       4      Review of Systems  Constitutional: Negative for activity change and appetite change.  Eyes: Negative for pain.  Respiratory: Positive for shortness of breath. Negative for chest tightness.   Cardiovascular: Positive for chest pain. Negative for leg swelling.  Gastrointestinal: Negative for nausea, vomiting, abdominal pain and diarrhea.  Genitourinary: Negative for flank pain.  Musculoskeletal: Negative for back pain and neck stiffness.  Skin: Negative for rash.  Neurological: Negative for weakness, numbness and headaches.  Psychiatric/Behavioral: Negative for  behavioral problems.      Allergies  Review of patient's allergies indicates no known allergies.  Home Medications   Prior to Admission medications   Medication Sig Start Date End Date Taking? Authorizing Provider  ibuprofen (ADVIL,MOTRIN) 200 MG tablet Take 600 mg by mouth every 6 (six) hours as needed for moderate pain.   Yes Historical Provider, MD  metoprolol tartrate (LOPRESSOR) 25 MG tablet Take 25 mg by mouth 2 (two) times daily.   Yes Historical Provider, MD  Multiple Vitamin (MULTIVITAMIN WITH MINERALS) TABS tablet Take 1 tablet by mouth daily.   Yes Historical Provider, MD  hydrochlorothiazide (HYDRODIURIL) 25 MG tablet Take 1 tablet (25 mg total) by mouth daily. 05/14/13   Jasper Riling. Frankey Botting, MD   BP 156/79  Pulse 86  Temp(Src) 97.9 F (36.6 C) (Oral)  Resp 20  Ht 5\' 1"  (1.549 m)  Wt 169 lb (76.658 kg)  BMI 31.95 kg/m2  SpO2 98% Physical Exam  Nursing note and vitals reviewed. Constitutional: She is oriented to person, place, and time. She appears well-developed and well-nourished.  obese  HENT:  Head: Normocephalic and atraumatic.  Eyes: EOM are normal. Pupils are equal, round, and reactive to light.  Neck: Normal range of motion. Neck supple.  Cardiovascular: Normal rate, regular rhythm and normal heart sounds.   No murmur heard. Pulmonary/Chest: Effort normal. No respiratory distress. She has no wheezes. She has no rales.  Mildly harsh breath sounds  Abdominal: Soft. Bowel sounds are normal.  She exhibits no distension. There is no tenderness. There is no rebound and no guarding.  Musculoskeletal: Normal range of motion. She exhibits edema.  Neurological: She is alert and oriented to person, place, and time. No cranial nerve deficit.  Skin: Skin is warm and dry.  Psychiatric: She has a normal mood and affect. Her speech is normal.    ED Course  Procedures (including critical care time) Labs Review Labs Reviewed  BASIC METABOLIC PANEL - Abnormal; Notable  for the following:    Glucose, Bld 109 (*)    GFR calc non Af Amer 89 (*)    All other components within normal limits  CBC WITH DIFFERENTIAL  TROPONIN I    Imaging Review No results found.   EKG Interpretation   Date/Time:  Sunday May 14 2013 11:40:44 EDT Ventricular Rate:  94 PR Interval:  150 QRS Duration: 80 QT Interval:  384 QTC Calculation: 480 R Axis:   38 Text Interpretation:  Normal sinus rhythm Normal ECG Confirmed by  Alvino Chapel  MD, Ovid Curd [phone]) on 05/14/2013 12:42:14 PM      MDM   Final diagnoses:  Chest pain  Hypertension    Patient with chest pain and hypertension. EKG and labwork reassuring. Has HTN and will add another agent. PCP and cards follow up and may need stress test.    Jasper Riling. Alvino Chapel, MD 05/16/13 [phone]

## 2013-05-25 ENCOUNTER — Telehealth: Payer: Self-pay | Admitting: Family Medicine

## 2013-05-26 NOTE — Telephone Encounter (Signed)
Pt and husband aware that we currently can not take on New pts - but to call back in approx 1 mo and we will try and arrange this.

## 2013-07-26 ENCOUNTER — Encounter (INDEPENDENT_AMBULATORY_CARE_PROVIDER_SITE_OTHER): Payer: Self-pay

## 2013-07-26 ENCOUNTER — Ambulatory Visit (INDEPENDENT_AMBULATORY_CARE_PROVIDER_SITE_OTHER): Payer: Commercial Managed Care - HMO | Admitting: Family

## 2013-07-26 ENCOUNTER — Encounter: Payer: Self-pay | Admitting: Family

## 2013-07-26 VITALS — BP 141/81 | HR 81 | Temp 97.6°F | Resp 18 | Ht 61.0 in | Wt 166.0 lb

## 2013-07-26 DIAGNOSIS — R109 Unspecified abdominal pain: Secondary | ICD-10-CM

## 2013-07-26 DIAGNOSIS — I10 Essential (primary) hypertension: Secondary | ICD-10-CM

## 2013-07-26 DIAGNOSIS — E785 Hyperlipidemia, unspecified: Secondary | ICD-10-CM

## 2013-07-26 DIAGNOSIS — E039 Hypothyroidism, unspecified: Secondary | ICD-10-CM

## 2013-07-26 DIAGNOSIS — R1010 Upper abdominal pain, unspecified: Secondary | ICD-10-CM

## 2013-07-26 DIAGNOSIS — Z23 Encounter for immunization: Secondary | ICD-10-CM

## 2013-07-26 DIAGNOSIS — K219 Gastro-esophageal reflux disease without esophagitis: Secondary | ICD-10-CM

## 2013-07-26 DIAGNOSIS — Z1321 Encounter for screening for nutritional disorder: Secondary | ICD-10-CM

## 2013-07-26 LAB — POCT URINALYSIS DIPSTICK
BILIRUBIN UA: NEGATIVE
Blood, UA: NEGATIVE
GLUCOSE UA: NEGATIVE
KETONES UA: NEGATIVE
Leukocytes, UA: NEGATIVE
NITRITE UA: NEGATIVE
PH UA: 7
Protein, UA: NEGATIVE
SPEC GRAV UA: 1.01
Urobilinogen, UA: NEGATIVE

## 2013-07-26 LAB — POCT UA - MICROSCOPIC ONLY
Casts, Ur, LPF, POC: NEGATIVE
Crystals, Ur, HPF, POC: NEGATIVE
Mucus, UA: NEGATIVE
Yeast, UA: NEGATIVE

## 2013-07-26 MED ORDER — LEVOTHYROXINE SODIUM 25 MCG PO TABS: 25.0000 ug | ORAL_TABLET | Freq: Every day | ORAL | Status: DC

## 2013-07-26 MED ORDER — HYDROCHLOROTHIAZIDE 25 MG PO TABS: 25.0000 mg | ORAL_TABLET | Freq: Every day | ORAL | Status: DC

## 2013-07-26 MED ORDER — OMEPRAZOLE 40 MG PO CPDR: 40.0000 mg | DELAYED_RELEASE_CAPSULE | Freq: Every day | ORAL | Status: DC

## 2013-07-26 NOTE — Patient Instructions (Signed)

## 2013-07-26 NOTE — Progress Notes (Signed)
Subjective:    Patient ID: Connie Kent, female    DOB: 08-22-1945, 68 y.o.   MRN: [id number]  Pt presents to office to establish care. Pt has the following chronic conditions: Hypertension This is a chronic problem. The current episode started more than 1 year ago. The problem has been waxing and waning since onset. The problem is uncontrolled. Associated symptoms include shortness of breath (At times). Pertinent negatives include no anxiety, chest pain, headaches, palpitations or peripheral edema. Risk factors for coronary artery disease include obesity and post-menopausal state. Past treatments include beta blockers and diuretics. The current treatment provides mild improvement. Hypertensive end-organ damage includes a thyroid problem. There is no history of kidney disease, CAD/MI or heart failure. There is no history of sleep apnea.  Thyroid Problem Presents for follow-up visit. Symptoms include hair loss. Patient reports no anxiety, constipation, diarrhea, dry skin, palpitations or visual change. The symptoms have been improving. Past treatments include levothyroxine. The treatment provided moderate relief. There is no history of heart failure.  Gastrophageal Reflux She complains of abdominal pain. She reports no chest pain, no dysphagia, no heartburn or no sore throat. This is a chronic problem. The current episode started more than 1 year ago. The problem occurs frequently. The problem has been waxing and waning. The symptoms are aggravated by certain foods and lying down. Risk factors include obesity. She has tried a PPI for the symptoms. The treatment provided moderate relief.  Abdominal Pain This is a new problem. The current episode started in the past 7 days (Last 4 days). The onset quality is gradual. The problem occurs intermittently. The problem has been waxing and waning. The pain is located in the RUQ and LUQ. The pain is at a severity of 6/10. The pain is moderate. The quality of  the pain is dull and burning. Pertinent negatives include no constipation, diarrhea or headaches. The pain is aggravated by palpation and certain positions. She has tried H2 blockers for the symptoms. The treatment provided mild relief. Her past medical history is significant for GERD.   *PT states she has been told in the past that she has high cholesterol, but has not ever been given any medications. Pt states she had gallbladder and appendix removed in the past.   Review of Systems  Constitutional: Negative.   HENT: Negative.  Negative for sore throat.   Eyes: Negative.   Respiratory: Positive for shortness of breath (At times).   Cardiovascular: Negative.  Negative for chest pain and palpitations.  Gastrointestinal: Positive for abdominal pain. Negative for heartburn, dysphagia, diarrhea and constipation.  Endocrine: Negative.   Genitourinary: Negative.   Musculoskeletal: Negative.   Neurological: Negative.  Negative for headaches.  Hematological: Negative.   Psychiatric/Behavioral: Negative.   All other systems reviewed and are negative.      Objective:   Physical Exam  Vitals reviewed. Constitutional: She is oriented to person, place, and time. She appears well-developed and well-nourished. No distress.  HENT:  Head: Normocephalic and atraumatic.  Right Ear: External ear normal.  Mouth/Throat: Oropharynx is clear and moist.  Eyes: Pupils are equal, round, and reactive to light.  Neck: Normal range of motion. Neck supple. No thyromegaly present.  Cardiovascular: Normal rate, regular rhythm, normal heart sounds and intact distal pulses.   No murmur heard. Pulmonary/Chest: Effort normal and breath sounds normal. No respiratory distress. She has no wheezes.  Abdominal: Soft. Bowel sounds are normal. She exhibits no distension. There is tenderness. There is rebound and  guarding.  Musculoskeletal: Normal range of motion. She exhibits no edema and no tenderness.  Neurological: She  is alert and oriented to person, place, and time. She has normal reflexes. No cranial nerve deficit.  Skin: Skin is warm and dry.  Psychiatric: She has a normal mood and affect. Her behavior is normal. Judgment and thought content normal.     BP 141/81  Pulse 81  Temp(Src) 97.6 F (36.4 C) (Oral)  Resp 18  Ht 5' 1"  (1.549 m)  Wt 166 lb (75.297 kg)  BMI 31.38 kg/m2      Assessment & Plan:  1. HYPERTENSION - CMP14+EGFR - hydrochlorothiazide (HYDRODIURIL) 25 MG tablet; Take 1 tablet (25 mg total) by mouth daily.  Dispense: 90 tablet; Refill: 3  2. Gastroesophageal reflux disease without esophagitis - omeprazole (PRILOSEC) 40 MG capsule; Take 1 capsule (40 mg total) by mouth daily.  Dispense: 30 capsule; Refill: 3  3. HYPOTHYROIDISM - Thyroid Panel With TSH - levothyroxine (SYNTHROID, LEVOTHROID) 25 MCG tablet; Take 1 tablet (25 mcg total) by mouth daily before breakfast.  Dispense: 90 tablet; Refill: 3  4. Encounter for vitamin deficiency screening - Vit D  25 hydroxy (rtn osteoporosis monitoring)  5. Hyperlipidemia - Lipid panel  6. Pain of upper abdomen -Started pt on omeprazole 40 mg daily -Will wait to see lab work- Decide if pt needs scan- Pt had gallbladder and appendix removed in past - Amylase - Lipase   Continue all meds Labs pending Health Maintenance reviewed-Mammogram scheduled, pneumonia vaccine given today hemoccult cards given to patient with directions Diet and exercise encouraged RTO 3 months  Evelina Dun, FNP

## 2013-07-27 LAB — CMP14+EGFR
ALBUMIN: 4.4 g/dL (ref 3.6–4.8)
ALT: 73 IU/L — ABNORMAL HIGH (ref 0–32)
AST: 62 IU/L — AB (ref 0–40)
Albumin/Globulin Ratio: 1.9 (ref 1.1–2.5)
Alkaline Phosphatase: 82 IU/L (ref 39–117)
BILIRUBIN TOTAL: 0.4 mg/dL (ref 0.0–1.2)
BUN / CREAT RATIO: 27 — AB (ref 11–26)
BUN: 18 mg/dL (ref 8–27)
CO2: 26 mmol/L (ref 18–29)
CREATININE: 0.66 mg/dL (ref 0.57–1.00)
Calcium: 9.8 mg/dL (ref 8.7–10.3)
Chloride: 98 mmol/L (ref 97–108)
GFR calc Af Amer: 106 mL/min/{1.73_m2} (ref >59)
GFR calc non Af Amer: 92 mL/min/{1.73_m2} (ref >59)
GLOBULIN, TOTAL: 2.3 g/dL (ref 1.5–4.5)
Glucose: 99 mg/dL (ref 65–99)
Potassium: 4.1 mmol/L (ref 3.5–5.2)
SODIUM: 139 mmol/L (ref 134–144)
Total Protein: 6.7 g/dL (ref 6.0–8.5)

## 2013-07-27 LAB — THYROID PANEL WITH TSH
Free Thyroxine Index: 1.3 (ref 1.2–4.9)
T3 UPTAKE RATIO: 24 % (ref 24–39)
T4 TOTAL: 5.4 ug/dL (ref 4.5–12.0)
TSH: 4.56 u[IU]/mL — AB (ref 0.450–4.500)

## 2013-07-27 LAB — LIPID PANEL
CHOL/HDL RATIO: 4.4 ratio (ref 0.0–4.4)
Cholesterol, Total: 186 mg/dL (ref 100–199)
HDL: 42 mg/dL (ref >39)
LDL CALC: 86 mg/dL (ref 0–99)
Triglycerides: 289 mg/dL — ABNORMAL HIGH (ref 0–149)
VLDL Cholesterol Cal: 58 mg/dL — ABNORMAL HIGH (ref 5–40)

## 2013-07-27 LAB — LIPASE: Lipase: 33 U/L (ref 0–59)

## 2013-07-27 LAB — AMYLASE: Amylase: 64 U/L (ref 31–124)

## 2013-07-27 LAB — VITAMIN D 25 HYDROXY (VIT D DEFICIENCY, FRACTURES): VIT D 25 HYDROXY: 34.5 ng/mL (ref 30.0–100.0)

## 2013-07-31 ENCOUNTER — Other Ambulatory Visit: Payer: Self-pay | Admitting: Family

## 2013-07-31 MED ORDER — LEVOTHYROXINE SODIUM 50 MCG PO TABS: 50.0000 ug | ORAL_TABLET | Freq: Every day | ORAL | Status: DC

## 2013-07-31 MED ORDER — NITROFURANTOIN MONOHYD MACRO 100 MG PO CAPS: 100.0000 mg | ORAL_CAPSULE | Freq: Two times a day (BID) | ORAL | Status: DC

## 2013-08-04 ENCOUNTER — Other Ambulatory Visit: Payer: Self-pay | Admitting: Family Medicine

## 2013-08-04 DIAGNOSIS — IMO0002 Reserved for concepts with insufficient information to code with codable children: Secondary | ICD-10-CM

## 2013-08-04 DIAGNOSIS — R229 Localized swelling, mass and lump, unspecified: Principal | ICD-10-CM

## 2013-08-15 ENCOUNTER — Other Ambulatory Visit: Payer: Self-pay | Admitting: Family Medicine

## 2013-08-15 ENCOUNTER — Ambulatory Visit (HOSPITAL_COMMUNITY)
Admission: RE | Admit: 2013-08-15 | Discharge: 2013-08-15 | Disposition: A | Discharge status: Home / Self Care | Payer: Medicare HMO | Source: Ambulatory Visit | Attending: Family Medicine | Admitting: Family Medicine

## 2013-08-15 DIAGNOSIS — N63 Unspecified lump in unspecified breast: Secondary | ICD-10-CM

## 2013-08-21 ENCOUNTER — Other Ambulatory Visit (INDEPENDENT_AMBULATORY_CARE_PROVIDER_SITE_OTHER): Payer: Commercial Managed Care - HMO

## 2013-08-21 DIAGNOSIS — R7989 Other specified abnormal findings of blood chemistry: Secondary | ICD-10-CM

## 2013-08-22 LAB — HEPATIC FUNCTION PANEL
ALBUMIN: 4.4 g/dL (ref 3.6–4.8)
ALK PHOS: 81 IU/L (ref 39–117)
ALT: 56 IU/L — AB (ref 0–32)
AST: 52 IU/L — AB (ref 0–40)
Bilirubin, Direct: 0.12 mg/dL (ref 0.00–0.40)
Total Bilirubin: 0.6 mg/dL (ref 0.0–1.2)
Total Protein: 7.1 g/dL (ref 6.0–8.5)

## 2013-08-29 ENCOUNTER — Telehealth: Payer: Self-pay | Admitting: Family

## 2013-08-29 DIAGNOSIS — I10 Essential (primary) hypertension: Secondary | ICD-10-CM

## 2013-08-29 MED ORDER — HYDROCHLOROTHIAZIDE 25 MG PO TABS: 25.0000 mg | ORAL_TABLET | Freq: Every day | ORAL | Status: DC

## 2013-08-29 MED ORDER — ATENOLOL 50 MG PO TABS: 50.0000 mg | ORAL_TABLET | Freq: Every day | ORAL | Status: DC

## 2013-08-29 MED ORDER — LEVOTHYROXINE SODIUM 50 MCG PO TABS: 50.0000 ug | ORAL_TABLET | Freq: Every day | ORAL | Status: DC

## 2013-08-29 NOTE — Telephone Encounter (Signed)
done

## 2013-09-08 ENCOUNTER — Ambulatory Visit (INDEPENDENT_AMBULATORY_CARE_PROVIDER_SITE_OTHER): Payer: Commercial Managed Care - HMO | Admitting: Family

## 2013-09-08 ENCOUNTER — Encounter: Payer: Self-pay | Admitting: Family

## 2013-09-08 VITALS — BP 139/81 | HR 97 | Temp 97.0°F | Ht 61.0 in | Wt 163.0 lb

## 2013-09-08 DIAGNOSIS — F3289 Other specified depressive episodes: Secondary | ICD-10-CM

## 2013-09-08 DIAGNOSIS — F329 Major depressive disorder, single episode, unspecified: Secondary | ICD-10-CM | POA: Insufficient documentation

## 2013-09-08 DIAGNOSIS — F411 Generalized anxiety disorder: Secondary | ICD-10-CM

## 2013-09-08 DIAGNOSIS — F32A Depression, unspecified: Secondary | ICD-10-CM

## 2013-09-08 MED ORDER — ESCITALOPRAM OXALATE 10 MG PO TABS: 10.0000 mg | ORAL_TABLET | Freq: Every day | ORAL | Status: DC

## 2013-09-08 NOTE — Patient Instructions (Signed)
Depression Depression refers to feeling sad, low, down in the dumps, blue, gloomy, or empty. In general, there are two kinds of depression: 1. Normal sadness or normal grief. This kind of depression is one that we all feel from time to time after upsetting life experiences, such as the loss of a job or the ending of a relationship. This kind of depression is considered normal, is short lived, and resolves within a few days to 2 weeks. Depression experienced after the loss of a loved one (bereavement) often lasts longer than 2 weeks but normally gets better with time. 2. Clinical depression. This kind of depression lasts longer than normal sadness or normal grief or interferes with your ability to function at home, at work, and in school. It also interferes with your personal relationships. It affects almost every aspect of your life. Clinical depression is an illness. Symptoms of depression can also be caused by conditions other than those mentioned above, such as:  Physical illness. Some physical illnesses, including underactive thyroid gland (hypothyroidism), severe anemia, specific types of cancer, diabetes, uncontrolled seizures, heart and lung problems, strokes, and chronic pain are commonly associated with symptoms of depression.  Side effects of some prescription medicine. In some people, certain types of medicine can cause symptoms of depression.  Substance abuse. Abuse of alcohol and illicit drugs can cause symptoms of depression. SYMPTOMS Symptoms of normal sadness and normal grief include the following:  Feeling sad or crying for short periods of time.  Not caring about anything (apathy).  Difficulty sleeping or sleeping too much.  No longer able to enjoy the things you used to enjoy.  Desire to be by oneself all the time (social isolation).  Lack of energy or motivation.  Difficulty concentrating or remembering.  Change in appetite or weight.  Restlessness or  agitation. Symptoms of clinical depression include the same symptoms of normal sadness or normal grief and also the following symptoms:  Feeling sad or crying all the time.  Feelings of guilt or worthlessness.  Feelings of hopelessness or helplessness.  Thoughts of suicide or the desire to harm yourself (suicidal ideation).  Loss of touch with reality (psychotic symptoms). Seeing or hearing things that are not real (hallucinations) or having false beliefs about your life or the people around you (delusions and paranoia). DIAGNOSIS  The diagnosis of clinical depression is usually based on how bad the symptoms are and how long they have lasted. Your health care provider will also ask you questions about your medical history and substance use to find out if physical illness, use of prescription medicine, or substance abuse is causing your depression. Your health care provider may also order blood tests. TREATMENT  Often, normal sadness and normal grief do not require treatment. However, sometimes antidepressant medicine is given for bereavement to ease the depressive symptoms until they resolve. The treatment for clinical depression depends on how bad the symptoms are but often includes antidepressant medicine, counseling with a mental health professional, or both. Your health care provider will help to determine what treatment is best for you. Depression caused by physical illness usually goes away with appropriate medical treatment of the illness. If prescription medicine is causing depression, talk with your health care provider about stopping the medicine, decreasing the dose, or changing to another medicine. Depression caused by the abuse of alcohol or illicit drugs goes away when you stop using these substances. Some adults need professional help in order to stop drinking or using drugs. SEEK IMMEDIATE MEDICAL   CARE IF:  You have thoughts about hurting yourself or others.  You lose touch  with reality (have psychotic symptoms).  You are taking medicine for depression and have a serious side effect. FOR MORE INFORMATION  [employer] Mental [employer]ness: www.nami.[employer]: https://carter.com/ Document Released: 12/27/1999 Document Revised: 05/15/2013 Document Reviewed: 03/30/2011 [facility] Patient Information 2015 [place], [place]. This information is not intended to replace advice given to you by your health care provider. Make sure you discuss any questions you have with your health care provider. Generalized Anxiety Disorder Generalized anxiety disorder (GAD) is a mental disorder. It interferes with life functions, including relationships, work, and school. GAD is different from normal anxiety, which everyone experiences at some point in their lives in response to specific life events and activities. Normal anxiety actually helps [place] prepare for and get through these life events and activities. Normal anxiety goes away after the event or activity is over.  GAD causes anxiety that is not necessarily related to specific events or activities. It also causes excess anxiety in proportion to specific events or activities. The anxiety associated with GAD is also difficult to control. GAD can vary from mild to severe. People with severe GAD can have intense waves of anxiety with physical symptoms (panic attacks).  SYMPTOMS The anxiety and worry associated with GAD are difficult to control. This anxiety and worry are related to many life events and activities and also occur more days than not for 6 months or longer. People with GAD also have three or more of the following symptoms (one or more in children):  Restlessness.   Fatigue.  Difficulty concentrating.   Irritability.  Muscle tension.  Difficulty sleeping or unsatisfying sleep. DIAGNOSIS GAD is diagnosed through an assessment by your health care provider. Your health care provider will ask  you questions aboutyour mood,physical symptoms, and events in your life. Your health care provider may ask you about your medical history and use of alcohol or drugs, including prescription medicines. Your health care provider may also do a physical exam and blood tests. Certain medical conditions and the use of certain substances can cause symptoms similar to those associated with GAD. Your health care provider may refer you to a mental health specialist for further evaluation. TREATMENT The following therapies are usually used to treat GAD:   Medication. Antidepressant medication usually is prescribed for long-term daily control. Antianxiety medicines may be added in severe cases, especially when panic attacks occur.   Talk therapy (psychotherapy). Certain types of talk therapy can be helpful in treating GAD by providing support, education, and guidance. A form of talk therapy called cognitive behavioral therapy can teach you healthy ways to think about and react to daily life events and activities.  Stress managementtechniques. These include yoga, meditation, and exercise and can be very helpful when they are practiced regularly. A mental health specialist can help determine which treatment is best for you. Some people see improvement with one therapy. However, other people require a combination of therapies. Document Released: 04/25/2012 Document Revised: 05/15/2013 Document Reviewed: 04/25/2012 [facility] Patient Information 2015 [place], [place]. This information is not intended to replace advice given to you by your health care provider. Make sure you discuss any questions you have with your health care provider.

## 2013-09-08 NOTE — Progress Notes (Signed)
   Subjective:    Patient ID: Connie Kent, female    DOB: 05-21-1945, 68 y.o.   MRN: [id number]  Anxiety Presents for initial visit. Onset was 1 to 4 weeks ago. The problem has been waxing and waning. Symptoms include depressed mood, excessive worry, insomnia, irritability, nervous/anxious behavior and suicidal ideas (Pt states she does not have a plan, she just has thought of it ). Patient reports no dizziness, palpitations, panic or shortness of breath. Symptoms occur rarely. The severity of symptoms is severe. The symptoms are aggravated by family issues. The quality of sleep is poor.     *Pts brother was put in jail one week ago and pt states she has never had to deal with this before and feels overwhelmed. Pt states "all I do is cry".   Review of Systems  Constitutional: Positive for irritability.  HENT: Negative.   Eyes: Negative.   Respiratory: Negative.  Negative for shortness of breath.   Cardiovascular: Negative.  Negative for palpitations.  Gastrointestinal: Negative.   Endocrine: Negative.   Genitourinary: Negative.   Musculoskeletal: Negative.   Neurological: Negative.  Negative for dizziness and headaches.  Hematological: Negative.   Psychiatric/Behavioral: Positive for suicidal ideas (Pt states she does not have a plan, she just has thought of it ). The patient is nervous/anxious and has insomnia.   All other systems reviewed and are negative.      Objective:   Physical Exam  Vitals reviewed. Constitutional: She is oriented to person, place, and time. She appears well-developed and well-nourished. No distress.  Eyes: Pupils are equal, round, and reactive to light.  Neck: Normal range of motion. Neck supple. No thyromegaly present.  Cardiovascular: Normal rate, regular rhythm, normal heart sounds and intact distal pulses.   No murmur heard. Pulmonary/Chest: Effort normal and breath sounds normal. No respiratory distress. She has no wheezes.  Abdominal: Soft.  Bowel sounds are normal. She exhibits no distension. There is no tenderness.  Musculoskeletal: Normal range of motion. She exhibits no edema and no tenderness.  Neurological: She is alert and oriented to person, place, and time. She has normal reflexes. No cranial nerve deficit.  Skin: Skin is warm and dry.  Psychiatric: Her behavior is normal. Judgment and thought content normal. Her mood appears anxious.  Pt crying and tearful during exam    BP 139/81  Pulse 97  Temp(Src) 97 F (36.1 C) (Oral)  Ht 5\' 1"  (1.549 m)  Wt 163 lb (73.936 kg)  BMI 30.81 kg/m2       Assessment & Plan:  1. ANXIETY -Stress management discussed -RTO in 1-2 weeks for anxiety and depression follow-up - escitalopram (LEXAPRO) 10 MG tablet; Take 1 tablet (10 mg total) by mouth daily.  Dispense: 90 tablet; Refill: 3  2. Depression - escitalopram (LEXAPRO) 10 MG tablet; Take 1 tablet (10 mg total) by mouth daily.  Dispense: 90 tablet; Refill: [place], FNP

## 2013-09-25 ENCOUNTER — Ambulatory Visit: Payer: Commercial Managed Care - HMO | Admitting: Family

## 2013-10-25 ENCOUNTER — Ambulatory Visit: Payer: Commercial Managed Care - HMO | Admitting: Family

## 2013-11-13 ENCOUNTER — Encounter: Payer: Self-pay | Admitting: Family

## 2013-12-14 ENCOUNTER — Ambulatory Visit: Payer: Commercial Managed Care - HMO | Admitting: Family Medicine

## 2013-12-25 ENCOUNTER — Ambulatory Visit: Payer: Commercial Managed Care - HMO

## 2014-02-11 ENCOUNTER — Encounter (HOSPITAL_COMMUNITY): Payer: Self-pay | Admitting: *Deleted

## 2014-02-11 ENCOUNTER — Emergency Department (HOSPITAL_COMMUNITY)
Admission: EM | Admit: 2014-02-11 | Discharge: 2014-02-11 | Disposition: A | Discharge status: Home / Self Care | Payer: Medicare Other | Attending: Emergency Medicine | Admitting: Emergency Medicine

## 2014-02-11 DIAGNOSIS — E039 Hypothyroidism, unspecified: Secondary | ICD-10-CM | POA: Diagnosis not present

## 2014-02-11 DIAGNOSIS — K0889 Other specified disorders of teeth and supporting structures: Secondary | ICD-10-CM

## 2014-02-11 DIAGNOSIS — I10 Essential (primary) hypertension: Secondary | ICD-10-CM | POA: Insufficient documentation

## 2014-02-11 DIAGNOSIS — K219 Gastro-esophageal reflux disease without esophagitis: Secondary | ICD-10-CM | POA: Insufficient documentation

## 2014-02-11 DIAGNOSIS — K088 Other specified disorders of teeth and supporting structures: Secondary | ICD-10-CM | POA: Insufficient documentation

## 2014-02-11 DIAGNOSIS — Z87891 Personal history of nicotine dependence: Secondary | ICD-10-CM | POA: Insufficient documentation

## 2014-02-11 DIAGNOSIS — Z79899 Other long term (current) drug therapy: Secondary | ICD-10-CM | POA: Diagnosis not present

## 2014-02-11 MED ORDER — OXYCODONE-ACETAMINOPHEN 5-325 MG PO TABS
1.0000 | ORAL_TABLET | Freq: Once | ORAL | Status: AC
  Administered 2014-02-11: 1 via ORAL
  Filled 2014-02-11: qty 1

## 2014-02-11 MED ORDER — AMOXICILLIN 500 MG PO CAPS: 1000.0000 mg | ORAL_CAPSULE | Freq: Two times a day (BID) | ORAL | Status: DC

## 2014-02-11 MED ORDER — ONDANSETRON 8 MG PO TBDP
8.0000 mg | ORAL_TABLET | Freq: Once | ORAL | Status: AC
  Administered 2014-02-11: 8 mg via ORAL
  Filled 2014-02-11: qty 2

## 2014-02-11 MED ORDER — AMOXICILLIN 250 MG PO CAPS
1000.0000 mg | ORAL_CAPSULE | Freq: Once | ORAL | Status: AC
  Administered 2014-02-11: 1000 mg via ORAL
  Filled 2014-02-11: qty 4

## 2014-02-11 MED ORDER — OXYCODONE-ACETAMINOPHEN 7.5-325 MG PO TABS
1.0000 | ORAL_TABLET | ORAL | Status: DC | PRN
Start: 2014-02-11 — End: 2014-07-17

## 2014-02-11 NOTE — ED Provider Notes (Signed)
CSN: [id number]     Arrival date & time 02/11/14  0028 History   First MD Initiated Contact with Patient 02/11/14 0149     Chief complaint: Toothache  (Consider location/radiation/quality/duration/timing/severity/associated sxs/prior Treatment) The history is provided by the patient.   69 year old female complains of pain in tooth #26. This started yesterday. Pain is severe and she rates it at 10/10. Pain is worse with trying to chew. Nothing makes it better. She has tried taking acetaminophen, aspirin, ibuprofen with no relief. She denies fever or chills. She has had nausea because of the pain and has vomited once. She continues to complain of nausea.  Past Medical History  Diagnosis Date  . Hypertension   . Thyroid disease   . Hyperlipidemia   . GERD (gastroesophageal reflux disease)    Past Surgical History  Procedure Laterality Date  . Breast biopsies    . Abdominal hysterectomy    . Cholecystectomy    . Hemorrhoid surgery    . Throat surgery      Biopsy   Family History  Problem Relation Age of Onset  . Cancer Mother   . Heart failure Father    History  Substance Use Topics  . Smoking status: Former Smoker    Types: Cigarettes  . Smokeless tobacco: Not on file  . Alcohol Use: No   OB History    Gravida Para Term Preterm AB TAB SAB Ectopic Multiple Living   5 4 4       4      Review of Systems  All other systems reviewed and are negative.     Allergies  Review of patient's allergies indicates no known allergies.  Home Medications   Prior to Admission medications   Medication Sig Start Date End Date Taking? Authorizing Provider  atenolol (TENORMIN) 50 MG tablet Take 1 tablet (50 mg total) by mouth daily. 08/29/13  Yes Sharion Balloon, FNP  hydrochlorothiazide (HYDRODIURIL) 25 MG tablet Take 1 tablet (25 mg total) by mouth daily. 08/29/13  Yes Sharion Balloon, FNP  ibuprofen (ADVIL,MOTRIN) 200 MG tablet Take 600 mg by mouth every 6 (six) hours as needed for  moderate pain.   Yes Historical Provider, MD  levothyroxine (SYNTHROID, LEVOTHROID) 50 MCG tablet Take 1 tablet (50 mcg total) by mouth daily. 08/29/13  Yes Sharion Balloon, FNP  Multiple Vitamin (MULTIVITAMIN WITH MINERALS) TABS tablet Take 1 tablet by mouth daily.   Yes Historical Provider, MD  omeprazole (PRILOSEC) 40 MG capsule Take 1 capsule (40 mg total) by mouth daily. 07/26/13  Yes Sharion Balloon, FNP  ranitidine (ZANTAC) 300 MG tablet Take 300 mg by mouth at bedtime.   Yes Historical Provider, MD   Pulse 100  Temp(Src) 97.6 F (36.4 C)  Resp 20  Ht 5\' 2"  (1.575 m)  Wt 160 lb (72.576 kg)  BMI 29.26 kg/m2  SpO2 100% Physical Exam  Nursing note and vitals reviewed.  69 year old female, resting comfortably and in no acute distress. Vital signs are normal. Oxygen saturation is 100%, which is normal. Head is normocephalic and atraumatic. PERRLA, EOMI. Oropharynx is clear. There are no obvious dental caries. There is mild tenderness to percussion of tooth #25 and moderate tenderness to percussion on tooth #26. There is slight pallor of the gingiva of tooth #26 suggesting possible abscess. Neck is nontender and supple without adenopathy or JVD. Back is nontender and there is no CVA tenderness. Lungs are clear without rales, wheezes, or rhonchi. Chest is nontender.  Heart has regular rate and rhythm without murmur. Abdomen is soft, flat, nontender without masses or hepatosplenomegaly and peristalsis is normoactive. Extremities have no cyanosis or edema, full range of motion is present. Skin is warm and dry without rash. Neurologic: Mental status is normal, cranial nerves are intact, there are no motor or sensory deficits.  ED Course  Procedures (including critical care time)  MDM   Final diagnoses:  Pain, dental    Dental pain which is probably secondary to abscessed tooth. Nausea secondary to pain. She is given a dose of ondansetron. When nausea is controlled, she will be given  dose of oxycodone-acetaminophen and amoxicillin and will be discharged with prescriptions for all 3 and referred back to her dentist for definitive management.    Delora Fuel, MD 11/12/26 1188

## 2014-02-11 NOTE — ED Notes (Signed)
Pt c/o pain to a tooth on the lower right side of her jaw. Pt vomited in waiting room.

## 2014-02-11 NOTE — Discharge Instructions (Signed)
Dental Pain A tooth ache may be caused by cavities (tooth decay). Cavities expose the nerve of the tooth to air and hot or cold temperatures. It may come from an infection or abscess (also called a boil or furuncle) around your tooth. It is also often caused by dental caries (tooth decay). This causes the pain you are having. DIAGNOSIS  Your caregiver can diagnose this problem by exam. TREATMENT   If caused by an infection, it may be treated with medications which kill germs (antibiotics) and pain medications as prescribed by your caregiver. Take medications as directed.  Only take over-the-counter or prescription medicines for pain, discomfort, or fever as directed by your caregiver.  Whether the tooth ache today is caused by infection or dental disease, you should see your dentist as soon as possible for further care. SEEK MEDICAL CARE IF: The exam and treatment you received today has been provided on an emergency basis only. This is not a substitute for complete medical or dental care. If your problem worsens or new problems (symptoms) appear, and you are unable to meet with your dentist, call or return to this location. SEEK IMMEDIATE MEDICAL CARE IF:   You have a fever.  You develop redness and swelling of your face, jaw, or neck.  You are unable to open your mouth.  You have severe pain uncontrolled by pain medicine. MAKE SURE YOU:   Understand these instructions.  Will watch your condition.  Will get help right away if you are not doing well or get worse. Document Released: 12/29/2004 Document Revised: 03/23/2011 Document Reviewed: 08/17/2007 [facility] Patient Information 2015 [place], [place]. This information is not intended to replace advice given to you by your health care provider. Make sure you discuss any questions you have with your health care provider.  Amoxicillin capsules or tablets What is this medicine? AMOXICILLIN (a mox i SIL in) is a penicillin antibiotic. It  is used to treat certain kinds of bacterial infections. It will not work for colds, flu, or other viral infections. This medicine may be used for other purposes; ask your health care provider or pharmacist if you have questions. COMMON BRAND NAME(S): Amoxil, Moxilin, Sumox, Trimox What should I tell my health care provider before I take this medicine? They need to know if you have any of these conditions: -asthma -kidney disease -an unusual or allergic reaction to amoxicillin, other penicillins, cephalosporin antibiotics, other medicines, foods, dyes, or preservatives -pregnant or trying to get pregnant -breast-feeding How should I use this medicine? Take this medicine by mouth with a glass of water. Follow the directions on your prescription label. You may take this medicine with food or on an empty stomach. Take your medicine at regular intervals. Do not take your medicine more often than directed. Take all of your medicine as directed even if you think your are better. Do not skip doses or stop your medicine early. Talk to your pediatrician regarding the use of this medicine in children. While this drug may be prescribed for selected conditions, precautions do apply. Overdosage: If you think you have taken too much of this medicine contact a poison control center or emergency room at once. NOTE: This medicine is only for you. Do not share this medicine with others. What if I miss a dose? If you miss a dose, take it as soon as you can. If it is almost time for your next dose, take only that dose. Do not take double or extra doses. What may interact  with this medicine? -amiloride -birth control pills -chloramphenicol -macrolides -probenecid -sulfonamides -tetracyclines This list may not describe all possible interactions. Give your health care provider a list of all the medicines, herbs, non-prescription drugs, or dietary supplements you use. Also tell them if you smoke, drink alcohol, or  use illegal drugs. Some items may interact with your medicine. What should I watch for while using this medicine? Tell your doctor or health care professional if your symptoms do not improve in 2 or 3 days. Take all of the doses of your medicine as directed. Do not skip doses or stop your medicine early. If you are diabetic, you may get a false positive result for sugar in your urine with certain brands of urine tests. Check with your doctor. Do not treat diarrhea with over-the-counter products. Contact your doctor if you have diarrhea that lasts more than 2 days or if the diarrhea is severe and watery. What side effects may I notice from receiving this medicine? Side effects that you should report to your doctor or health care professional as soon as possible: -allergic reactions like skin rash, itching or hives, swelling of the face, lips, or tongue -breathing problems -dark urine -redness, blistering, peeling or loosening of the skin, including inside the mouth -seizures -severe or watery diarrhea -trouble passing urine or change in the amount of urine -unusual bleeding or bruising -unusually weak or tired -yellowing of the eyes or skin Side effects that usually do not require medical attention (report to your doctor or health care professional if they continue or are bothersome): -dizziness -headache -stomach upset -trouble sleeping This list may not describe all possible side effects. Call your doctor for medical advice about side effects. You may report side effects to FDA at 1-800-FDA-1088. Where should I keep my medicine? Keep out of the reach of children. Store between 68 and 77 degrees F (20 and 25 degrees C). Keep bottle closed tightly. Throw away any unused medicine after the expiration date. NOTE: This sheet is a summary. It may not cover all possible information. If you have questions about this medicine, talk to your doctor, pharmacist, or health care provider.  2015,  Elsevier/Gold Standard. (2007-03-22 14:10:59)  Acetaminophen; Oxycodone tablets What is this medicine? ACETAMINOPHEN; OXYCODONE (a set a MEE noe fen; ox i KOE done) is a pain reliever. It is used to treat mild to moderate pain. This medicine may be used for other purposes; ask your health care provider or pharmacist if you have questions. COMMON BRAND NAME(S): Endocet, Magnacet, Narvox, Percocet, Perloxx, Primalev, Primlev, Roxicet, Xolox What should I tell my health care provider before I take this medicine? They need to know if you have any of these conditions: -brain tumor -Crohn's disease, inflammatory bowel disease, or ulcerative colitis -drug abuse or addiction -head injury -heart or circulation problems -if you often drink alcohol -kidney disease or problems going to the bathroom -liver disease -lung disease, asthma, or breathing problems -an unusual or allergic reaction to acetaminophen, oxycodone, other opioid analgesics, other medicines, foods, dyes, or preservatives -pregnant or trying to get pregnant -breast-feeding How should I use this medicine? Take this medicine by mouth with a full glass of water. Follow the directions on the prescription label. Take your medicine at regular intervals. Do not take your medicine more often than directed. Talk to your pediatrician regarding the use of this medicine in children. Special care may be needed. Patients over 79 years old may have a stronger reaction and need a smaller dose.  Overdosage: If you think you have taken too much of this medicine contact a poison control center or emergency room at once. NOTE: This medicine is only for you. Do not share this medicine with others. What if I miss a dose? If you miss a dose, take it as soon as you can. If it is almost time for your next dose, take only that dose. Do not take double or extra doses. What may interact with this medicine? -alcohol -antihistamines -barbiturates like  amobarbital, butalbital, butabarbital, methohexital, pentobarbital, phenobarbital, thiopental, and secobarbital -benztropine -drugs for bladder problems like solifenacin, trospium, oxybutynin, tolterodine, hyoscyamine, and methscopolamine -drugs for breathing problems like ipratropium and tiotropium -drugs for certain stomach or intestine problems like propantheline, homatropine methylbromide, glycopyrrolate, atropine, belladonna, and dicyclomine -general anesthetics like etomidate, ketamine, nitrous oxide, propofol, desflurane, enflurane, halothane, isoflurane, and sevoflurane -medicines for depression, anxiety, or psychotic disturbances -medicines for sleep -muscle relaxants -naltrexone -narcotic medicines (opiates) for pain -phenothiazines like perphenazine, thioridazine, chlorpromazine, mesoridazine, fluphenazine, prochlorperazine, promazine, and trifluoperazine -scopolamine -tramadol -trihexyphenidyl This list may not describe all possible interactions. Give your health care provider a list of all the medicines, herbs, non-prescription drugs, or dietary supplements you use. Also tell them if you smoke, drink alcohol, or use illegal drugs. Some items may interact with your medicine. What should I watch for while using this medicine? Tell your doctor or health care professional if your pain does not go away, if it gets worse, or if you have new or a different type of pain. You may develop tolerance to the medicine. Tolerance means that you will need a higher dose of the medication for pain relief. Tolerance is normal and is expected if you take this medicine for a long time. Do not suddenly stop taking your medicine because you may develop a severe reaction. Your body becomes used to the medicine. This does NOT mean you are addicted. Addiction is a behavior related to getting and using a drug for a non-medical reason. If you have pain, you have a medical reason to take pain medicine. Your doctor  will tell you how much medicine to take. If your doctor wants you to stop the medicine, the dose will be slowly lowered over time to avoid any side effects. You may get drowsy or dizzy. Do not drive, use machinery, or do anything that needs mental alertness until you know how this medicine affects you. Do not stand or sit up quickly, especially if you are an older patient. This reduces the risk of dizzy or fainting spells. Alcohol may interfere with the effect of this medicine. Avoid alcoholic drinks. There are different types of narcotic medicines (opiates) for pain. If you take more than one type at the same time, you may have more side effects. Give your health care provider a list of all medicines you use. Your doctor will tell you how much medicine to take. Do not take more medicine than directed. Call emergency for help if you have problems breathing. The medicine will cause constipation. Try to have a bowel movement at least every 2 to 3 days. If you do not have a bowel movement for 3 days, call your doctor or health care professional. Do not take Tylenol (acetaminophen) or medicines that have acetaminophen with this medicine. Too much acetaminophen can be very dangerous. Many nonprescription medicines contain acetaminophen. Always read the labels carefully to avoid taking more acetaminophen. What side effects may I notice from receiving this medicine? Side effects that you should report  to your doctor or health care professional as soon as possible: -allergic reactions like skin rash, itching or hives, swelling of the face, lips, or tongue -breathing difficulties, wheezing -confusion -light headedness or fainting spells -severe stomach pain -unusually weak or tired -yellowing of the skin or the whites of the eyes Side effects that usually do not require medical attention (report to your doctor or health care professional if they continue or are  bothersome): -dizziness -drowsiness -nausea -vomiting This list may not describe all possible side effects. Call your doctor for medical advice about side effects. You may report side effects to FDA at 1-800-FDA-1088. Where should I keep my medicine? Keep out of the reach of children. This medicine can be abused. Keep your medicine in a safe place to protect it from theft. Do not share this medicine with anyone. Selling or giving away this medicine is dangerous and against the law. Store at room temperature between 20 and 25 degrees C (68 and 77 degrees F). Keep container tightly closed. Protect from light. This medicine may cause accidental overdose and death if it is taken by other adults, children, or pets. Flush any unused medicine down the toilet to reduce the chance of harm. Do not use the medicine after the expiration date. NOTE: This sheet is a summary. It may not cover all possible information. If you have questions about this medicine, talk to your doctor, pharmacist, or health care provider.  2015, Elsevier/Gold Standard. (2012-08-22 13:17:35)

## 2014-03-07 IMAGING — CR DG CHEST 1V PORT
1 series · 1 of 1 positions shown · non-contrast
Comparison: Plain films of the chest 01/10/2008 and CT chest
06/20/2007.

CLINICAL DATA: Chest pain.  Smoker.

PORTABLE CHEST - 1 VIEW

[view not recorded]
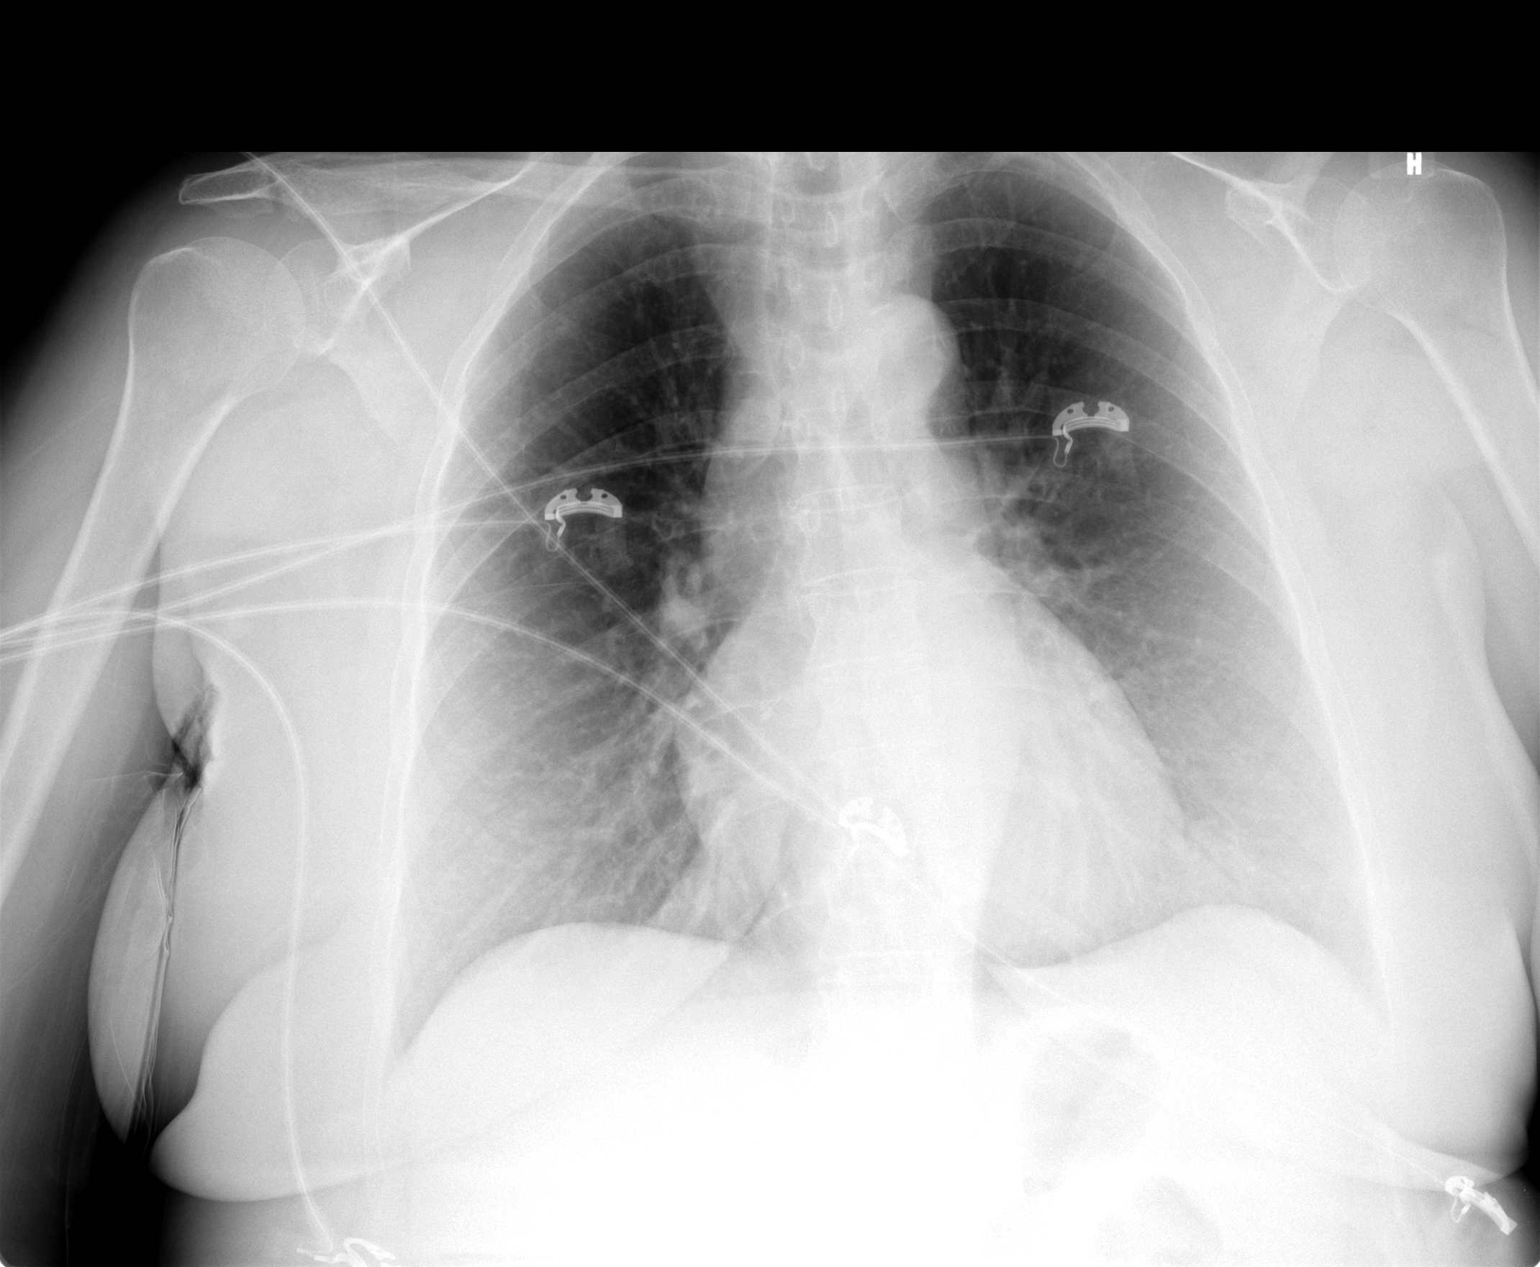

[1 of 1 positions shown; findings below may reference images not displayed]

FINDINGS: There is fullness of the right paratracheal stripe which
is new since the prior study.  Lungs appear clear.  Heart size is
normal.  No pneumothorax or pleural effusion.
IMPRESSION: Fullness of the right paratracheal stripe could be due to AP
technique.  Lymphadenopathy can cause this finding.  Repeat PA and
lateral films are recommended.

## 2014-07-17 ENCOUNTER — Encounter: Payer: Self-pay | Admitting: Family

## 2014-07-17 ENCOUNTER — Ambulatory Visit (INDEPENDENT_AMBULATORY_CARE_PROVIDER_SITE_OTHER): Payer: Commercial Managed Care - HMO | Admitting: Family

## 2014-07-17 VITALS — BP 158/76 | HR 74 | Temp 98.4°F | Ht 62.0 in | Wt 167.2 lb

## 2014-07-17 DIAGNOSIS — K219 Gastro-esophageal reflux disease without esophagitis: Secondary | ICD-10-CM | POA: Diagnosis not present

## 2014-07-17 DIAGNOSIS — R1012 Left upper quadrant pain: Secondary | ICD-10-CM | POA: Diagnosis not present

## 2014-07-17 DIAGNOSIS — K279 Peptic ulcer, site unspecified, unspecified as acute or chronic, without hemorrhage or perforation: Secondary | ICD-10-CM | POA: Diagnosis not present

## 2014-07-17 DIAGNOSIS — K2970 Gastritis, unspecified, without bleeding: Secondary | ICD-10-CM

## 2014-07-17 LAB — POCT CBC
Granulocyte percent: 47.5 % (ref 37–80)
HCT, POC: 41.7 % (ref 37.7–47.9)
Hemoglobin: 13.3 g/dL (ref 12.2–16.2)
Lymph, poc: 3.1 (ref 0.6–3.4)
MCH, POC: 29.4 pg (ref 27–31.2)
MCHC: 32.4 g/dL (ref 31.8–35.4)
MCV: 90.7 fL (ref 80–97)
MPV: 8.8 fL (ref 0–99.8)
POC Granulocyte: 3.2 (ref 2–6.9)
POC LYMPH PERCENT: 46.2 % (ref 10–50)
Platelet Count, POC: 229 K/uL (ref 142–424)
RBC: 4.53 M/uL (ref 4.04–5.48)
RDW, POC: 13.7 %
WBC: 6.8 K/uL (ref 4.6–10.2)

## 2014-07-17 MED ORDER — OMEPRAZOLE 40 MG PO CPDR: 40.0000 mg | DELAYED_RELEASE_CAPSULE | Freq: Every day | ORAL | Status: DC

## 2014-07-17 NOTE — Progress Notes (Signed)
Subjective:    Patient ID: Connie Kent, female    DOB: 05-01-1945, 69 y.o.   MRN: [id number]  Pt presents to the office today for abdominal pain in LUQ. Pt states she does not have her gallbladder or apendix. Pt reports taking 662m of Motrin every night and sometimes 6051min the morning depending on her pain.  Abdominal Pain This is a new problem. The current episode started in the past 7 days. The onset quality is sudden. The problem occurs intermittently. The problem has been unchanged. The pain is located in the LUQ. The pain is at a severity of 10/10. The pain is moderate. The quality of the pain is sharp. The abdominal pain does not radiate. Associated symptoms include nausea. Pertinent negatives include no belching, constipation, diarrhea, dysuria, flatus, headaches, hematochezia, hematuria or vomiting. Nothing aggravates the pain. The pain is relieved by nothing. She has tried nothing for the symptoms. The treatment provided no relief. Her past medical history is significant for GERD. There is no history of gallstones or pancreatitis.      Review of Systems  Constitutional: Negative.   HENT: Negative.   Eyes: Negative.   Respiratory: Negative.  Negative for shortness of breath.   Cardiovascular: Negative.  Negative for palpitations.  Gastrointestinal: Positive for nausea and abdominal pain. Negative for vomiting, diarrhea, constipation, hematochezia and flatus.  Endocrine: Negative.   Genitourinary: Negative.  Negative for dysuria and hematuria.  Musculoskeletal: Negative.   Neurological: Negative.  Negative for headaches.  Hematological: Negative.   Psychiatric/Behavioral: Negative.   All other systems reviewed and are negative.      Objective:   Physical Exam  Constitutional: She is oriented to person, place, and time. She appears well-developed and well-nourished. No distress.  HENT:  Head: Normocephalic and atraumatic.  Right Ear: External ear normal.  Left Ear:  External ear normal.  Nose: Nose normal.  Mouth/Throat: Oropharynx is clear and moist.  Eyes: Pupils are equal, round, and reactive to light.  Neck: Normal range of motion. Neck supple. No thyromegaly present.  Cardiovascular: Normal rate, regular rhythm, normal heart sounds and intact distal pulses.   No murmur heard. Pulmonary/Chest: Effort normal and breath sounds normal. No respiratory distress. She has no wheezes.  Abdominal: Soft. Bowel sounds are normal. She exhibits no distension. There is no tenderness.  Musculoskeletal: Normal range of motion. She exhibits no edema or tenderness.  Neurological: She is alert and oriented to person, place, and time. She has normal reflexes. No cranial nerve deficit.  Skin: Skin is warm and dry.  Psychiatric: She has a normal mood and affect. Her behavior is normal. Judgment and thought content normal.  Vitals reviewed.     BP 158/76 mmHg  Pulse 74  Temp(Src) 98.4 F (36.9 C) (Oral)  Ht 5' 2"  (1.575 m)  Wt 167 lb 3.2 oz (75.841 kg)  BMI 30.57 kg/m2     Assessment & Plan:  1. Abdominal pain, left upper quadrant - CMP14+EGFR - H Pylori, IGM, IGG, IGA AB - POCT CBC  2. Gastroesophageal reflux disease without esophagitis - omeprazole (PRILOSEC) 40 MG capsule; Take 1 capsule (40 mg total) by mouth daily.  Dispense: 90 capsule; Refill: 3  3. Peptic ulcer - CMP14+EGFR - H Pylori, IGM, IGG, IGA AB - POCT CBC - omeprazole (PRILOSEC) 40 MG capsule; Take 1 capsule (40 mg total) by mouth daily.  Dispense: 90 capsule; Refill: 3  4. Gastritis - CMP14+EGFR - H Pylori, IGM, IGG, IGA AB - POCT  CBC - omeprazole (PRILOSEC) 40 MG capsule; Take 1 capsule (40 mg total) by mouth daily.  Dispense: 90 capsule; Refill: 3   Pt to restart Omeprazole 40 mg dialy Labs pending NO NSAIDS Diet discussed-Limit spicy foods, alcohol, and caffeine Exercise encouraged RTO if stomach pain does not improve- Pt needs to make chronic follow up appt  Evelina Dun, FNP

## 2014-07-17 NOTE — Patient Instructions (Signed)
Gastritis, Adult Gastritis is soreness and swelling (inflammation) of the lining of the stomach. Gastritis can develop as a sudden onset (acute) or long-term (chronic) condition. If gastritis is not treated, it can lead to stomach bleeding and ulcers. CAUSES  Gastritis occurs when the stomach lining is weak or damaged. Digestive juices from the stomach then inflame the weakened stomach lining. The stomach lining may be weak or damaged due to viral or bacterial infections. One common bacterial infection is the Helicobacter pylori infection. Gastritis can also result from excessive alcohol consumption, taking certain medicines, or having too much acid in the stomach.  SYMPTOMS  In some cases, there are no symptoms. When symptoms are present, they may include:  Pain or a burning sensation in the upper abdomen.  Nausea.  Vomiting.  An uncomfortable feeling of fullness after eating. DIAGNOSIS  Your caregiver may suspect you have gastritis based on your symptoms and a physical exam. To determine the cause of your gastritis, your caregiver may perform the following:  Blood or stool tests to check for the H pylori bacterium.  Gastroscopy. A thin, flexible tube (endoscope) is passed down the esophagus and into the stomach. The endoscope has a light and camera on the end. Your caregiver uses the endoscope to view the inside of the stomach.  Taking a tissue sample (biopsy) from the stomach to examine under a microscope. TREATMENT  Depending on the cause of your gastritis, medicines may be prescribed. If you have a bacterial infection, such as an H pylori infection, antibiotics may be given. If your gastritis is caused by too much acid in the stomach, H2 blockers or antacids may be given. Your caregiver may recommend that you stop taking aspirin, ibuprofen, or other nonsteroidal anti-inflammatory drugs (NSAIDs). HOME CARE INSTRUCTIONS  Only take over-the-counter or prescription medicines as directed by  your caregiver.  If you were given antibiotic medicines, take them as directed. Finish them even if you start to feel better.  Drink enough fluids to keep your urine clear or pale yellow.  Avoid foods and drinks that make your symptoms worse, such as:  Caffeine or alcoholic drinks.  Chocolate.  Peppermint or mint flavorings.  Garlic and onions.  Spicy foods.  Citrus fruits, such as oranges, lemons, or limes.  Tomato-based foods such as sauce, chili, salsa, and pizza.  Fried and fatty foods.  Eat small, frequent meals instead of large meals. SEEK IMMEDIATE MEDICAL CARE IF:   You have black or dark red stools.  You vomit blood or material that looks like coffee grounds.  You are unable to keep fluids down.  Your abdominal pain gets worse.  You have a fever.  You do not feel better after 1 week.  You have any other questions or concerns. MAKE SURE YOU:  Understand these instructions.  Will watch your condition.  Will get help right away if you are not doing well or get worse. Document Released: 12/23/2000 Document Revised: 06/30/2011 Document Reviewed: 02/11/2011 [facility] Patient Information 2015 [place], [place]. This information is not intended to replace advice given to you by your health care provider. Make sure you discuss any questions you have with your health care provider. Food Choices for Peptic Ulcer Disease When you have peptic ulcer disease, the foods you eat and your eating habits are very important. Choosing the right foods can help ease the discomfort of peptic ulcer disease. WHAT GENERAL GUIDELINES DO I NEED TO FOLLOW?  Choose fruits, vegetables, whole grains, and low-fat meat, fish, and  poultry.   Keep a food diary to identify foods that cause symptoms.  Avoid foods that cause irritation or pain. These may be different for different people.  Eat frequent small meals instead of three large meals each day. The pain may be worse when your stomach  is empty.  Avoid eating close to bedtime. WHAT FOODS ARE NOT RECOMMENDED? The following are some foods and drinks that may worsen your symptoms:  Black, white, and red pepper.  Hot sauce.  Chili peppers.  Chili powder.  Chocolate and cocoa.   Alcohol.  Tea, coffee, and cola (regular and decaffeinated). The items listed above may not be a complete list of foods and beverages to avoid. Contact your dietitian for more information. Document Released: 03/23/2011 Document Revised: 01/03/2013 Document Reviewed: 11/02/2012 [facility]  [facility] Patient Information 2015 [place], [place]. This information is not intended to replace advice given to you by your health care provider. Make sure you discuss any questions you have with your health care provider.

## 2014-07-19 ENCOUNTER — Other Ambulatory Visit: Payer: Self-pay | Admitting: Family

## 2014-07-19 DIAGNOSIS — E875 Hyperkalemia: Secondary | ICD-10-CM

## 2014-07-19 LAB — CMP14+EGFR
A/G RATIO: 1.5 (ref 1.1–2.5)
ALT: 75 IU/L — AB (ref 0–32)
AST: 59 IU/L — ABNORMAL HIGH (ref 0–40)
Albumin: 4.2 g/dL (ref 3.6–4.8)
Alkaline Phosphatase: 74 IU/L (ref 39–117)
BUN/Creatinine Ratio: 21 (ref 11–26)
BUN: 15 mg/dL (ref 8–27)
Bilirubin Total: 0.4 mg/dL (ref 0.0–1.2)
CO2: 24 mmol/L (ref 18–29)
CREATININE: 0.71 mg/dL (ref 0.57–1.00)
Calcium: 9.7 mg/dL (ref 8.7–10.3)
Chloride: 102 mmol/L (ref 97–108)
GFR calc Af Amer: 101 mL/min/{1.73_m2} (ref >59)
GFR, EST NON AFRICAN AMERICAN: 88 mL/min/{1.73_m2} (ref >59)
GLOBULIN, TOTAL: 2.8 g/dL (ref 1.5–4.5)
GLUCOSE: 85 mg/dL (ref 65–99)
Potassium: 5.6 mmol/L — ABNORMAL HIGH (ref 3.5–5.2)
Sodium: 142 mmol/L (ref 134–144)
TOTAL PROTEIN: 7 g/dL (ref 6.0–8.5)

## 2014-07-19 LAB — H PYLORI, IGM, IGG, IGA AB
H PYLORI IGG: 1.4 U/mL — AB (ref 0.0–0.8)
H. PYLORI, IGA ABS: 11.1 U — AB (ref 0.0–8.9)

## 2014-07-19 MED ORDER — CLARITHROMYCIN 500 MG PO TABS: 500.0000 mg | ORAL_TABLET | Freq: Two times a day (BID) | ORAL | Status: DC

## 2014-07-19 MED ORDER — METRONIDAZOLE 500 MG PO TABS: 500.0000 mg | ORAL_TABLET | Freq: Two times a day (BID) | ORAL | Status: DC

## 2014-07-20 ENCOUNTER — Other Ambulatory Visit (INDEPENDENT_AMBULATORY_CARE_PROVIDER_SITE_OTHER): Payer: Commercial Managed Care - HMO

## 2014-07-20 DIAGNOSIS — E875 Hyperkalemia: Secondary | ICD-10-CM | POA: Diagnosis not present

## 2014-07-20 NOTE — Progress Notes (Signed)
Lab only 

## 2014-07-21 LAB — BMP8+EGFR
BUN/Creatinine Ratio: 21 (ref 11–26)
BUN: 19 mg/dL (ref 8–27)
CALCIUM: 9.4 mg/dL (ref 8.7–10.3)
CO2: 24 mmol/L (ref 18–29)
Chloride: 101 mmol/L (ref 97–108)
Creatinine, Ser: 0.9 mg/dL (ref 0.57–1.00)
GFR calc Af Amer: 76 mL/min/{1.73_m2} (ref >59)
GFR, EST NON AFRICAN AMERICAN: 66 mL/min/{1.73_m2} (ref >59)
Glucose: 97 mg/dL (ref 65–99)
Potassium: 5 mmol/L (ref 3.5–5.2)
Sodium: 138 mmol/L (ref 134–144)

## 2014-07-23 ENCOUNTER — Telehealth: Payer: Self-pay | Admitting: *Deleted

## 2014-07-23 NOTE — Telephone Encounter (Signed)
-----   Message from Sharion Balloon, [place] sent at 07/21/2014 10:21 AM EDT ----- Kidney and liver function stable Potassium WNL

## 2014-07-25 ENCOUNTER — Other Ambulatory Visit: Payer: Self-pay | Admitting: Family

## 2014-08-02 ENCOUNTER — Telehealth: Payer: Self-pay | Admitting: Family

## 2014-08-02 NOTE — Telephone Encounter (Signed)
We need more info  How is she feeling? What medications is she thinking is causing problems?  ETC...  LMTCB- jhb

## 2014-08-02 NOTE — Telephone Encounter (Signed)
Pt needs to try to complete antibiotics- It was to treat her H pylori infection. Infection will not get better without antibiotics

## 2014-08-02 NOTE — Telephone Encounter (Signed)
On 2 antibiotics now  Nausea, no vomiting, some diarrhea.  She has quit the 2 meds  And she DID take them with food- did not help.  What does she need to do

## 2014-08-03 NOTE — Telephone Encounter (Signed)
Patient aware and verbalizes understanding. 

## 2014-08-16 ENCOUNTER — Ambulatory Visit: Payer: Commercial Managed Care - HMO | Admitting: Family

## 2014-08-20 ENCOUNTER — Encounter: Payer: Self-pay | Admitting: Family

## 2014-08-20 DIAGNOSIS — H524 Presbyopia: Secondary | ICD-10-CM | POA: Diagnosis not present

## 2014-08-20 DIAGNOSIS — H5210 Myopia, unspecified eye: Secondary | ICD-10-CM | POA: Diagnosis not present

## 2014-09-04 ENCOUNTER — Encounter: Payer: Self-pay | Admitting: Family Medicine

## 2014-09-04 ENCOUNTER — Ambulatory Visit (INDEPENDENT_AMBULATORY_CARE_PROVIDER_SITE_OTHER): Payer: Commercial Managed Care - HMO | Admitting: Family Medicine

## 2014-09-04 VITALS — BP 151/79 | HR 79 | Temp 98.0°F | Ht 62.0 in | Wt 168.8 lb

## 2014-09-04 DIAGNOSIS — I10 Essential (primary) hypertension: Secondary | ICD-10-CM

## 2014-09-04 DIAGNOSIS — R0602 Shortness of breath: Secondary | ICD-10-CM | POA: Insufficient documentation

## 2014-09-04 DIAGNOSIS — G5603 Carpal tunnel syndrome, bilateral upper limbs: Secondary | ICD-10-CM

## 2014-09-04 DIAGNOSIS — M5441 Lumbago with sciatica, right side: Secondary | ICD-10-CM

## 2014-09-04 DIAGNOSIS — G5600 Carpal tunnel syndrome, unspecified upper limb: Secondary | ICD-10-CM | POA: Insufficient documentation

## 2014-09-04 DIAGNOSIS — R0609 Other forms of dyspnea: Secondary | ICD-10-CM | POA: Diagnosis not present

## 2014-09-04 DIAGNOSIS — G5601 Carpal tunnel syndrome, right upper limb: Secondary | ICD-10-CM | POA: Diagnosis not present

## 2014-09-04 DIAGNOSIS — G5602 Carpal tunnel syndrome, left upper limb: Secondary | ICD-10-CM

## 2014-09-04 MED ORDER — AMLODIPINE BESYLATE 5 MG PO TABS: 5.0000 mg | ORAL_TABLET | Freq: Every day | ORAL | Status: DC

## 2014-09-04 NOTE — Assessment & Plan Note (Addendum)
Exertional dyspnea with some chest pain that self resolved Sounds like stable angina None currently, refer to cardiology for further evaluation Discussed red flags for seeking emergency medical care with patient

## 2014-09-04 NOTE — Assessment & Plan Note (Signed)
Symptoms consistent with carpal tunnel syndrome Treat conservatively for now with cockup splints

## 2014-09-04 NOTE — Progress Notes (Signed)
Patient ID: Connie Kent, female   DOB: 10/06/1945, 69 y.o.   MRN: [id number]   HPI  Patient presents today for evaluation of shortness of breath, decreased energy, pain, back pain, and arm numbness  Shortness of breath States that it's been happening over the last year but worse over the last month or so. Describes shortness of breath with activity, activity as little as walking to the bathroom She has associated left-sided tightness type chest pain. Both of these resolved after a few minutes of rest. No orthopnea, PND, no current chest pain. No palpitations, headaches, leg edema  Back pain Has long history of lumbar back pain States that she has continued lumbar back pain and left-sided paraspinal muscle pain that radiates down to her left buttock. She previously got epidural injections with good benefit and would like to have these again. She denies bowel or bladder dysfunction, saddle anesthesia, leg weakness  Hand numbness She reports several months duration of tingling numbness in her first through third fingers on bilateral hands with knitting or when she wakes up in the morning. She denies any weakness of her hands She also sometimes has numbness when she's driving. She's not tried any medications or devices for this. Seems to be worsening lately  PMH: Smoking status noted ROS: Per HPI  Objective: BP 151/79 mmHg  Pulse 79  Temp(Src) 98 F (36.7 C) (Oral)  Ht 5' 2" (1.575 m)  Wt 168 lb 12.8 oz (76.567 kg)  BMI 30.87 kg/m2 Gen: NAD, alert, cooperative with exam HEENT: NCAT CV: RRR, good S1/S2, no murmur Resp: CTABL, no wheezes, non-labored Abd: SNTND, BS present, no guarding or organomegaly Ext: No edema, warm Neuro: Alert and oriented, No gross deficits  Assessment and plan:  Lumbar back pain Lumbar back pain, long-standing with sciatica Previously had back injections with good success Refer to orthopedic surgery for epidural injections most  likely  Exertional dyspnea Exertional dyspnea with some chest pain that self resolved Sounds like stable angina None currently, refer to cardiology for further evaluation Discussed red flags for seeking emergency medical care with patient  Carpal tunnel syndrome Symptoms consistent with carpal tunnel syndrome Treat conservatively for now with cockup splints  Essential hypertension Elevated Considering dyspnea and chest pain concerning for stable angina will add amlodipine Blood pressure log, follow-up one month    Orders Placed This Encounter  Procedures  . CMP14+EGFR  . CBC with Differential  . TSH  . Ambulatory referral to Orthopedic Surgery    Referral Priority:  Routine    Referral Type:  Surgical    Referral Reason:  Specialty Services Required    Requested Specialty:  Orthopedic Surgery    Number of Visits Requested:  1  . Ambulatory referral to Cardiology    Referral Priority:  Routine    Referral Type:  Consultation    Referral Reason:  Specialty Services Required    Requested Specialty:  Cardiology    Number of Visits Requested:  1    Meds ordered this encounter  Medications  . amLODipine (NORVASC) 5 MG tablet    Sig: Take 1 tablet (5 mg total) by mouth daily.    Dispense:  90 tablet    Refill:  3    Sam Bradshaw, MD [facility] 09/04/2014, 3:40 PM      

## 2014-09-04 NOTE — Assessment & Plan Note (Signed)
Lumbar back pain, long-standing with sciatica Previously had back injections with good success Refer to orthopedic surgery for epidural injections most likely

## 2014-09-04 NOTE — Patient Instructions (Signed)
Great to meet you!  Your blood pressure is elevated, considering your shortness of breath we should be aggressive  I have started amlodipine, another blood pressure medicine  Please keep a log of your blood pressures a few days a week and come back in 1 month  Chest Pain (Nonspecific) It is often hard to give a specific diagnosis for the cause of chest pain. There is always a chance that your pain could be related to something serious, such as a heart attack or a blood clot in the lungs. You need to follow up with your health care provider for further evaluation. CAUSES   Heartburn.  Pneumonia or bronchitis.  Anxiety or stress.  Inflammation around your heart (pericarditis) or lung (pleuritis or pleurisy).  A blood clot in the lung.  A collapsed lung (pneumothorax). It can develop suddenly on its own (spontaneous pneumothorax) or from trauma to the chest.  Shingles infection (herpes zoster virus). The chest wall is composed of bones, muscles, and cartilage. Any of these can be the source of the pain.  The bones can be bruised by injury.  The muscles or cartilage can be strained by coughing or overwork.  The cartilage can be affected by inflammation and become sore (costochondritis). DIAGNOSIS  Lab tests or other studies may be needed to find the cause of your pain. Your health care provider may have you take a test called an ambulatory electrocardiogram (ECG). An ECG records your heartbeat patterns over a 24-hour period. You may also have other tests, such as:  Transthoracic echocardiogram (TTE). During echocardiography, sound waves are used to evaluate how blood flows through your heart.  Transesophageal echocardiogram (TEE).  Cardiac monitoring. This allows your health care provider to monitor your heart rate and rhythm in real time.  Holter monitor. This is a portable device that records your heartbeat and can help diagnose heart arrhythmias. It allows your health care  provider to track your heart activity for several days, if needed.  Stress tests by exercise or by giving medicine that makes the heart beat faster. TREATMENT   Treatment depends on what may be causing your chest pain. Treatment may include:  Acid blockers for heartburn.  Anti-inflammatory medicine.  Pain medicine for inflammatory conditions.  Antibiotics if an infection is present.  You may be advised to change lifestyle habits. This includes stopping smoking and avoiding alcohol, caffeine, and chocolate.  You may be advised to keep your head raised (elevated) when sleeping. This reduces the chance of acid going backward from your stomach into your esophagus. Most of the time, nonspecific chest pain will improve within 2-3 days with rest and mild pain medicine.  HOME CARE INSTRUCTIONS   If antibiotics were prescribed, take them as directed. Finish them even if you start to feel better.  For the next few days, avoid physical activities that bring on chest pain. Continue physical activities as directed.  Do not use any tobacco products, including cigarettes, chewing tobacco, or electronic cigarettes.  Avoid drinking alcohol.  Only take medicine as directed by your health care provider.  Follow your health care provider's suggestions for further testing if your chest pain does not go away.  Keep any follow-up appointments you made. If you do not go to an appointment, you could develop lasting (chronic) problems with pain. If there is any problem keeping an appointment, call to reschedule. SEEK MEDICAL CARE IF:   Your chest pain does not go away, even after treatment.  You have a rash  with blisters on your chest.  You have a fever. SEEK IMMEDIATE MEDICAL CARE IF:   You have increased chest pain or pain that spreads to your arm, neck, jaw, back, or abdomen.  You have shortness of breath.  You have an increasing cough, or you cough up blood.  You have severe back or  abdominal pain.  You feel nauseous or vomit.  You have severe weakness.  You faint.  You have chills. This is an emergency. Do not wait to see if the pain will go away. Get medical help at once. Call your local emergency services (911 in [place] Do not drive yourself to the hospital. MAKE SURE YOU:   Understand these instructions.  Will watch your condition.  Will get help right away if you are not doing well or get worse. Document Released: 10/08/2004 Document Revised: 01/03/2013 Document Reviewed: 08/04/2007 [facility] Patient Information 2015 [place], [place]. This information is not intended to replace advice given to you by your health care provider. Make sure you discuss any questions you have with your health care provider.

## 2014-09-04 NOTE — Assessment & Plan Note (Signed)
Elevated Considering dyspnea and chest pain concerning for stable angina will add amlodipine Blood pressure log, follow-up one month

## 2014-09-05 LAB — CBC WITH DIFFERENTIAL/PLATELET
BASOS: 0 %
Basophils Absolute: 0 10*3/uL (ref 0.0–0.2)
EOS (ABSOLUTE): 0.3 10*3/uL (ref 0.0–0.4)
Eos: 4 %
Hematocrit: 37.7 % (ref 34.0–46.6)
Hemoglobin: 12.3 g/dL (ref 11.1–15.9)
Immature Grans (Abs): 0 10*3/uL (ref 0.0–0.1)
Immature Granulocytes: 0 %
LYMPHS: 45 %
Lymphocytes Absolute: 3.1 10*3/uL (ref 0.7–3.1)
MCH: 30.2 pg (ref 26.6–33.0)
MCHC: 32.6 g/dL (ref 31.5–35.7)
MCV: 93 fL (ref 79–97)
MONOS ABS: 0.6 10*3/uL (ref 0.1–0.9)
Monocytes: 8 %
NEUTROS PCT: 43 %
Neutrophils Absolute: 2.9 10*3/uL (ref 1.4–7.0)
Platelets: 227 10*3/uL (ref 150–379)
RBC: 4.07 x10E6/uL (ref 3.77–5.28)
RDW: 13.9 % (ref 12.3–15.4)
WBC: 6.9 10*3/uL (ref 3.4–10.8)

## 2014-09-05 LAB — CMP14+EGFR
ALT: 27 IU/L (ref 0–32)
AST: 32 IU/L (ref 0–40)
Albumin/Globulin Ratio: 1.3 (ref 1.1–2.5)
Albumin: 4.1 g/dL (ref 3.6–4.8)
Alkaline Phosphatase: 74 IU/L (ref 39–117)
BILIRUBIN TOTAL: 0.4 mg/dL (ref 0.0–1.2)
BUN/Creatinine Ratio: 30 — ABNORMAL HIGH (ref 11–26)
BUN: 18 mg/dL (ref 8–27)
CALCIUM: 9.5 mg/dL (ref 8.7–10.3)
CHLORIDE: 102 mmol/L (ref 97–108)
CO2: 25 mmol/L (ref 18–29)
Creatinine, Ser: 0.61 mg/dL (ref 0.57–1.00)
GFR calc Af Amer: 108 mL/min/{1.73_m2} (ref >59)
GFR, EST NON AFRICAN AMERICAN: 93 mL/min/{1.73_m2} (ref >59)
GLOBULIN, TOTAL: 3.1 g/dL (ref 1.5–4.5)
Glucose: 91 mg/dL (ref 65–99)
POTASSIUM: 4.9 mmol/L (ref 3.5–5.2)
SODIUM: 140 mmol/L (ref 134–144)
Total Protein: 7.2 g/dL (ref 6.0–8.5)

## 2014-09-05 LAB — TSH: TSH: 3.52 u[IU]/mL (ref 0.450–4.500)

## 2014-09-10 DIAGNOSIS — D3131 Benign neoplasm of right choroid: Secondary | ICD-10-CM | POA: Diagnosis not present

## 2014-09-10 DIAGNOSIS — H2512 Age-related nuclear cataract, left eye: Secondary | ICD-10-CM | POA: Diagnosis not present

## 2014-09-10 DIAGNOSIS — H35033 Hypertensive retinopathy, bilateral: Secondary | ICD-10-CM | POA: Diagnosis not present

## 2014-09-10 DIAGNOSIS — H25012 Cortical age-related cataract, left eye: Secondary | ICD-10-CM | POA: Diagnosis not present

## 2014-09-18 DIAGNOSIS — H2512 Age-related nuclear cataract, left eye: Secondary | ICD-10-CM | POA: Diagnosis not present

## 2014-10-04 ENCOUNTER — Ambulatory Visit: Payer: Medicare Other | Admitting: Cardiovascular Disease

## 2014-10-05 ENCOUNTER — Ambulatory Visit: Payer: Commercial Managed Care - HMO | Admitting: Family Medicine

## 2014-10-15 ENCOUNTER — Ambulatory Visit: Payer: Medicare Other | Admitting: Cardiovascular Disease

## 2014-10-18 DIAGNOSIS — H25011 Cortical age-related cataract, right eye: Secondary | ICD-10-CM | POA: Diagnosis not present

## 2014-10-18 DIAGNOSIS — H2511 Age-related nuclear cataract, right eye: Secondary | ICD-10-CM | POA: Diagnosis not present

## 2014-10-24 ENCOUNTER — Encounter: Payer: Self-pay | Admitting: Family Medicine

## 2014-10-24 ENCOUNTER — Telehealth: Payer: Self-pay | Admitting: Family

## 2014-10-24 ENCOUNTER — Ambulatory Visit (INDEPENDENT_AMBULATORY_CARE_PROVIDER_SITE_OTHER): Payer: Commercial Managed Care - HMO | Admitting: Family Medicine

## 2014-10-24 VITALS — BP 147/76 | HR 82 | Temp 97.0°F | Ht 62.0 in | Wt 164.6 lb

## 2014-10-24 DIAGNOSIS — I10 Essential (primary) hypertension: Secondary | ICD-10-CM | POA: Diagnosis not present

## 2014-10-24 MED ORDER — TRAMADOL HCL 50 MG PO TABS: 50.0000 mg | ORAL_TABLET | Freq: Three times a day (TID) | ORAL | Status: DC | PRN

## 2014-10-24 MED ORDER — PREDNISONE 20 MG PO TABS: ORAL_TABLET | ORAL | Status: DC

## 2014-10-24 MED ORDER — AMLODIPINE BESYLATE 10 MG PO TABS: 10.0000 mg | ORAL_TABLET | Freq: Every day | ORAL | Status: DC

## 2014-10-24 NOTE — Patient Instructions (Signed)
Great to see you guys!  Start the prednisone, you will notice a big difference tomorrow na dthe following day You can try tramadol for pain tonight Also try heat 10-15 minutes as needed.   I have increased your blood pressure medicine, amlodipine, to 10 mg.   Be sure to see orthopedic surgery as soon as you can.    Back Pain, Adult Back pain is very common in adults.The cause of back pain is rarely dangerous and the pain often gets better over time.The cause of your back pain may not be known. Some common causes of back pain include:  Strain of the muscles or ligaments supporting the spine.  Wear and tear (degeneration) of the spinal disks.  Arthritis.  Direct injury to the back. For many people, back pain may return. Since back pain is rarely dangerous, most people can learn to manage this condition on their own. HOME CARE INSTRUCTIONS Watch your back pain for any changes. The following actions may help to lessen any discomfort you are feeling:  Remain active. It is stressful on your back to sit or stand in one place for long periods of time. Do not sit, drive, or stand in one place for more than 30 minutes at a time. Take short walks on even surfaces as soon as you are able.Try to increase the length of time you walk each day.  Exercise regularly as directed by your health care provider. Exercise helps your back heal faster. It also helps avoid future injury by keeping your muscles strong and flexible.  Do not stay in bed.Resting more than 1-2 days can delay your recovery.  Pay attention to your body when you bend and lift. The most comfortable positions are those that put less stress on your recovering back. Always use proper lifting techniques, including:  Bending your knees.  Keeping the load close to your body.  Avoiding twisting.  Find a comfortable position to sleep. Use a firm mattress and lie on your side with your knees slightly bent. If you lie on your back,  put a pillow under your knees.  Avoid feeling anxious or stressed.Stress increases muscle tension and can worsen back pain.It is important to recognize when you are anxious or stressed and learn ways to manage it, such as with exercise.  Take medicines only as directed by your health care provider. Over-the-counter medicines to reduce pain and inflammation are often the most helpful.Your health care provider may prescribe muscle relaxant drugs.These medicines help dull your pain so you can more quickly return to your normal activities and healthy exercise.  Apply ice to the injured area:  Put ice in a plastic bag.  Place a towel between your skin and the bag.  Leave the ice on for 20 minutes, 2-3 times a day for the first 2-3 days. After that, ice and heat may be alternated to reduce pain and spasms.  Maintain a healthy weight. Excess weight puts extra stress on your back and makes it difficult to maintain good posture. SEEK MEDICAL CARE IF:  You have pain that is not relieved with rest or medicine.  You have increasing pain going down into the legs or buttocks.  You have pain that does not improve in one week.  You have night pain.  You lose weight.  You have a fever or chills. SEEK IMMEDIATE MEDICAL CARE IF:   You develop new bowel or bladder control problems.  You have unusual weakness or numbness in your arms or  legs.  You develop nausea or vomiting.  You develop abdominal pain.  You feel faint.   This information is not intended to replace advice given to you by your health care provider. Make sure you discuss any questions you have with your health care provider.   Document Released: 12/29/2004 Document Revised: 01/19/2014 Document Reviewed: 05/02/2013 Elsevier Interactive Patient Education [employer].

## 2014-10-24 NOTE — Progress Notes (Signed)
   HPI  Patient presents today here for evaluation of back pain and discuss hypertension.  Back pain Patient explains that she has chronic intermittent back pain, she's been referred to orthopedic surgery for this but has not had the appointment yet. She has been filling up the appointment due to eye surgery. She describes left-sided low back pain is radiating around to her left buttock that started yesterday after she bent over to pick up a piece of) She denies bowel or bladder dysfunction, saddle anesthesia, or leg weakness. She describes the pain as dull throbbing type pain worse with walking. She's tried some Goody's powder was a little bit of improvement   PMH: Smoking status noted ROS: Per HPI  Objective: BP 147/76 mmHg  Pulse 82  Temp(Src) 97 F (36.1 C) (Oral)  Ht 5\' 2"  (1.575 m)  Wt 164 lb 9.6 oz (74.662 kg)  BMI 30.10 kg/m2 Gen: NAD, alert, cooperative with exam HEENT: NCAT CV: RRR, good S1/S2, no murmur Resp: CTABL, no wheezes, non-labored Abd: SNTND, BS present, no guarding or organomegaly Ext: No edema, warm Neuro: Alert and oriented, 2+ patellar tendon reflexes bilaterally, strength 5/5 in bilateral lower extremities, sensation also intact MSK: No midline tenderness of lumbar spine, positive paraspinal muscle tenderness in the left side, stands carefully, walking carefully as well  Assessment and plan:  # Acute on chronic low back pain Prednisone burst, tramadol for tonight and breakthrough pain Encouraged her to pursue her orthopedic surgery referral for possible epidural injections Reviewed red flags for return  # Hypertension Slightly elevated Increase amlodipine to 10 mg, continue atenolol and HCTZ   Meds ordered this encounter  Medications  . predniSONE (DELTASONE) 20 MG tablet    Sig: Take 2 tabs daily for 4 days, then take 1 tab daily for 4 days, then take one half tab daily  For 4 days then stop.    Dispense:  14 tablet    Refill:  0  .  traMADol (ULTRAM) 50 MG tablet    Sig: Take 1 tablet (50 mg total) by mouth every 8 (eight) hours as needed.    Dispense:  20 tablet    Refill:  0  . amLODipine (NORVASC) 10 MG tablet    Sig: Take 1 tablet (10 mg total) by mouth daily.    Dispense:  90 tablet    Refill:  [place], MD [facility] 10/24/2014, 4:30 PM

## 2014-11-02 ENCOUNTER — Ambulatory Visit: Payer: Medicare Other | Admitting: Cardiovascular Disease

## 2014-11-07 ENCOUNTER — Other Ambulatory Visit: Payer: Self-pay | Admitting: *Deleted

## 2014-11-07 NOTE — Telephone Encounter (Signed)
Last filled 10/25/14 for #20, last seen 10/24/14. Rx will print, if approved

## 2014-11-08 MED ORDER — TRAMADOL HCL 50 MG PO TABS: 50.0000 mg | ORAL_TABLET | Freq: Three times a day (TID) | ORAL | Status: DC | PRN

## 2014-11-08 NOTE — Telephone Encounter (Signed)
Pt aware rx ready to be picked up °

## 2014-11-15 ENCOUNTER — Encounter: Payer: Self-pay | Admitting: Family Medicine

## 2014-11-15 ENCOUNTER — Ambulatory Visit (INDEPENDENT_AMBULATORY_CARE_PROVIDER_SITE_OTHER): Payer: Commercial Managed Care - HMO | Admitting: Family Medicine

## 2014-11-15 VITALS — BP 132/76 | HR 99 | Temp 98.6°F | Ht 62.0 in | Wt 162.8 lb

## 2014-11-15 DIAGNOSIS — I10 Essential (primary) hypertension: Secondary | ICD-10-CM

## 2014-11-15 MED ORDER — LISINOPRIL 20 MG PO TABS: 20.0000 mg | ORAL_TABLET | Freq: Every day | ORAL | Status: DC

## 2014-11-20 NOTE — Progress Notes (Signed)
BP 132/76 mmHg  Pulse 99  Temp(Src) 98.6 F (37 C) (Oral)  Ht _0  (1.575 m)  Wt 162 lb 12.8 oz (73.846 kg)  BMI 29.77 kg/m2   Subjective:    Patient ID: Connie Kent, female    DOB: [birth year], 69 y.o.   MRN: [id number]  HPI: Connie Kent is a 69 y.o. female presenting on 11/15/2014 for Hypertension   HPI Hypertension recheck She is currently on Norvasc 10, hydrochlorothiazide 25. She did take those all today and takes them every day. She is coming in now because home her blood pressure was 147/80 and heart rate was 109 and she was feeling a little sick to her stomach without. She wanted to get her blood pressure rechecked and make sure it was doing okay. Here in our office today her blood pressure is 132/76. She denies any chest pain or shortness of breath.  Relevant past medical, surgical, family and social history reviewed and updated as indicated. Interim medical history since our last visit reviewed. Allergies and medications reviewed and updated.  Review of Systems  Constitutional: Negative for fever and chills.  HENT: Negative for congestion, ear discharge and ear pain.   Eyes: Negative for redness and visual disturbance.  Respiratory: Negative for chest tightness and shortness of breath.   Cardiovascular: Negative for chest pain and leg swelling.  Genitourinary: Negative for dysuria and difficulty urinating.  Musculoskeletal: Negative for back pain and gait problem.  Skin: Negative for rash.  Neurological: Negative for dizziness, light-headedness and headaches.  Psychiatric/Behavioral: Negative for behavioral problems and agitation.  All other systems reviewed and are negative.   Per HPI unless specifically indicated above     Medication List       This list is accurate as of: 11/15/14 11:59 PM.  Always use your most recent med list.               amLODipine 10 MG tablet  Commonly known as:  NORVASC  Take 1 tablet (10 mg total) by mouth daily.     hydrochlorothiazide 25 MG tablet  Commonly known as:  HYDRODIURIL  TAKE 1 TABLET EVERY DAY     ibuprofen 200 MG tablet  Commonly known as:  ADVIL,MOTRIN  Take 600 mg by mouth every 6 (six) hours as needed for moderate pain.     levothyroxine 50 MCG tablet  Commonly known as:  SYNTHROID, LEVOTHROID  Take 1 tablet (50 mcg total) by mouth daily.     lisinopril 20 MG tablet  Commonly known as:  PRINIVIL,ZESTRIL  Take 1 tablet (20 mg total) by mouth daily.     multivitamin with minerals Tabs tablet  Take 1 tablet by mouth daily.     omeprazole 40 MG capsule  Commonly known as:  PRILOSEC  Take 1 capsule (40 mg total) by mouth daily.     ranitidine 300 MG tablet  Commonly known as:  ZANTAC  Take 300 mg by mouth at bedtime.     traMADol 50 MG tablet  Commonly known as:  ULTRAM  Take 1 tablet (50 mg total) by mouth every 8 (eight) hours as needed.           Objective:    BP 132/76 mmHg  Pulse 99  Temp(Src) 98.6 F (37 C) (Oral)  Ht _1  (1.575 m)  Wt 162 lb 12.8 oz (73.846 kg)  BMI 29.77 kg/m2  Wt Readings from Last 3 Encounters:  11/15/14 162 lb 12.8 oz (73.846 kg)  10/24/14 164 lb 9.6 oz (74.662 kg)  09/04/14 168 lb 12.8 oz (76.567 kg)    Physical Exam  Constitutional: She is oriented to person, place, and time. She appears well-developed and well-nourished. No distress.  Eyes: Conjunctivae and EOM are normal. Pupils are equal, round, and reactive to light.  Cardiovascular: Normal rate, regular rhythm, normal heart sounds and intact distal pulses.   No murmur heard. Pulmonary/Chest: Effort normal and breath sounds normal. No respiratory distress. She has no wheezes.  Musculoskeletal: Normal range of motion. She exhibits no edema or tenderness.  Neurological: She is alert and oriented to person, place, and time. Coordination normal.  Skin: Skin is warm and dry. No rash noted. She is not diaphoretic.  Psychiatric: She has a normal mood and affect. Her behavior is  normal.  Nursing note and vitals reviewed.   Results for orders placed or performed in visit on 09/04/14  CMP14+EGFR  Result Value Ref Range   Glucose 91 65 - 99 mg/dL   BUN 18 8 - 27 mg/dL   Creatinine, Ser 0.61 0.57 - 1.00 mg/dL   GFR calc non Af Amer 93 >59 mL/min/1.73   GFR calc Af Amer 108 >59 mL/min/1.73   BUN/Creatinine Ratio 30 (H) 11 - 26   Sodium 140 134 - 144 mmol/L   Potassium 4.9 3.5 - 5.2 mmol/L   Chloride 102 97 - 108 mmol/L   CO2 25 18 - 29 mmol/L   Calcium 9.5 8.7 - 10.3 mg/dL   Total Protein 7.2 6.0 - 8.5 g/dL   Albumin 4.1 3.6 - 4.8 g/dL   Globulin, Total 3.1 1.5 - 4.5 g/dL   Albumin/Globulin Ratio 1.3 1.1 - 2.5   Bilirubin Total 0.4 0.0 - 1.2 mg/dL   Alkaline Phosphatase 74 39 - 117 IU/L   AST 32 0 - 40 IU/L   ALT 27 0 - 32 IU/L  CBC with Differential  Result Value Ref Range   WBC 6.9 3.4 - 10.8 x10E3/uL   RBC 4.07 3.77 - 5.28 x10E6/uL   Hemoglobin 12.3 11.1 - 15.9 g/dL   Hematocrit 37.7 34.0 - 46.6 %   MCV 93 79 - 97 fL   MCH 30.2 26.6 - 33.0 pg   MCHC 32.6 31.5 - 35.7 g/dL   RDW 13.9 12.3 - 15.4 %   Platelets 227 150 - 379 x10E3/uL   Neutrophils 43 %   Lymphs 45 %   Monocytes 8 %   Eos 4 %   Basos 0 %   Neutrophils Absolute 2.9 1.4 - 7.0 x10E3/uL   Lymphocytes Absolute 3.1 0.7 - 3.1 x10E3/uL   Monocytes Absolute 0.6 0.1 - 0.9 x10E3/uL   EOS (ABSOLUTE) 0.3 0.0 - 0.4 x10E3/uL   Basophils Absolute 0.0 0.0 - 0.2 x10E3/uL   Immature Granulocytes 0 %   Immature Grans (Abs) 0.0 0.0 - 0.1 x10E3/uL  TSH  Result Value Ref Range   TSH 3.520 0.450 - 4.500 uIU/mL      Assessment & Plan:   Problem List Items Addressed This Visit      Cardiovascular and Mediastinum   Essential hypertension - Primary    Patient has been having consistently elevated blood pressure before and at home. We'll switch from Tenormin to lisinopril.      Relevant Medications   lisinopril (PRINIVIL,ZESTRIL) 20 MG tablet       Follow up plan: Return in about 4 weeks  (around 12/13/2014), or if symptoms worsen or fail to improve, for htn.  Vonna Kotyk  Kayan Blissett, MD [facility] 11/15/2014, 4:57 PM

## 2014-11-20 NOTE — Assessment & Plan Note (Signed)
Patient has been having consistently elevated blood pressure before and at home. We'll switch from Tenormin to lisinopril.

## 2014-11-23 ENCOUNTER — Telehealth: Payer: Self-pay | Admitting: Family

## 2014-11-23 NOTE — Telephone Encounter (Signed)
Pt needs to be seen. We do not want BP to be too elevated

## 2014-11-23 NOTE — Telephone Encounter (Signed)
Pt aware of Connie Kent recommendations of being seen. She stopped the lisinopril and went back on the amlodipine and feels better. Has an appt w/ the nurse on 11/17 for BP rck and will keep a check on it herself until then.

## 2014-11-29 ENCOUNTER — Ambulatory Visit (INDEPENDENT_AMBULATORY_CARE_PROVIDER_SITE_OTHER): Payer: Commercial Managed Care - HMO | Admitting: *Deleted

## 2014-11-29 VITALS — BP 113/61 | HR 97

## 2014-11-29 DIAGNOSIS — I10 Essential (primary) hypertension: Secondary | ICD-10-CM

## 2014-11-29 NOTE — Progress Notes (Signed)
Patient is here today for a BP recheck. Patients BP 113/61 mmHg  Pulse 97

## 2014-11-29 NOTE — Patient Instructions (Signed)
Hypertension Hypertension, commonly called high blood pressure, is when the force of blood pumping through your arteries is too strong. Your arteries are the blood vessels that carry blood from your heart throughout your body. A blood pressure reading consists of a higher number over a lower number, such as 110/72. The higher number (systolic) is the pressure inside your arteries when your heart pumps. The lower number (diastolic) is the pressure inside your arteries when your heart relaxes. Ideally you want your blood pressure below 120/80. Hypertension forces your heart to work harder to pump blood. Your arteries may become narrow or stiff. Having untreated or uncontrolled hypertension can cause heart attack, stroke, kidney disease, and other problems. RISK FACTORS Some risk factors for high blood pressure are controllable. Others are not.  Risk factors you cannot control include:   Race. You may be at higher risk if you are African American.  Age. Risk increases with age.  Gender. Men are at higher risk than women before age 45 years. After age 65, women are at higher risk than men. Risk factors you can control include:  Not getting enough exercise or physical activity.  Being overweight.  Getting too much fat, sugar, calories, or salt in your diet.  Drinking too much alcohol. SIGNS AND SYMPTOMS Hypertension does not usually cause signs or symptoms. Extremely high blood pressure (hypertensive crisis) may cause headache, anxiety, shortness of breath, and nosebleed. DIAGNOSIS To check if you have hypertension, your health care provider will measure your blood pressure while you are seated, with your arm held at the level of your heart. It should be measured at least twice using the same arm. Certain conditions can cause a difference in blood pressure between your right and left arms. A blood pressure reading that is higher than normal on one occasion does not mean that you need treatment. If  it is not clear whether you have high blood pressure, you may be asked to return on a different day to have your blood pressure checked again. Or, you may be asked to monitor your blood pressure at home for 1 or more weeks. TREATMENT Treating high blood pressure includes making lifestyle changes and possibly taking medicine. Living a healthy lifestyle can help lower high blood pressure. You may need to change some of your habits. Lifestyle changes may include:  Following the DASH diet. This diet is high in fruits, vegetables, and whole grains. It is low in salt, red meat, and added sugars.  Keep your sodium intake below 2,300 mg per day.  Getting at least 30-45 minutes of aerobic exercise at least 4 times per week.  Losing weight if necessary.  Not smoking.  Limiting alcoholic beverages.  Learning ways to reduce stress. Your health care provider may prescribe medicine if lifestyle changes are not enough to get your blood pressure under control, and if one of the following is true:  You are 18-59 years of age and your systolic blood pressure is above 140.  You are 60 years of age or older, and your systolic blood pressure is above 150.  Your diastolic blood pressure is above 90.  You have diabetes, and your systolic blood pressure is over 140 or your diastolic blood pressure is over 90.  You have kidney disease and your blood pressure is above 140/90.  You have heart disease and your blood pressure is above 140/90. Your personal target blood pressure may vary depending on your medical conditions, your age, and other factors. HOME CARE INSTRUCTIONS    Have your blood pressure rechecked as directed by your health care provider.   Take medicines only as directed by your health care provider. Follow the directions carefully. Blood pressure medicines must be taken as prescribed. The medicine does not work as well when you skip doses. Skipping doses also puts you at risk for  problems.  Do not smoke.   Monitor your blood pressure at home as directed by your health care provider. SEEK MEDICAL CARE IF:   You think you are having a reaction to medicines taken.  You have recurrent headaches or feel dizzy.  You have swelling in your ankles.  You have trouble with your vision. SEEK IMMEDIATE MEDICAL CARE IF:  You develop a severe headache or confusion.  You have unusual weakness, numbness, or feel faint.  You have severe chest or abdominal pain.  You vomit repeatedly.  You have trouble breathing. MAKE SURE YOU:   Understand these instructions.  Will watch your condition.  Will get help right away if you are not doing well or get worse.   This information is not intended to replace advice given to you by your health care provider. Make sure you discuss any questions you have with your health care provider.   Document Released: 12/29/2004 Document Revised: 05/15/2014 Document Reviewed: 10/21/2012 Elsevier Interactive Patient Education 2016 [employer]  

## 2014-12-17 ENCOUNTER — Ambulatory Visit (INDEPENDENT_AMBULATORY_CARE_PROVIDER_SITE_OTHER): Payer: Commercial Managed Care - HMO | Admitting: Family Medicine

## 2014-12-17 ENCOUNTER — Encounter: Payer: Self-pay | Admitting: Family Medicine

## 2014-12-17 ENCOUNTER — Telehealth: Payer: Self-pay | Admitting: Family Medicine

## 2014-12-17 VITALS — BP 136/79 | HR 81 | Temp 97.3°F | Ht 62.0 in | Wt 167.4 lb

## 2014-12-17 DIAGNOSIS — I10 Essential (primary) hypertension: Secondary | ICD-10-CM | POA: Diagnosis not present

## 2014-12-17 DIAGNOSIS — Z1211 Encounter for screening for malignant neoplasm of colon: Secondary | ICD-10-CM | POA: Diagnosis not present

## 2014-12-17 DIAGNOSIS — R0602 Shortness of breath: Secondary | ICD-10-CM

## 2014-12-17 NOTE — Assessment & Plan Note (Signed)
Patient has been off all blood pressure medications for the past 3 days and blood pressure today is 136/79. She also has home readings that are in the similar range. She did not feel like her medications were helping her. We will hold off on the medications for now and have her back in 4 weeks and see where her blood pressure is.

## 2014-12-17 NOTE — Telephone Encounter (Signed)
Stp and she needs a chest x-ray. Advised pt to come back first thing in the morning.

## 2014-12-17 NOTE — Progress Notes (Signed)
BP 136/79 mmHg  Pulse 81  Temp(Src) 97.3 F (36.3 C) (Oral)  Ht 5' 2"  (1.575 m)  Wt 167 lb 6.4 oz (75.932 kg)  BMI 30.61 kg/m2  SpO2 98%   Subjective:    Patient ID: Connie Kent, female    DOB: 03/26/1945, 69 y.o.   MRN: [id number]  HPI: Connie Kent is a 69 y.o. female presenting on 12/17/2014 for Hypertension and Cough, shortness of breath   HPI Hypertension recheck Patient comes in for hypertension recheck. She stopped her blood pressure medications 3 or 4 days ago because she felt like they were not doing a good and did not like to eliminate her feel. Her blood pressures 136/79 today and has been running a round that over the past couple days since she's been off her medications.  Cough and shortness of breath Patient has been having cough and shortness of breath that is been increasing over the past week. She denies any fevers or chills. The cough is productive of yellow-green sputum. She has tried some home over-the-counter Tylenol and sinus medication without much success. She denies any major smoking history but does admit that she was around smokers most of her life. She denies any previous issues with asthma or wheezing and does not know she's been wheezing with this episode. This is been going on for one to 2 weeks. She is also increased shortness of breath on going up stairs and walking far distances.  Relevant past medical, surgical, family and social history reviewed and updated as indicated. Interim medical history since our last visit reviewed. Allergies and medications reviewed and updated.  Review of Systems  Constitutional: Negative for fever and chills.  HENT: Negative for congestion, ear discharge and ear pain.   Eyes: Negative for redness and visual disturbance.  Respiratory: Positive for cough, chest tightness and shortness of breath.   Cardiovascular: Negative for chest pain, palpitations and leg swelling.  Genitourinary: Negative for dysuria and  difficulty urinating.  Musculoskeletal: Negative for back pain and gait problem.  Skin: Negative for rash.  Neurological: Negative for dizziness, light-headedness and headaches.  Psychiatric/Behavioral: Negative for behavioral problems and agitation.  All other systems reviewed and are negative.   Per HPI unless specifically indicated above     Medication List       This list is accurate as of: 12/17/14  1:38 PM.  Always use your most recent med list.               Fish Oil 1000 MG Caps  Take 1,000 mg by mouth.     ibuprofen 200 MG tablet  Commonly known as:  ADVIL,MOTRIN  Take 600 mg by mouth every 6 (six) hours as needed for moderate pain.     levothyroxine 50 MCG tablet  Commonly known as:  SYNTHROID, LEVOTHROID  Take 1 tablet (50 mcg total) by mouth daily.     lisinopril 20 MG tablet  Commonly known as:  PRINIVIL,ZESTRIL  Take 1 tablet (20 mg total) by mouth daily.     multivitamin with minerals Tabs tablet  Take 1 tablet by mouth daily.     omeprazole 40 MG capsule  Commonly known as:  PRILOSEC  Take 1 capsule (40 mg total) by mouth daily.     traMADol 50 MG tablet  Commonly known as:  ULTRAM  Take 1 tablet (50 mg total) by mouth every 8 (eight) hours as needed.     vitamin E 100 UNIT capsule  Take  by mouth daily.           Objective:    BP 136/79 mmHg  Pulse 81  Temp(Src) 97.3 F (36.3 C) (Oral)  Ht 5' 2"  (1.575 m)  Wt 167 lb 6.4 oz (75.932 kg)  BMI 30.61 kg/m2  SpO2 98%  Wt Readings from Last 3 Encounters:  12/17/14 167 lb 6.4 oz (75.932 kg)  11/15/14 162 lb 12.8 oz (73.846 kg)  10/24/14 164 lb 9.6 oz (74.662 kg)    Physical Exam  Constitutional: She is oriented to person, place, and time. She appears well-developed and well-nourished. No distress.  HENT:  Right Ear: External ear normal.  Left Ear: External ear normal.  Nose: Nose normal.  Mouth/Throat: Oropharynx is clear and moist. No oropharyngeal exudate.  Eyes: Conjunctivae and  EOM are normal. Pupils are equal, round, and reactive to light.  Neck: Neck supple. No thyromegaly present.  Cardiovascular: Normal rate, regular rhythm, normal heart sounds and intact distal pulses.   No murmur heard. Pulmonary/Chest: Effort normal and breath sounds normal. No respiratory distress. She has no wheezes.  Musculoskeletal: Normal range of motion. She exhibits no edema or tenderness.  Lymphadenopathy:    She has no cervical adenopathy.  Neurological: She is alert and oriented to person, place, and time. Coordination normal.  Skin: Skin is warm and dry. No rash noted. She is not diaphoretic.  Psychiatric: She has a normal mood and affect. Her behavior is normal.  Nursing note and vitals reviewed.   Results for orders placed or performed in visit on 09/04/14  CMP14+EGFR  Result Value Ref Range   Glucose 91 65 - 99 mg/dL   BUN 18 8 - 27 mg/dL   Creatinine, Ser 0.61 0.57 - 1.00 mg/dL   GFR calc non Af Amer 93 >59 mL/min/1.73   GFR calc Af Amer 108 >59 mL/min/1.73   BUN/Creatinine Ratio 30 (H) 11 - 26   Sodium 140 134 - 144 mmol/L   Potassium 4.9 3.5 - 5.2 mmol/L   Chloride 102 97 - 108 mmol/L   CO2 25 18 - 29 mmol/L   Calcium 9.5 8.7 - 10.3 mg/dL   Total Protein 7.2 6.0 - 8.5 g/dL   Albumin 4.1 3.6 - 4.8 g/dL   Globulin, Total 3.1 1.5 - 4.5 g/dL   Albumin/Globulin Ratio 1.3 1.1 - 2.5   Bilirubin Total 0.4 0.0 - 1.2 mg/dL   Alkaline Phosphatase 74 39 - 117 IU/L   AST 32 0 - 40 IU/L   ALT 27 0 - 32 IU/L  CBC with Differential  Result Value Ref Range   WBC 6.9 3.4 - 10.8 x10E3/uL   RBC 4.07 3.77 - 5.28 x10E6/uL   Hemoglobin 12.3 11.1 - 15.9 g/dL   Hematocrit 37.7 34.0 - 46.6 %   MCV 93 79 - 97 fL   MCH 30.2 26.6 - 33.0 pg   MCHC 32.6 31.5 - 35.7 g/dL   RDW 13.9 12.3 - 15.4 %   Platelets 227 150 - 379 x10E3/uL   Neutrophils 43 %   Lymphs 45 %   Monocytes 8 %   Eos 4 %   Basos 0 %   Neutrophils Absolute 2.9 1.4 - 7.0 x10E3/uL   Lymphocytes Absolute 3.1 0.7 -  3.1 x10E3/uL   Monocytes Absolute 0.6 0.1 - 0.9 x10E3/uL   EOS (ABSOLUTE) 0.3 0.0 - 0.4 x10E3/uL   Basophils Absolute 0.0 0.0 - 0.2 x10E3/uL   Immature Granulocytes 0 %   Immature Grans (Abs) 0.0  0.0 - 0.1 x10E3/uL  TSH  Result Value Ref Range   TSH 3.520 0.450 - 4.500 uIU/mL      Assessment & Plan:   Problem List Items Addressed This Visit      Cardiovascular and Mediastinum   Essential hypertension - Primary    Patient has been off all blood pressure medications for the past 3 days and blood pressure today is 136/79. She also has home readings that are in the similar range. She did not feel like her medications were helping her. We will hold off on the medications for now and have her back in 4 weeks and see where her blood pressure is.       Other Visit Diagnoses    Screen for colon cancer        Relevant Orders    Ambulatory referral to Gastroenterology    Shortness of breath        Increased shortness of breath and chest tightness with ambulation. We'll do a chest x-ray. She denies fevers or chills. She does have a productive cough.    Relevant Orders    DG Chest 2 View        Follow up plan: Return in about 4 weeks (around 01/14/2015), or if symptoms worsen or fail to improve, for Hypertension recheck.  Counseling provided for all of the vaccine components Orders Placed This Encounter  Procedures  . DG Chest 2 View  . Ambulatory referral to Gastroenterology    Caryl Pina, MD [facility] 12/17/2014, 1:38 PM

## 2014-12-18 ENCOUNTER — Encounter (INDEPENDENT_AMBULATORY_CARE_PROVIDER_SITE_OTHER): Payer: Self-pay | Admitting: *Deleted

## 2014-12-18 ENCOUNTER — Other Ambulatory Visit (INDEPENDENT_AMBULATORY_CARE_PROVIDER_SITE_OTHER): Payer: Commercial Managed Care - HMO

## 2014-12-18 DIAGNOSIS — R0602 Shortness of breath: Secondary | ICD-10-CM | POA: Diagnosis not present

## 2015-01-16 ENCOUNTER — Ambulatory Visit: Payer: Commercial Managed Care - HMO | Admitting: Family Medicine

## 2015-01-31 ENCOUNTER — Ambulatory Visit (INDEPENDENT_AMBULATORY_CARE_PROVIDER_SITE_OTHER): Payer: Commercial Managed Care - HMO | Admitting: Family Medicine

## 2015-01-31 ENCOUNTER — Encounter: Payer: Self-pay | Admitting: Family Medicine

## 2015-01-31 VITALS — BP 135/85 | HR 94 | Temp 97.2°F | Ht 62.0 in | Wt 167.6 lb

## 2015-01-31 DIAGNOSIS — Z1211 Encounter for screening for malignant neoplasm of colon: Secondary | ICD-10-CM

## 2015-01-31 DIAGNOSIS — R1031 Right lower quadrant pain: Secondary | ICD-10-CM

## 2015-01-31 DIAGNOSIS — N3000 Acute cystitis without hematuria: Secondary | ICD-10-CM

## 2015-01-31 DIAGNOSIS — K219 Gastro-esophageal reflux disease without esophagitis: Secondary | ICD-10-CM

## 2015-01-31 LAB — POCT URINALYSIS DIPSTICK
Bilirubin, UA: NEGATIVE
Glucose, UA: NEGATIVE
Ketones, UA: NEGATIVE
Nitrite, UA: NEGATIVE
PH UA: 5
PROTEIN UA: NEGATIVE
RBC UA: NEGATIVE
SPEC GRAV UA: 1.02
UROBILINOGEN UA: NEGATIVE

## 2015-01-31 LAB — POCT UA - MICROSCOPIC ONLY
Bacteria, U Microscopic: NEGATIVE
CASTS, UR, LPF, POC: NEGATIVE
CRYSTALS, UR, HPF, POC: NEGATIVE
Mucus, UA: NEGATIVE
RBC, URINE, MICROSCOPIC: NEGATIVE
Yeast, UA: NEGATIVE

## 2015-01-31 MED ORDER — LEVOTHYROXINE SODIUM 50 MCG PO TABS: 50.0000 ug | ORAL_TABLET | Freq: Every day | ORAL | Status: DC

## 2015-01-31 MED ORDER — OMEPRAZOLE 40 MG PO CPDR: 40.0000 mg | DELAYED_RELEASE_CAPSULE | Freq: Every day | ORAL | Status: DC

## 2015-01-31 MED ORDER — CIPROFLOXACIN HCL 500 MG PO TABS: 500.0000 mg | ORAL_TABLET | Freq: Two times a day (BID) | ORAL | Status: DC

## 2015-01-31 NOTE — Progress Notes (Signed)
BP 135/85 mmHg  Pulse 94  Temp(Src) 97.2 F (36.2 C) (Oral)  Ht 5\' 2"  (1.575 m)  Wt 167 lb 9.6 oz (76.023 kg)  BMI 30.65 kg/m2   Subjective:    Patient ID: Connie Kent, female    DOB: 1945-02-22, 70 y.o.   MRN: [id number]  HPI: Connie Kent is a 70 y.o. female presenting on 01/31/2015 for Annual Exam   HPI Abdominal pain Patient has been having right-sided and especially right lower quadrant abdominal pain that radiates up into her right flank. This is been going on for the past 2 days. She denies any fevers or chills. She does complain of some dysuria but denies any hematuria. The abdominal pain is been kind of crampy and bloating in nature. She also complains of epigastric abdominal pain that is similar to her heartburn or reflux that she has had previously. She is recently started backing up on her omeprazole and thinks that may have triggered some of that. She denies any issues with appetite or diarrhea or constipation or blood in her stool or nausea or vomiting.  Relevant past medical, surgical, family and social history reviewed and updated as indicated. Interim medical history since our last visit reviewed. Allergies and medications reviewed and updated.  Review of Systems  Constitutional: Negative for fever and chills.  HENT: Negative for congestion, ear discharge and ear pain.   Eyes: Negative for redness and visual disturbance.  Respiratory: Negative for chest tightness and shortness of breath.   Cardiovascular: Negative for chest pain and leg swelling.  Gastrointestinal: Positive for abdominal pain. Negative for nausea, vomiting, diarrhea, constipation and blood in stool.  Genitourinary: Positive for dysuria and urgency. Negative for hematuria, decreased urine volume, vaginal bleeding, vaginal discharge, difficulty urinating and vaginal pain.  Musculoskeletal: Negative for back pain and gait problem.  Skin: Negative for rash.  Neurological: Negative for  light-headedness and headaches.  Psychiatric/Behavioral: Negative for behavioral problems and agitation.  All other systems reviewed and are negative.   Per HPI unless specifically indicated above     Medication List       This list is accurate as of: 01/31/15 11:55 AM.  Always use your most recent med list.               ciprofloxacin 500 MG tablet  Commonly known as:  CIPRO  Take 1 tablet (500 mg total) by mouth 2 (two) times daily.     Fish Oil 1000 MG Caps  Take 1,000 mg by mouth.     ibuprofen 200 MG tablet  Commonly known as:  ADVIL,MOTRIN  Take 600 mg by mouth every 6 (six) hours as needed for moderate pain.     levothyroxine 50 MCG tablet  Commonly known as:  SYNTHROID, LEVOTHROID  Take 1 tablet (50 mcg total) by mouth daily.     multivitamin with minerals Tabs tablet  Take 1 tablet by mouth daily.     omeprazole 40 MG capsule  Commonly known as:  PRILOSEC  Take 1 capsule (40 mg total) by mouth daily.     vitamin E 100 UNIT capsule  Take by mouth daily.           Objective:    BP 135/85 mmHg  Pulse 94  Temp(Src) 97.2 F (36.2 C) (Oral)  Ht 5\' 2"  (1.575 m)  Wt 167 lb 9.6 oz (76.023 kg)  BMI 30.65 kg/m2  Wt Readings from Last 3 Encounters:  01/31/15 167 lb 9.6 oz (76.023  kg)  12/17/14 167 lb 6.4 oz (75.932 kg)  11/15/14 162 lb 12.8 oz (73.846 kg)    Physical Exam  Constitutional: She is oriented to person, place, and time. She appears well-developed and well-nourished. No distress.  Eyes: Conjunctivae and EOM are normal. Pupils are equal, round, and reactive to light.  Cardiovascular: Normal rate, regular rhythm, normal heart sounds and intact distal pulses.   No murmur heard. Pulmonary/Chest: Effort normal and breath sounds normal. No respiratory distress. She has no wheezes.  Abdominal: Soft. Bowel sounds are normal. She exhibits no distension. There is no hepatosplenomegaly. There is tenderness in the right lower quadrant, epigastric area and  suprapubic area. There is no rebound, no guarding, no CVA tenderness, no tenderness at McBurney's point and negative Murphy's sign.  Musculoskeletal: Normal range of motion. She exhibits no edema or tenderness.  Neurological: She is alert and oriented to person, place, and time. Coordination normal.  Skin: Skin is warm and dry. No rash noted. She is not diaphoretic.  Psychiatric: She has a normal mood and affect. Her behavior is normal.  Nursing note and vitals reviewed.   Results for orders placed or performed in visit on 01/31/15  POCT UA - Microscopic Only  Result Value Ref Range   WBC, Ur, HPF, POC 1-5    RBC, urine, microscopic negative    Bacteria, U Microscopic negative    Mucus, UA negative    Epithelial cells, urine per micros occ    Crystals, Ur, HPF, POC negative    Casts, Ur, LPF, POC negative    Yeast, UA negaitive   POCT urinalysis dipstick  Result Value Ref Range   Color, UA yellow    Clarity, UA clear    Glucose, UA negative    Bilirubin, UA negative    Ketones, UA negative    Spec Grav, UA 1.020    Blood, UA negative    pH, UA 5.0    Protein, UA negative    Urobilinogen, UA negative    Nitrite, UA negative    Leukocytes, UA Trace (A) Negative      Assessment & Plan:   Problem List Items Addressed This Visit      Digestive   GERD - Primary    Continue prilosec, mostly controlled inn prilsec      Relevant Medications   omeprazole (PRILOSEC) 40 MG capsule    Other Visit Diagnoses    Screening for colon cancer        Relevant Orders    Ambulatory referral to Gastroenterology    Abdominal pain, right lower quadrant        radiates to right flank    Relevant Orders    POCT UA - Microscopic Only (Completed)    POCT urinalysis dipstick (Completed)    Acute cystitis without hematuria        Relevant Medications    ciprofloxacin (CIPRO) 500 MG tablet        Follow up plan: Return in about 3 months (around 05/01/2015), or if symptoms worsen or fail  to improve, for f/u thyroid.  Counseling provided for all of the vaccine components Orders Placed This Encounter  Procedures  . Ambulatory referral to Gastroenterology  . POCT UA - Microscopic Only  . POCT urinalysis dipstick    Caryl Pina, MD [facility] 01/31/2015, 11:55 AM

## 2015-01-31 NOTE — Assessment & Plan Note (Signed)
Continue prilosec, mostly controlled inn prilsec

## 2015-02-05 ENCOUNTER — Other Ambulatory Visit (INDEPENDENT_AMBULATORY_CARE_PROVIDER_SITE_OTHER): Payer: Self-pay | Admitting: *Deleted

## 2015-02-05 DIAGNOSIS — Z1211 Encounter for screening for malignant neoplasm of colon: Secondary | ICD-10-CM

## 2015-02-06 ENCOUNTER — Telehealth: Payer: Self-pay | Admitting: Family Medicine

## 2015-02-06 NOTE — Telephone Encounter (Signed)
Patient given appointment for tomorrow with [facility].

## 2015-02-07 ENCOUNTER — Ambulatory Visit: Payer: Commercial Managed Care - HMO | Admitting: Family Medicine

## 2015-02-08 ENCOUNTER — Encounter: Payer: Self-pay | Admitting: Family Medicine

## 2015-02-22 ENCOUNTER — Other Ambulatory Visit (INDEPENDENT_AMBULATORY_CARE_PROVIDER_SITE_OTHER): Payer: Self-pay | Admitting: *Deleted

## 2015-02-22 ENCOUNTER — Encounter (INDEPENDENT_AMBULATORY_CARE_PROVIDER_SITE_OTHER): Payer: Self-pay | Admitting: *Deleted

## 2015-02-22 MED ORDER — PEG 3350-KCL-NA BICARB-NACL 420 G PO SOLR: 4000.0000 mL | Freq: Once | ORAL | Status: DC

## 2015-02-22 NOTE — Telephone Encounter (Signed)
Patient needs trilyte 

## 2015-03-04 DIAGNOSIS — Z0100 Encounter for examination of eyes and vision without abnormal findings: Secondary | ICD-10-CM | POA: Diagnosis not present

## 2015-03-11 ENCOUNTER — Ambulatory Visit: Payer: Commercial Managed Care - HMO | Admitting: Family Medicine

## 2015-03-13 ENCOUNTER — Encounter: Payer: Self-pay | Admitting: Family Medicine

## 2015-04-11 ENCOUNTER — Ambulatory Visit (HOSPITAL_COMMUNITY): Admit: 2015-04-11 | Payer: Medicare Other | Admitting: Internal Medicine

## 2015-04-11 ENCOUNTER — Encounter (HOSPITAL_COMMUNITY): Payer: Self-pay

## 2015-04-11 SURGERY — COLONOSCOPY
Anesthesia: Moderate Sedation

## 2015-05-07 DIAGNOSIS — I208 Other forms of angina pectoris: Secondary | ICD-10-CM | POA: Diagnosis not present

## 2015-05-07 DIAGNOSIS — E038 Other specified hypothyroidism: Secondary | ICD-10-CM | POA: Diagnosis not present

## 2015-05-07 DIAGNOSIS — K210 Gastro-esophageal reflux disease with esophagitis: Secondary | ICD-10-CM | POA: Diagnosis not present

## 2015-05-07 DIAGNOSIS — Z1159 Encounter for screening for other viral diseases: Secondary | ICD-10-CM | POA: Diagnosis not present

## 2015-05-07 DIAGNOSIS — Z6831 Body mass index (BMI) 31.0-31.9, adult: Secondary | ICD-10-CM | POA: Diagnosis not present

## 2015-05-07 DIAGNOSIS — I10 Essential (primary) hypertension: Secondary | ICD-10-CM | POA: Diagnosis not present

## 2015-05-10 DIAGNOSIS — I208 Other forms of angina pectoris: Secondary | ICD-10-CM | POA: Diagnosis not present

## 2015-05-10 DIAGNOSIS — R079 Chest pain, unspecified: Secondary | ICD-10-CM | POA: Diagnosis not present

## 2015-06-30 DIAGNOSIS — M5416 Radiculopathy, lumbar region: Secondary | ICD-10-CM | POA: Diagnosis not present

## 2015-06-30 DIAGNOSIS — F17200 Nicotine dependence, unspecified, uncomplicated: Secondary | ICD-10-CM | POA: Diagnosis not present

## 2015-06-30 DIAGNOSIS — M545 Low back pain: Secondary | ICD-10-CM | POA: Diagnosis not present

## 2015-08-17 DIAGNOSIS — Z79899 Other long term (current) drug therapy: Secondary | ICD-10-CM | POA: Diagnosis not present

## 2015-08-17 DIAGNOSIS — R110 Nausea: Secondary | ICD-10-CM | POA: Diagnosis not present

## 2015-08-17 DIAGNOSIS — Z720 Tobacco use: Secondary | ICD-10-CM | POA: Diagnosis not present

## 2015-08-17 DIAGNOSIS — I10 Essential (primary) hypertension: Secondary | ICD-10-CM | POA: Diagnosis not present

## 2015-08-17 DIAGNOSIS — F17200 Nicotine dependence, unspecified, uncomplicated: Secondary | ICD-10-CM | POA: Diagnosis not present

## 2015-08-17 DIAGNOSIS — R51 Headache: Secondary | ICD-10-CM | POA: Diagnosis not present

## 2015-08-19 DIAGNOSIS — I10 Essential (primary) hypertension: Secondary | ICD-10-CM | POA: Diagnosis not present

## 2015-08-19 DIAGNOSIS — E038 Other specified hypothyroidism: Secondary | ICD-10-CM | POA: Diagnosis not present

## 2015-08-19 DIAGNOSIS — K210 Gastro-esophageal reflux disease with esophagitis: Secondary | ICD-10-CM | POA: Diagnosis not present

## 2015-08-19 DIAGNOSIS — I208 Other forms of angina pectoris: Secondary | ICD-10-CM | POA: Diagnosis not present

## 2015-08-22 DIAGNOSIS — M545 Low back pain: Secondary | ICD-10-CM | POA: Diagnosis not present

## 2015-08-22 DIAGNOSIS — K210 Gastro-esophageal reflux disease with esophagitis: Secondary | ICD-10-CM | POA: Diagnosis not present

## 2015-08-22 DIAGNOSIS — E038 Other specified hypothyroidism: Secondary | ICD-10-CM | POA: Diagnosis not present

## 2015-08-22 DIAGNOSIS — I10 Essential (primary) hypertension: Secondary | ICD-10-CM | POA: Diagnosis not present

## 2015-08-22 DIAGNOSIS — Z6830 Body mass index (BMI) 30.0-30.9, adult: Secondary | ICD-10-CM | POA: Diagnosis not present

## 2015-09-18 DIAGNOSIS — I10 Essential (primary) hypertension: Secondary | ICD-10-CM | POA: Diagnosis not present

## 2015-09-18 DIAGNOSIS — K200 Eosinophilic esophagitis: Secondary | ICD-10-CM | POA: Diagnosis not present

## 2015-09-18 DIAGNOSIS — I208 Other forms of angina pectoris: Secondary | ICD-10-CM | POA: Diagnosis not present

## 2015-09-18 DIAGNOSIS — E038 Other specified hypothyroidism: Secondary | ICD-10-CM | POA: Diagnosis not present

## 2015-09-24 DIAGNOSIS — Z7982 Long term (current) use of aspirin: Secondary | ICD-10-CM | POA: Diagnosis not present

## 2015-09-24 DIAGNOSIS — K219 Gastro-esophageal reflux disease without esophagitis: Secondary | ICD-10-CM | POA: Diagnosis not present

## 2015-09-24 DIAGNOSIS — Z780 Asymptomatic menopausal state: Secondary | ICD-10-CM | POA: Diagnosis not present

## 2015-09-24 DIAGNOSIS — E039 Hypothyroidism, unspecified: Secondary | ICD-10-CM | POA: Diagnosis not present

## 2015-09-24 DIAGNOSIS — I10 Essential (primary) hypertension: Secondary | ICD-10-CM | POA: Diagnosis not present

## 2015-09-24 DIAGNOSIS — R079 Chest pain, unspecified: Secondary | ICD-10-CM | POA: Diagnosis not present

## 2015-09-25 DIAGNOSIS — I10 Essential (primary) hypertension: Secondary | ICD-10-CM | POA: Diagnosis not present

## 2015-09-25 DIAGNOSIS — K219 Gastro-esophageal reflux disease without esophagitis: Secondary | ICD-10-CM | POA: Diagnosis not present

## 2015-09-25 DIAGNOSIS — R079 Chest pain, unspecified: Secondary | ICD-10-CM | POA: Diagnosis not present

## 2015-09-25 DIAGNOSIS — Z780 Asymptomatic menopausal state: Secondary | ICD-10-CM | POA: Diagnosis not present

## 2015-09-25 DIAGNOSIS — E039 Hypothyroidism, unspecified: Secondary | ICD-10-CM | POA: Diagnosis not present

## 2015-09-25 DIAGNOSIS — Z7982 Long term (current) use of aspirin: Secondary | ICD-10-CM | POA: Diagnosis not present

## 2015-10-07 ENCOUNTER — Other Ambulatory Visit: Payer: Self-pay | Admitting: Family Medicine

## 2015-10-07 DIAGNOSIS — K219 Gastro-esophageal reflux disease without esophagitis: Secondary | ICD-10-CM

## 2015-10-08 DIAGNOSIS — M545 Low back pain: Secondary | ICD-10-CM | POA: Diagnosis not present

## 2015-10-08 DIAGNOSIS — Z6830 Body mass index (BMI) 30.0-30.9, adult: Secondary | ICD-10-CM | POA: Diagnosis not present

## 2015-10-08 DIAGNOSIS — E038 Other specified hypothyroidism: Secondary | ICD-10-CM | POA: Diagnosis not present

## 2015-10-08 DIAGNOSIS — K210 Gastro-esophageal reflux disease with esophagitis: Secondary | ICD-10-CM | POA: Diagnosis not present

## 2015-10-08 DIAGNOSIS — I10 Essential (primary) hypertension: Secondary | ICD-10-CM | POA: Diagnosis not present

## 2015-10-23 DIAGNOSIS — R42 Dizziness and giddiness: Secondary | ICD-10-CM | POA: Diagnosis not present

## 2015-10-23 DIAGNOSIS — Z87891 Personal history of nicotine dependence: Secondary | ICD-10-CM | POA: Diagnosis not present

## 2015-10-23 DIAGNOSIS — R079 Chest pain, unspecified: Secondary | ICD-10-CM | POA: Diagnosis not present

## 2015-10-23 DIAGNOSIS — Z79899 Other long term (current) drug therapy: Secondary | ICD-10-CM | POA: Diagnosis not present

## 2015-10-23 DIAGNOSIS — R531 Weakness: Secondary | ICD-10-CM | POA: Diagnosis not present

## 2015-10-23 DIAGNOSIS — R61 Generalized hyperhidrosis: Secondary | ICD-10-CM | POA: Diagnosis not present

## 2015-10-29 DIAGNOSIS — E038 Other specified hypothyroidism: Secondary | ICD-10-CM | POA: Diagnosis not present

## 2015-10-29 DIAGNOSIS — K200 Eosinophilic esophagitis: Secondary | ICD-10-CM | POA: Diagnosis not present

## 2015-10-29 DIAGNOSIS — I208 Other forms of angina pectoris: Secondary | ICD-10-CM | POA: Diagnosis not present

## 2015-10-29 DIAGNOSIS — I10 Essential (primary) hypertension: Secondary | ICD-10-CM | POA: Diagnosis not present

## 2015-11-25 DIAGNOSIS — K200 Eosinophilic esophagitis: Secondary | ICD-10-CM | POA: Diagnosis not present

## 2015-11-25 DIAGNOSIS — I10 Essential (primary) hypertension: Secondary | ICD-10-CM | POA: Diagnosis not present

## 2015-11-25 DIAGNOSIS — E038 Other specified hypothyroidism: Secondary | ICD-10-CM | POA: Diagnosis not present

## 2015-11-25 DIAGNOSIS — I208 Other forms of angina pectoris: Secondary | ICD-10-CM | POA: Diagnosis not present

## 2016-06-09 ENCOUNTER — Encounter: Payer: Self-pay | Admitting: Internal Medicine

## 2016-06-26 ENCOUNTER — Ambulatory Visit: Payer: Medicare Other | Admitting: Gastroenterology

## 2016-08-28 ENCOUNTER — Ambulatory Visit: Payer: Self-pay | Admitting: Gastroenterology

## 2017-10-26 DIAGNOSIS — Z299 Encounter for prophylactic measures, unspecified: Secondary | ICD-10-CM | POA: Diagnosis not present

## 2017-10-26 DIAGNOSIS — E039 Hypothyroidism, unspecified: Secondary | ICD-10-CM | POA: Diagnosis not present

## 2017-10-26 DIAGNOSIS — Z23 Encounter for immunization: Secondary | ICD-10-CM | POA: Diagnosis not present

## 2017-10-26 DIAGNOSIS — M549 Dorsalgia, unspecified: Secondary | ICD-10-CM | POA: Diagnosis not present

## 2017-10-26 DIAGNOSIS — I10 Essential (primary) hypertension: Secondary | ICD-10-CM | POA: Diagnosis not present

## 2017-10-26 DIAGNOSIS — Z6832 Body mass index (BMI) 32.0-32.9, adult: Secondary | ICD-10-CM | POA: Diagnosis not present

## 2017-10-28 DIAGNOSIS — M47816 Spondylosis without myelopathy or radiculopathy, lumbar region: Secondary | ICD-10-CM | POA: Diagnosis not present

## 2017-10-28 DIAGNOSIS — M4726 Other spondylosis with radiculopathy, lumbar region: Secondary | ICD-10-CM | POA: Diagnosis not present

## 2017-10-28 DIAGNOSIS — M47817 Spondylosis without myelopathy or radiculopathy, lumbosacral region: Secondary | ICD-10-CM | POA: Diagnosis not present

## 2017-11-09 DIAGNOSIS — Z6831 Body mass index (BMI) 31.0-31.9, adult: Secondary | ICD-10-CM | POA: Diagnosis not present

## 2017-11-09 DIAGNOSIS — E039 Hypothyroidism, unspecified: Secondary | ICD-10-CM | POA: Diagnosis not present

## 2017-11-09 DIAGNOSIS — Z299 Encounter for prophylactic measures, unspecified: Secondary | ICD-10-CM | POA: Diagnosis not present

## 2017-11-09 DIAGNOSIS — I10 Essential (primary) hypertension: Secondary | ICD-10-CM | POA: Diagnosis not present

## 2017-11-22 DIAGNOSIS — E038 Other specified hypothyroidism: Secondary | ICD-10-CM | POA: Diagnosis not present

## 2017-11-22 DIAGNOSIS — K200 Eosinophilic esophagitis: Secondary | ICD-10-CM | POA: Diagnosis not present

## 2017-11-22 DIAGNOSIS — I10 Essential (primary) hypertension: Secondary | ICD-10-CM | POA: Diagnosis not present

## 2017-11-22 DIAGNOSIS — I208 Other forms of angina pectoris: Secondary | ICD-10-CM | POA: Diagnosis not present

## 2017-11-23 DIAGNOSIS — I10 Essential (primary) hypertension: Secondary | ICD-10-CM | POA: Diagnosis not present

## 2017-11-23 DIAGNOSIS — M159 Polyosteoarthritis, unspecified: Secondary | ICD-10-CM | POA: Diagnosis not present

## 2017-12-22 DIAGNOSIS — I10 Essential (primary) hypertension: Secondary | ICD-10-CM | POA: Diagnosis not present

## 2017-12-22 DIAGNOSIS — M159 Polyosteoarthritis, unspecified: Secondary | ICD-10-CM | POA: Diagnosis not present

## 2018-01-07 DIAGNOSIS — I10 Essential (primary) hypertension: Secondary | ICD-10-CM | POA: Diagnosis not present

## 2018-01-07 DIAGNOSIS — Z6832 Body mass index (BMI) 32.0-32.9, adult: Secondary | ICD-10-CM | POA: Diagnosis not present

## 2018-01-07 DIAGNOSIS — E039 Hypothyroidism, unspecified: Secondary | ICD-10-CM | POA: Diagnosis not present

## 2018-01-07 DIAGNOSIS — Z299 Encounter for prophylactic measures, unspecified: Secondary | ICD-10-CM | POA: Diagnosis not present

## 2018-01-07 DIAGNOSIS — M549 Dorsalgia, unspecified: Secondary | ICD-10-CM | POA: Diagnosis not present

## 2018-08-12 ENCOUNTER — Other Ambulatory Visit (HOSPITAL_COMMUNITY): Payer: Self-pay | Admitting: Internal Medicine

## 2018-08-12 DIAGNOSIS — N644 Mastodynia: Secondary | ICD-10-CM

## 2018-08-23 ENCOUNTER — Ambulatory Visit (HOSPITAL_COMMUNITY): Payer: Medicare Other

## 2018-08-23 ENCOUNTER — Encounter (HOSPITAL_COMMUNITY): Payer: Self-pay

## 2018-08-23 ENCOUNTER — Other Ambulatory Visit: Payer: Self-pay

## 2018-08-23 ENCOUNTER — Ambulatory Visit (HOSPITAL_COMMUNITY)
Admission: RE | Admit: 2018-08-23 | Discharge: 2018-08-23 | Disposition: A | Discharge status: Home / Self Care | Payer: Medicare Other | Source: Ambulatory Visit | Attending: Internal Medicine | Admitting: Internal Medicine

## 2018-08-23 ENCOUNTER — Ambulatory Visit (HOSPITAL_COMMUNITY): Admission: RE | Admit: 2018-08-23 | Payer: Medicare Other | Source: Ambulatory Visit

## 2018-08-23 DIAGNOSIS — N644 Mastodynia: Secondary | ICD-10-CM

## 2019-02-07 DIAGNOSIS — I10 Essential (primary) hypertension: Secondary | ICD-10-CM | POA: Diagnosis not present

## 2019-03-12 DIAGNOSIS — I10 Essential (primary) hypertension: Secondary | ICD-10-CM | POA: Diagnosis not present

## 2019-03-14 DIAGNOSIS — H524 Presbyopia: Secondary | ICD-10-CM | POA: Diagnosis not present

## 2019-03-14 DIAGNOSIS — Z961 Presence of intraocular lens: Secondary | ICD-10-CM | POA: Diagnosis not present

## 2019-03-14 DIAGNOSIS — H2511 Age-related nuclear cataract, right eye: Secondary | ICD-10-CM | POA: Diagnosis not present

## 2019-03-29 DIAGNOSIS — M159 Polyosteoarthritis, unspecified: Secondary | ICD-10-CM | POA: Diagnosis not present

## 2019-04-11 DIAGNOSIS — I10 Essential (primary) hypertension: Secondary | ICD-10-CM | POA: Diagnosis not present

## 2019-04-21 DIAGNOSIS — M159 Polyosteoarthritis, unspecified: Secondary | ICD-10-CM | POA: Diagnosis not present

## 2019-05-12 DIAGNOSIS — I10 Essential (primary) hypertension: Secondary | ICD-10-CM | POA: Diagnosis not present

## 2019-05-23 DIAGNOSIS — E039 Hypothyroidism, unspecified: Secondary | ICD-10-CM | POA: Diagnosis not present

## 2019-05-23 DIAGNOSIS — M542 Cervicalgia: Secondary | ICD-10-CM | POA: Diagnosis not present

## 2019-05-23 DIAGNOSIS — I10 Essential (primary) hypertension: Secondary | ICD-10-CM | POA: Diagnosis not present

## 2019-05-23 DIAGNOSIS — Z789 Other specified health status: Secondary | ICD-10-CM | POA: Diagnosis not present

## 2019-05-23 DIAGNOSIS — Z299 Encounter for prophylactic measures, unspecified: Secondary | ICD-10-CM | POA: Diagnosis not present

## 2019-08-22 DIAGNOSIS — Z0000 Encounter for general adult medical examination without abnormal findings: Secondary | ICD-10-CM | POA: Diagnosis not present

## 2019-08-22 DIAGNOSIS — Z7189 Other specified counseling: Secondary | ICD-10-CM | POA: Diagnosis not present

## 2019-08-22 DIAGNOSIS — Z299 Encounter for prophylactic measures, unspecified: Secondary | ICD-10-CM | POA: Diagnosis not present

## 2019-08-22 DIAGNOSIS — I10 Essential (primary) hypertension: Secondary | ICD-10-CM | POA: Diagnosis not present

## 2019-08-30 DIAGNOSIS — Z299 Encounter for prophylactic measures, unspecified: Secondary | ICD-10-CM | POA: Diagnosis not present

## 2019-08-30 DIAGNOSIS — Z713 Dietary counseling and surveillance: Secondary | ICD-10-CM | POA: Diagnosis not present

## 2019-08-30 DIAGNOSIS — J069 Acute upper respiratory infection, unspecified: Secondary | ICD-10-CM | POA: Diagnosis not present

## 2019-10-20 DIAGNOSIS — Z299 Encounter for prophylactic measures, unspecified: Secondary | ICD-10-CM | POA: Diagnosis not present

## 2019-10-20 DIAGNOSIS — Z713 Dietary counseling and surveillance: Secondary | ICD-10-CM | POA: Diagnosis not present

## 2019-10-20 DIAGNOSIS — I10 Essential (primary) hypertension: Secondary | ICD-10-CM | POA: Diagnosis not present

## 2019-10-20 DIAGNOSIS — E039 Hypothyroidism, unspecified: Secondary | ICD-10-CM | POA: Diagnosis not present

## 2019-11-09 DIAGNOSIS — Z23 Encounter for immunization: Secondary | ICD-10-CM | POA: Diagnosis not present

## 2019-11-14 DIAGNOSIS — Z299 Encounter for prophylactic measures, unspecified: Secondary | ICD-10-CM | POA: Diagnosis not present

## 2019-11-14 DIAGNOSIS — R109 Unspecified abdominal pain: Secondary | ICD-10-CM | POA: Diagnosis not present

## 2019-11-14 DIAGNOSIS — I10 Essential (primary) hypertension: Secondary | ICD-10-CM | POA: Diagnosis not present

## 2019-11-14 DIAGNOSIS — R350 Frequency of micturition: Secondary | ICD-10-CM | POA: Diagnosis not present

## 2020-05-21 ENCOUNTER — Encounter: Payer: Self-pay | Admitting: Internal Medicine

## 2020-07-30 ENCOUNTER — Encounter: Payer: Self-pay | Admitting: Gastroenterology

## 2020-07-30 NOTE — Progress Notes (Signed)
Referring Provider: Dr. Manuella Ghazi Primary Care Physician:  Monico Blitz, MD Primary Gastroenterologist:  Dr. Gala Romney  Chief Complaint  Patient presents with   Dysphagia    Trouble with everything   Abdominal Pain    Upper abd, both sides, come/goes depending on what she is doing   Nausea    No vomiting   Constipation     HPI:   Connie Kent is a 75 y.o. female presenting today with a history of GERD, dyspepsia, IBS, H. pylori s/p treatment x2-3 without documented eradication presenting today with reports of dysphagia, abdominal pain, nausea, and change in bowel habits.  States 3 weeks ago, she took a bath, and when she was getting out, she developed a severe hunger, abdominal pain, and began having nausea with vomiting.  Since that time, she has continued with persistent nausea though vomiting has resolved.  Also with constant abdominal pain over her entire abdomen, but worse in the mid and upper abdomen.  Currently about 9/10 in severity today.  States she cannot get comfortable at night.  Pain is worsened with laying on her left or right side.  Cannot lay on her back.  Symptoms worsen if she is up moving around or if eating.  States she has eat very small amounts due to worsening pain and nausea.  Seems to be able to drink liquids okay.  She is taking her medications.  Reports reflux has been under good control with omeprazole 40 mg daily.  Also with change in bowel habits since this acute illness.  Used to have 2-3 bowel movements every morning, but now she may have 1 bowel movement in the morning or skip a couple of days.  Intermittent bright red blood per rectum, chronic in the setting of known hemorrhoids using Preparation H as needed which works well.  Denies melena.  Not taking anything for constipation.  Denies hematuria, dysuria.  Urine has been dark over the last week.  Noticed her heart has been racing since she became sick.    Had been taking 1 BC powder in the morning and  evening. Stopped taking 2 days ago. Had been taking for years.   Dysphagia: Intermittent. 2-3 times a week. Can be with pills, foods, and water. Spicy foods cause worse problem. Improved with last dilation. Started back a while ago.   Past Medical History:  Diagnosis Date   GERD (gastroesophageal reflux disease)    Hyperlipidemia    Hypertension    Thyroid disease     Past Surgical History:  Procedure Laterality Date   ABDOMINAL HYSTERECTOMY     breast biopsies     BREAST BIOPSY Bilateral    beghin   CHOLECYSTECTOMY     COLONOSCOPY  06/02/1999   Dr. Gala Romney; internal and external hemorrhoids, poor prep.   COLONOSCOPY  09/08/2002   Dr. Gala Romney; friable internal hemorrhoids, scattered left-sided diverticula, otherwise normal colon and TI.   ESOPHAGOGASTRODUODENOSCOPY  2003   Dr. Gala Romney; normal esophagus s/p empiric dilation, superficial antral erosions.   ESOPHAGOGASTRODUODENOSCOPY  09/08/2002   Small hiatal hernia, otherwise normal.   ESOPHAGOGASTRODUODENOSCOPY  08/22/2007   Dr. Gala Romney; normal esophagus s/p empiric dilation, small hiatal hernia, benign-appearing polypoid antral mucosa, otherwise normal.   EYE SURGERY     HEMORRHOID SURGERY     THROAT SURGERY     Biopsy    Current Outpatient Medications  Medication Sig Dispense Refill   amLODipine (NORVASC) 2.5 MG tablet Take 2.5 mg by mouth daily.  baclofen (LIORESAL) 10 MG tablet Take 10 mg by mouth daily.     FLUoxetine (PROZAC) 20 MG capsule Take 20 mg by mouth daily.     HYDROcodone-acetaminophen (NORCO/VICODIN) 5-325 MG tablet Take 0.5-1 tablets by mouth 2 (two) times daily as needed.     levothyroxine (SYNTHROID, LEVOTHROID) 50 MCG tablet Take 1 tablet (50 mcg total) by mouth daily. 90 tablet 1   meclizine (ANTIVERT) 25 MG tablet Take 25 mg by mouth 3 (three) times daily as needed.     metoprolol tartrate (LOPRESSOR) 25 MG tablet Take by mouth.     Omega-3 Fatty Acids (FISH OIL) 1000 MG CAPS Take 1,000 mg by mouth.      omeprazole (PRILOSEC) 40 MG capsule TAKE 1 CAPSULE EVERY DAY 90 capsule 0   VITAMIN D PO Take by mouth daily.     vitamin E 100 UNIT capsule Take by mouth daily.     No current facility-administered medications for this visit.    Allergies as of 07/31/2020   (No Known Allergies)    Family History  Problem Relation Age of Onset   Cancer Mother    Heart failure Father     Social History   Socioeconomic History   Marital status: Married    Spouse name: Not on file   Number of children: Not on file   Years of education: Not on file   Highest education level: Not on file  Occupational History   Not on file  Tobacco Use   Smoking status: Former    Types: Cigarettes   Smokeless tobacco: Never  Substance and Sexual Activity   Alcohol use: No   Drug use: No   Sexual activity: Not Currently  Other Topics Concern   Not on file  Social History Narrative   Not on file   Social Determinants of Health   Financial Resource Strain: Not on file  Food Insecurity: Not on file  Transportation Needs: Not on file  Physical Activity: Not on file  Stress: Not on file  Social Connections: Not on file  Intimate Partner Violence: Not on file    Review of Systems: Gen: Denies any fever, chills, cold or flu like symptoms.  CV: Denies chest pain Resp: SOB with nausea. No cough.  GI: See HPI GU : See HPI Derm: Denies rash Psych: Denies depression, anxiety Heme: See HPI  Physical Exam: BP (!) 157/86   Pulse (!) 122   Temp 97.7 F (36.5 C)   Ht 5' 1.5" (1.562 m)   Wt 171 lb (77.6 kg)   BMI 31.79 kg/m  General:   Alert and oriented. Pleasant and cooperative. Well-nourished and well-developed. No acute distress.  Head:  Normocephalic and atraumatic. Eyes:  Without icterus, sclera clear and conjunctiva pink.  Ears:  Normal auditory acuity. Lungs:  Clear to auscultation bilaterally. No wheezes, rales, or rhonchi. No distress.  Heart:  S1, S2 present without murmurs appreciated.  Tachycardic. Regular rhythm.  Abdomen:  +BS, soft, non-distended. Moderate to severe generalized tenderness to palpation, greatest in the epigastric and LUQ region. No HSM noted. No guarding or rebound. No masses appreciated.  Rectal:  Deferred  Msk:  Symmetrical without gross deformities. Normal posture. Extremities:  Without edema. Neurologic:  Alert and  oriented x4;  grossly normal neurologically. Skin:  Intact without significant lesions or rashes. Psych:  Normal mood and affect.    Assessment: 75 year old female with history of GERD, dyspepsia, IBS, H. pylori s/p treatment x2-3 without documented eradication presenting  today reporting dysphagia, abdominal pain, nausea, and change in bowel habits.  Mid to upper abdominal pain, nausea with vomiting started acutely about 3 weeks ago with no obvious precipitating event.  Since that time, vomiting has resolved, but nausea and abdominal pain persist and are constant at this point.  Currently, abdominal pain is 9/10 in severity, unable to get comfortable at night, worse when up moving around or when eating.  Food intake has been minimal.  Reports she is able to drink liquids though she has noticed her urine is darker this week.  Notably, her heart rate was 122 during my exam today. Reflux has remained well controlled on omeprazole 40 mg daily.  She does report taking BC powders twice daily for years and just stopped 2 days ago. Associated with this acute illness, she has had decreased frequency and bowel movements.  Chronic intermittent bright red blood per rectum without change in the setting of known hemorrhoids.  Denies melena. On exam, she has moderate to severe generalized tenderness to palpation, greatest in the epigastric and LUQ region. History of cholecystectomy.   Differentials include esophagitis, gastritis, duodenitis, persistent H. pylori, PUD, pancreatitis, partial small bowel obstruction. Less likely perforated ulcer as she doesn't have  guarding or rebound, but can't exclude. With HR 122 on exam, reports of dark urine, I am concerned about dehydration. Recommended ED evaluation as I suspect she will at the least need some IV fluids. Would recommend CT A/P with contrast, CBC, CMP, Lipase. I have called ED charge nurse and notified them.   Dysphagia: Dysphagia to solids and liquids.  Symptoms previously improved with empiric esophageal dilation in 2009 for quite some time.  Query whether she has had an occult esophageal web vs development of stricture.  Ultimately, she will need repeat EGD.  However, she is going to the emergency room today for further evaluation of her abdominal pain and nausea as per above.  We will regroup after ED visit.  Plan: 1.  Proceed to [facility] emergency department for further evaluation of abdominal pain, nausea, tachycardia, suspected dehydration. 2.  Patient will need EGD with possible esophageal dilation to further evaluate dysphagia and possibly to evaluate upper abdominal pain pending ED evaluation.  3.  We will regroup after ED visit.     Aliene Altes, PA-C [facility] 07/31/2020

## 2020-07-31 ENCOUNTER — Encounter: Payer: Self-pay | Admitting: Gastroenterology

## 2020-07-31 ENCOUNTER — Other Ambulatory Visit: Payer: Self-pay

## 2020-07-31 ENCOUNTER — Emergency Department (HOSPITAL_COMMUNITY): Payer: Medicare Other

## 2020-07-31 ENCOUNTER — Encounter (HOSPITAL_COMMUNITY): Payer: Self-pay | Admitting: *Deleted

## 2020-07-31 ENCOUNTER — Ambulatory Visit (INDEPENDENT_AMBULATORY_CARE_PROVIDER_SITE_OTHER): Payer: Medicare Other | Admitting: Gastroenterology

## 2020-07-31 ENCOUNTER — Emergency Department (HOSPITAL_COMMUNITY)
Admission: EM | Admit: 2020-07-31 | Discharge: 2020-07-31 | Disposition: A | Discharge status: Home / Self Care | Payer: Medicare Other | Attending: Emergency Medicine | Admitting: Emergency Medicine

## 2020-07-31 VITALS — BP 157/86 | HR 122 | Temp 97.7°F | Ht 61.5 in | Wt 171.0 lb

## 2020-07-31 DIAGNOSIS — N281 Cyst of kidney, acquired: Secondary | ICD-10-CM | POA: Diagnosis not present

## 2020-07-31 DIAGNOSIS — R000 Tachycardia, unspecified: Secondary | ICD-10-CM | POA: Insufficient documentation

## 2020-07-31 DIAGNOSIS — R194 Change in bowel habit: Secondary | ICD-10-CM

## 2020-07-31 DIAGNOSIS — E039 Hypothyroidism, unspecified: Secondary | ICD-10-CM | POA: Insufficient documentation

## 2020-07-31 DIAGNOSIS — Z87891 Personal history of nicotine dependence: Secondary | ICD-10-CM | POA: Insufficient documentation

## 2020-07-31 DIAGNOSIS — R110 Nausea: Secondary | ICD-10-CM

## 2020-07-31 DIAGNOSIS — R1084 Generalized abdominal pain: Secondary | ICD-10-CM

## 2020-07-31 DIAGNOSIS — Z79899 Other long term (current) drug therapy: Secondary | ICD-10-CM | POA: Diagnosis not present

## 2020-07-31 DIAGNOSIS — R109 Unspecified abdominal pain: Secondary | ICD-10-CM | POA: Diagnosis present

## 2020-07-31 DIAGNOSIS — I10 Essential (primary) hypertension: Secondary | ICD-10-CM | POA: Diagnosis not present

## 2020-07-31 LAB — URINALYSIS, ROUTINE W REFLEX MICROSCOPIC
Bilirubin Urine: NEGATIVE
Glucose, UA: NEGATIVE mg/dL
Hgb urine dipstick: NEGATIVE
Ketones, ur: NEGATIVE mg/dL
Leukocytes,Ua: NEGATIVE
Nitrite: NEGATIVE
Protein, ur: NEGATIVE mg/dL
Specific Gravity, Urine: 1.017 (ref 1.005–1.030)
pH: 6 (ref 5.0–8.0)

## 2020-07-31 LAB — CBC WITH DIFFERENTIAL/PLATELET
Abs Immature Granulocytes: 0.03 10*3/uL (ref 0.00–0.07)
Basophils Absolute: 0 10*3/uL (ref 0.0–0.1)
Basophils Relative: 0 %
Eosinophils Absolute: 0 10*3/uL (ref 0.0–0.5)
Eosinophils Relative: 0 %
HCT: 41.2 % (ref 36.0–46.0)
Hemoglobin: 13.5 g/dL (ref 12.0–15.0)
Immature Granulocytes: 0 %
Lymphocytes Relative: 18 %
Lymphs Abs: 1.5 10*3/uL (ref 0.7–4.0)
MCH: 31.1 pg (ref 26.0–34.0)
MCHC: 32.8 g/dL (ref 30.0–36.0)
MCV: 94.9 fL (ref 80.0–100.0)
Monocytes Absolute: 0.5 10*3/uL (ref 0.1–1.0)
Monocytes Relative: 7 %
Neutro Abs: 6.1 10*3/uL (ref 1.7–7.7)
Neutrophils Relative %: 75 %
Platelets: 271 10*3/uL (ref 150–400)
RBC: 4.34 MIL/uL (ref 3.87–5.11)
RDW: 13.4 % (ref 11.5–15.5)
WBC: 8.1 10*3/uL (ref 4.0–10.5)
nRBC: 0 % (ref 0.0–0.2)

## 2020-07-31 LAB — COMPREHENSIVE METABOLIC PANEL
ALT: 69 U/L — ABNORMAL HIGH (ref 0–44)
AST: 74 U/L — ABNORMAL HIGH (ref 15–41)
Albumin: 4.2 g/dL (ref 3.5–5.0)
Alkaline Phosphatase: 89 U/L (ref 38–126)
Anion gap: 10 (ref 5–15)
BUN: 25 mg/dL — ABNORMAL HIGH (ref 8–23)
CO2: 25 mmol/L (ref 22–32)
Calcium: 9.2 mg/dL (ref 8.9–10.3)
Chloride: 100 mmol/L (ref 98–111)
Creatinine, Ser: 0.85 mg/dL (ref 0.44–1.00)
GFR, Estimated: >60 mL/min (ref >60)
Glucose, Bld: 151 mg/dL — ABNORMAL HIGH (ref 70–99)
Potassium: 3.8 mmol/L (ref 3.5–5.1)
Sodium: 135 mmol/L (ref 135–145)
Total Bilirubin: 0.5 mg/dL (ref 0.3–1.2)
Total Protein: 7.6 g/dL (ref 6.5–8.1)

## 2020-07-31 LAB — LIPASE, BLOOD: Lipase: 29 U/L (ref 11–51)

## 2020-07-31 MED ORDER — ONDANSETRON HCL 4 MG/2ML IJ SOLN
4.0000 mg | Freq: Once | INTRAMUSCULAR | Status: AC
  Administered 2020-07-31: 4 mg via INTRAVENOUS
  Filled 2020-07-31: qty 2

## 2020-07-31 MED ORDER — IOHEXOL 300 MG/ML  SOLN
100.0000 mL | Freq: Once | INTRAMUSCULAR | Status: AC | PRN
  Administered 2020-07-31: 100 mL via INTRAVENOUS

## 2020-07-31 MED ORDER — SODIUM CHLORIDE 0.9 % IV BOLUS
1000.0000 mL | Freq: Once | INTRAVENOUS | Status: AC
  Administered 2020-07-31: 1000 mL via INTRAVENOUS

## 2020-07-31 MED ORDER — ONDANSETRON HCL 4 MG PO TABS: 4.0000 mg | ORAL_TABLET | Freq: Three times a day (TID) | ORAL | 0 refills | Status: DC | PRN

## 2020-07-31 NOTE — ED Triage Notes (Signed)
Pt states she was at Dr. Coralee North office and they sent her here stating she was tachycardic and dehydrated

## 2020-07-31 NOTE — ED Provider Notes (Signed)
[facility] EMERGENCY DEPARTMENT Provider Note   CSN: [id number] Arrival date & time: 07/31/20  1559     History Chief Complaint  Patient presents with   Tachycardia    Connie Kent is a 75 y.o. female.   Pt presents to the ED today with an elevated HR and abd pain from Dr. Roseanne Kaufman office.  Pt said she started feeling ill about 3 weeks ago.  She had some vomiting, but has continued nausea and abd pain.  She said she has been drinking and eating normally.  She was sent here for further eval.          Past Medical History:  Diagnosis Date   GERD (gastroesophageal reflux disease)    Hyperlipidemia    Hypertension    Thyroid disease     Patient Active Problem List   Diagnosis Date Noted   Generalized abdominal pain 07/31/2020   Nausea without vomiting 07/31/2020   Change in bowel habits 07/31/2020   Tachycardia 07/31/2020   Exertional dyspnea 09/04/2014   Carpal tunnel syndrome 09/04/2014   Depression 09/08/2013   Chest pain, precordial 03/24/2012   INSECT BITE 07/13/2008   Lumbar back pain 07/05/2008   CLOSED FRACTURE OF LATERAL MALLEOLUS 02/08/2008   HYPOTHYROIDISM 09/30/2007   TOBACCO ABUSE 07/22/2007   HIATAL HERNIA 07/22/2007   [place] DISEASE, LUMBOSACRAL SPINE 07/22/2007   RISK OF SLEEP APNEA 07/22/2007   HYPERLIPIDEMIA 02/25/2006   ANXIETY 12/10/2005   Essential hypertension 12/10/2005   GERD 12/10/2005   CONSTIPATION 12/10/2005   ARTHRITIS 12/10/2005    Past Surgical History:  Procedure Laterality Date   ABDOMINAL HYSTERECTOMY     breast biopsies     BREAST BIOPSY Bilateral    beghin   CHOLECYSTECTOMY     COLONOSCOPY  06/02/1999   Dr. Gala Romney; internal and external hemorrhoids, poor prep.   COLONOSCOPY  09/08/2002   Dr. Gala Romney; friable internal hemorrhoids, scattered left-sided diverticula, otherwise normal colon and TI.   ESOPHAGOGASTRODUODENOSCOPY  2003   Dr. Gala Romney; normal esophagus s/p empiric dilation, superficial antral erosions.    ESOPHAGOGASTRODUODENOSCOPY  09/08/2002   Small hiatal hernia, otherwise normal.   ESOPHAGOGASTRODUODENOSCOPY  08/22/2007   Dr. Gala Romney; normal esophagus s/p empiric dilation, small hiatal hernia, benign-appearing polypoid antral mucosa, otherwise normal.   EYE SURGERY     HEMORRHOID SURGERY     THROAT SURGERY     Biopsy     OB History     Gravida  5   Para  4   Term  4   Preterm      AB      Living  4      SAB      IAB      Ectopic      Multiple      Live Births              Family History  Problem Relation Age of Onset   Cancer Mother    Heart failure Father     Social History   Tobacco Use   Smoking status: Former    Types: Cigarettes   Smokeless tobacco: Never  Substance Use Topics   Alcohol use: No   Drug use: No    Home Medications Prior to Admission medications   Medication Sig Start Date End Date Taking? Authorizing Provider  amLODipine (NORVASC) 2.5 MG tablet Take 2.5 mg by mouth daily. 07/06/20  Yes [provider]  baclofen (LIORESAL) 10 MG tablet Take 10 mg by mouth  daily. 05/04/20  Yes [provider]  FLUoxetine (PROZAC) 20 MG capsule Take 20 mg by mouth daily. 05/04/20  Yes [provider]  HYDROcodone-acetaminophen (NORCO/VICODIN) 5-325 MG tablet Take 0.5-1 tablets by mouth 2 (two) times daily as needed. 04/19/20  Yes [provider]  levothyroxine (SYNTHROID, LEVOTHROID) 50 MCG tablet Take 1 tablet (50 mcg total) by mouth daily. 01/31/15  Yes Dettinger, Fransisca Kaufmann, MD  meclizine (ANTIVERT) 25 MG tablet Take 25 mg by mouth 3 (three) times daily as needed. 05/04/20  Yes [provider]  metoprolol tartrate (LOPRESSOR) 25 MG tablet Take 25 mg by mouth 2 (two) times daily. 06/04/16  Yes [provider]  Omega-3 Fatty Acids (FISH OIL) 1000 MG CAPS Take 1,000 mg by mouth.   Yes [provider]  omeprazole (PRILOSEC) 40 MG capsule TAKE 1 CAPSULE EVERY DAY 10/07/15  Yes Dettinger, Fransisca Kaufmann,  MD  ondansetron (ZOFRAN) 4 MG tablet Take 1 tablet (4 mg total) by mouth every 8 (eight) hours as needed for nausea or vomiting. 07/31/20  Yes Isla Pence, MD  VITAMIN D PO Take by mouth daily.   Yes [provider]  vitamin E 100 UNIT capsule Take by mouth daily.   Yes [provider]    Allergies    Patient has no known allergies.  Review of Systems   Review of Systems  Gastrointestinal:  Positive for abdominal pain and nausea.  All other systems reviewed and are negative.  Physical Exam Updated Vital Signs BP 134/71   Pulse (!) 109   Temp 98 F (36.7 C) (Oral)   Resp 20   Ht 5' 1.5" (1.562 m)   Wt 77.6 kg   SpO2 95%   BMI 31.79 kg/m   Physical Exam Vitals and nursing note reviewed.  Constitutional:      Appearance: Normal appearance. She is obese.  HENT:     Head: Normocephalic and atraumatic.     Right Ear: External ear normal.     Left Ear: External ear normal.     Nose: Nose normal.     Mouth/Throat:     Mouth: Mucous membranes are dry.  Eyes:     Extraocular Movements: Extraocular movements intact.     Conjunctiva/sclera: Conjunctivae normal.     Pupils: Pupils are equal, round, and reactive to light.  Cardiovascular:     Rate and Rhythm: Regular rhythm. Tachycardia present.     Pulses: Normal pulses.     Heart sounds: Normal heart sounds.  Pulmonary:     Effort: Pulmonary effort is normal.     Breath sounds: Normal breath sounds.  Abdominal:     General: Abdomen is flat. Bowel sounds are normal.     Palpations: Abdomen is soft.     Tenderness: There is generalized abdominal tenderness.  Musculoskeletal:        General: Normal range of motion.     Cervical back: Normal range of motion and neck supple.  Skin:    General: Skin is warm.     Capillary Refill: Capillary refill takes less than 2 seconds.  Neurological:     General: No focal deficit present.     Mental Status: She is alert and oriented to person, place, and time.   Psychiatric:        Mood and Affect: Mood normal.        Behavior: Behavior normal.        Thought Content: Thought content normal.  Judgment: Judgment normal.    ED Results / Procedures / Treatments   Labs (all labs ordered are listed, but only abnormal results are displayed) Labs Reviewed  COMPREHENSIVE METABOLIC PANEL - Abnormal; Notable for the following components:      Result Value   Glucose, Bld 151 (*)    BUN 25 (*)    AST 74 (*)    ALT 69 (*)    All other components within normal limits  CBC WITH DIFFERENTIAL/PLATELET  LIPASE, BLOOD  URINALYSIS, ROUTINE W REFLEX MICROSCOPIC    EKG EKG Interpretation  Date/Time:  Wednesday July 31 2020 16:12:07 EDT Ventricular Rate:  123 PR Interval:  154 QRS Duration: 94 QT Interval:  330 QTC Calculation: 472 R Axis:   45 Text Interpretation: Sinus tachycardia Cannot rule out Anterior infarct , age undetermined Abnormal ECG Since last tracing rate faster Confirmed by Isla Pence [phone]) on 07/31/2020 5:32:09 PM  Radiology CT Abdomen Pelvis W Contrast  Result Date: 07/31/2020 CLINICAL DATA:  Abdominal pain, lower abdominal pain with nausea EXAM: CT ABDOMEN AND PELVIS WITH CONTRAST TECHNIQUE: Multidetector CT imaging of the abdomen and pelvis was performed using the standard protocol following bolus administration of intravenous contrast. CONTRAST:  146mL OMNIPAQUE IOHEXOL 300 MG/ML  SOLN COMPARISON:  None. FINDINGS: Lower chest: Lung bases are clear. Hepatobiliary: No focal hepatic lesion. Postcholecystectomy. No biliary dilatation. Pancreas: Pancreas is normal. No ductal dilatation. No pancreatic inflammation. Spleen: Normal spleen Adrenals/urinary tract: Adrenal glands normal. Extrarenal pelvis on the LEFT. No hydroureter or ureterolithiasis. Partially exophytic from the superior lateral aspect of the LEFT kidney is a 10 mm enhancing lesion (image 23/2). Additional bilateral hypodensities appear to represent benign cysts.  Several small cystic lesions are too small to characterize. Stomach/Bowel: Stomach, small-bowel and cecum are normal. The appendix is not identified but there is no pericecal inflammation to suggest appendicitis. The colon and rectosigmoid colon are normal. Vascular/Lymphatic: Abdominal aorta is normal caliber with atherosclerotic calcification. There is no retroperitoneal or periportal lymphadenopathy. No pelvic lymphadenopathy. Reproductive: Post hysterectomy.  Adnexa unremarkable Other: No free fluid. Musculoskeletal: No aggressive osseous lesion. IMPRESSION: 1. No acute findings in the abdomen pelvis. No explanation for nausea or abdominal pain. 2. Incidental finding of a small enhancing LEFT renal lesion. Recommend non emergent urology consultation for management of probable renal neoplasm (benign or malignant). Electronically Signed   By: Suzy Bouchard M.D.   On: 07/31/2020 18:50    Procedures Procedures   Medications Ordered in ED Medications  ondansetron (ZOFRAN) injection 4 mg (4 mg Intravenous Given 07/31/20 1707)  sodium chloride 0.9 % bolus 1,000 mL (0 mLs Intravenous Stopped 07/31/20 1842)  iohexol (OMNIPAQUE) 300 MG/ML solution 100 mL (100 mLs Intravenous Contrast Given 07/31/20 1825)  sodium chloride 0.9 % bolus 1,000 mL (1,000 mLs Intravenous New Bag/Given 07/31/20 1856)    ED Course  I have reviewed the triage vital signs and the nursing notes.  Pertinent labs & imaging results that were available during my care of the patient were reviewed by me and considered in my medical decision making (see chart for details).    MDM Rules/Calculators/A&P                           Pt given IVFs.  Labs show mild LFT elevations.  CT showed nothing acute other than a kidney lesion.  The pt is told of this and the need to f/u with urology.    Pt's HR is  still elevated.  She does not want any further fluids or admission.  She wants to go home and eat.  She knows to return if worse.  Final  Clinical Impression(s) / ED Diagnoses Final diagnoses:  Tachycardia  Generalized abdominal pain  Renal cyst    Rx / DC Orders ED Discharge Orders          Ordered    ondansetron (ZOFRAN) 4 MG tablet  Every 8 hours PRN        07/31/20 Randol Kern             Isla Pence, MD 07/31/20 1904

## 2020-07-31 NOTE — Patient Instructions (Signed)
Please proceed to [facility] for further evaluation.   I will keep an eye on your chart while you are there.   Aliene Altes, PA-C [facility]

## 2020-07-31 NOTE — ED Notes (Signed)
Pt sent here by PMD with concern for dehydration and elevated heart rate. Pt reports having dry heaves about 3 weeks ago and intermittent lower abdominal pain, denies pain currently. Pt reports frequent hydration with water, etc

## 2020-08-01 ENCOUNTER — Telehealth: Payer: Self-pay | Admitting: Gastroenterology

## 2020-08-01 DIAGNOSIS — N289 Disorder of kidney and ureter, unspecified: Secondary | ICD-10-CM

## 2020-08-01 DIAGNOSIS — R1010 Upper abdominal pain, unspecified: Secondary | ICD-10-CM

## 2020-08-01 DIAGNOSIS — R110 Nausea: Secondary | ICD-10-CM

## 2020-08-01 DIAGNOSIS — R1310 Dysphagia, unspecified: Secondary | ICD-10-CM | POA: Insufficient documentation

## 2020-08-01 DIAGNOSIS — K219 Gastro-esophageal reflux disease without esophagitis: Secondary | ICD-10-CM

## 2020-08-01 MED ORDER — OMEPRAZOLE 40 MG PO CPDR: 40.0000 mg | DELAYED_RELEASE_CAPSULE | Freq: Two times a day (BID) | ORAL | 3 refills | Status: DC

## 2020-08-01 NOTE — Telephone Encounter (Signed)
Called pt, informed her of Kristen's recommendation. She is ok with waiting until 09/20/20 to see urology. Informed her we will call back to schedule procedure when Dr. Roseanne Kaufman September schedule is available.

## 2020-08-01 NOTE — Telephone Encounter (Signed)
ASAP referral sent to urology via Epic. Will call pt to schedule procedure when Dr. Roseanne Kaufman future schedule is available.

## 2020-08-01 NOTE — Addendum Note (Signed)
Addended by: Hassan Rowan on: 08/01/2020 08:11 AM   Modules accepted: Orders

## 2020-08-01 NOTE — Telephone Encounter (Signed)
Received call from urology, they're not able to see pt until September. Appt scheduled for 09/20/20.  FYI to [employer] PA.

## 2020-08-01 NOTE — Telephone Encounter (Signed)
Reviewed emergency room visit.  CT A/P with contrast with no acute findings.  She did have a 10 mm enhancing lesion of the left kidney with recommendations for urology consultation for further evaluation.  CBC was normal.  Lipase normal.  CMP with mildly elevated LFTs which she has also had in the past.  Liver looked okay on CT.  This will need to be addressed at a follow-up visit.  She was given IV fluids and was prescribed Zofran.  Notably, her heart rate did improve after IV fluids were given, down to around 101 bpm.   I spoke with patient this morning.  She is feeling somewhat better since receiving IV fluids yesterday.  Abdominal pain is not as bad and nausea is not as severe.  She is going to pick up Zofran this morning.  I recommended increasing omeprazole to 40 mg twice daily 30 minutes before breakfast and dinner.  Use Zofran as needed and let us know if she needs a refill.  Recommended proceeding with EGD with possible dilation for further evaluation of abdominal pain, nausea, and dysphagia.  Avoid all NSAIDs.  Also recommended using MiraLAX 17 g daily in 8 ounces of water for constipation.  She is agreeable to proceeding with EGD and voiced understanding on recommendations.  She would also like a referral to urology.  I advised she call back with any further questions or concerns.   [facility] clinical pool: Please arrange EGD +/-dilation with propofol with Dr. Gala Romney.  Dx: Upper abdominal pain, nausea, dysphagia. ASA II  Please also place ASAP referral to urology.  Dx: 10 mm enhancing lesion of left kidney.

## 2020-08-01 NOTE — Telephone Encounter (Signed)
Noted. She can discuss with PCP if they would like to refer her somewhere else to be seen sooner.

## 2020-08-01 NOTE — Telephone Encounter (Signed)
Tried to call pt, no answer 

## 2020-08-05 IMAGING — MG DIGITAL DIAGNOSTIC BILATERAL MAMMOGRAM WITH TOMO AND CAD
6 of 10 series · 6 of 30 positions shown · non-contrast
Comparison: Previous exam(s).

CLINICAL DATA: Patient presents complaining of diffuse right breast
pain. No reported lumps.

EXAM:
DIGITAL DIAGNOSTIC BILATERAL MAMMOGRAM WITH CAD AND TOMO

[L CC synth-2D]
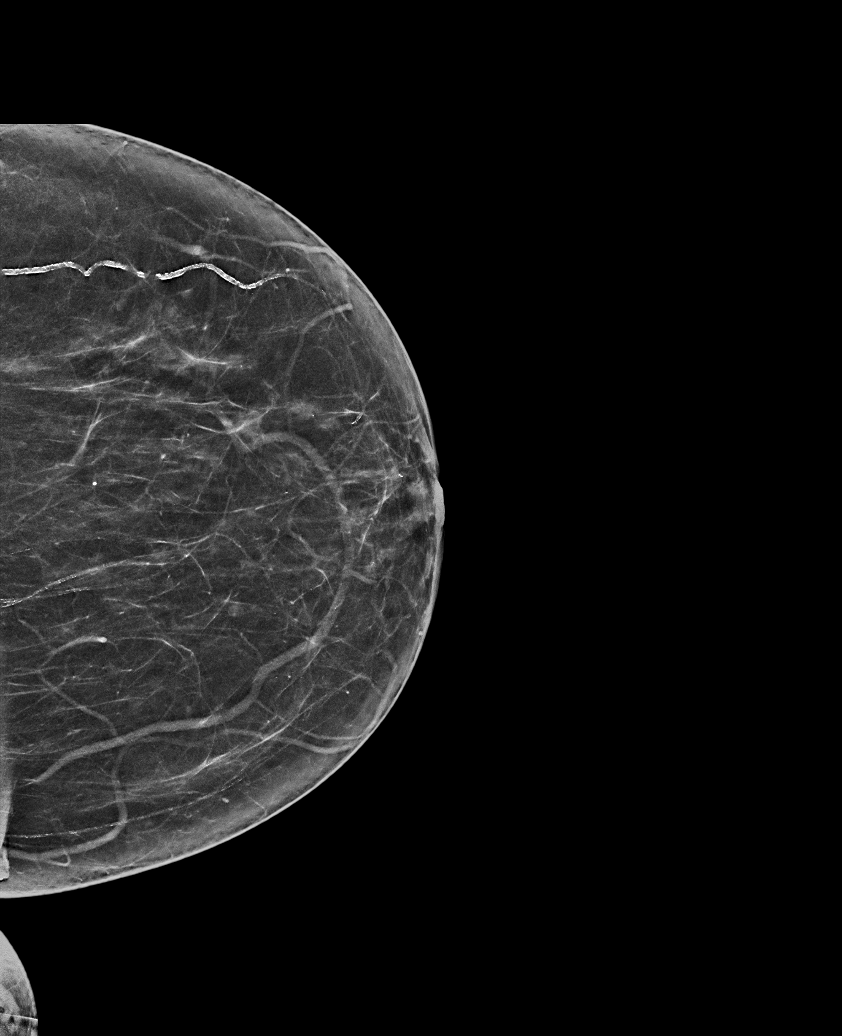

[R CC synth-2D]
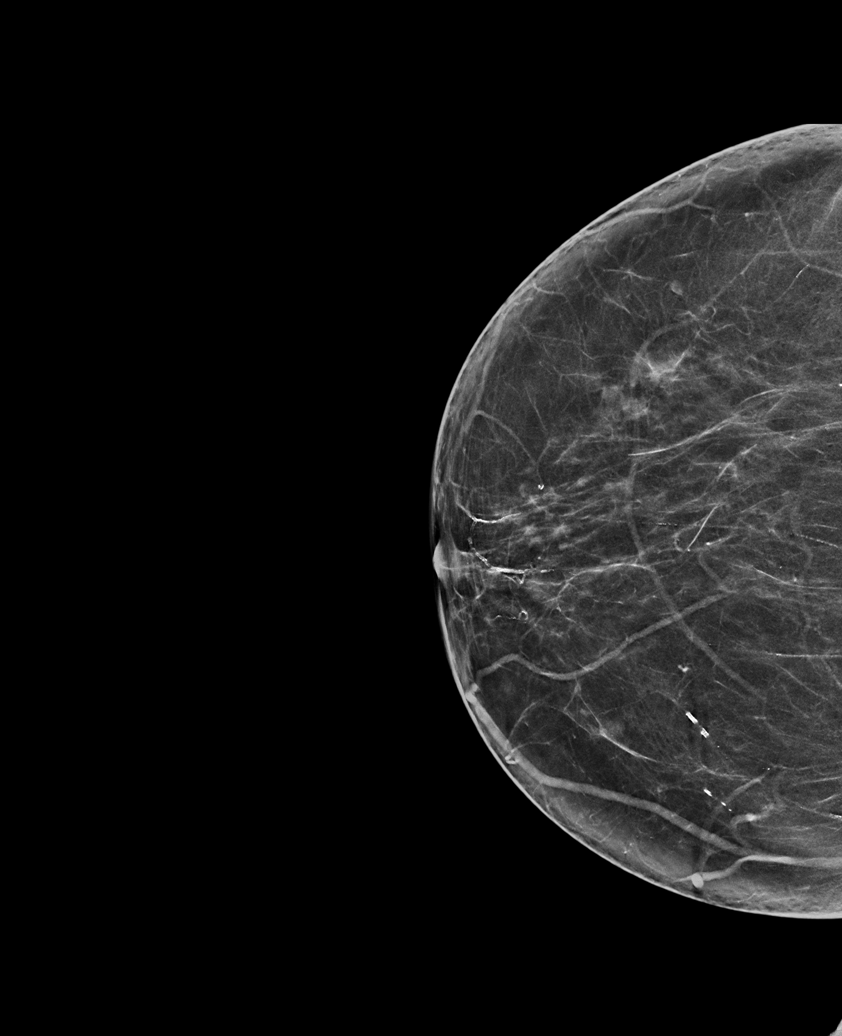

[R MLO synth-2D (1 of 2)]
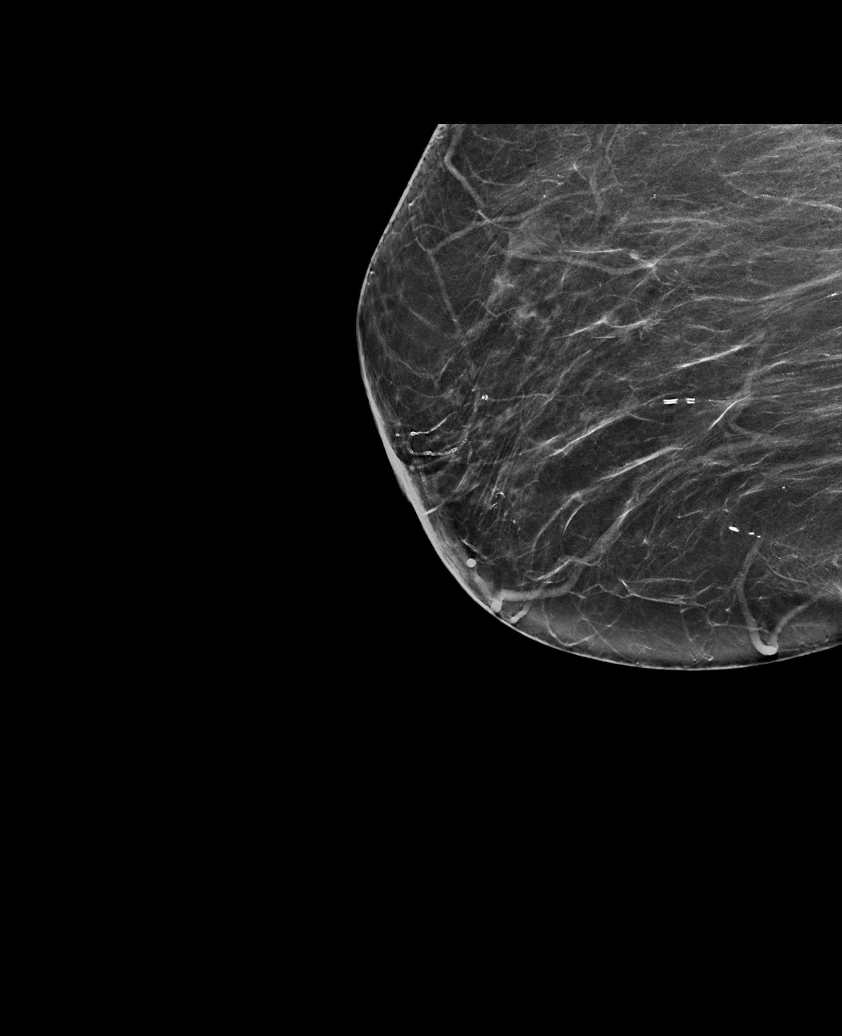

[R MLO synth-2D (2 of 2)]
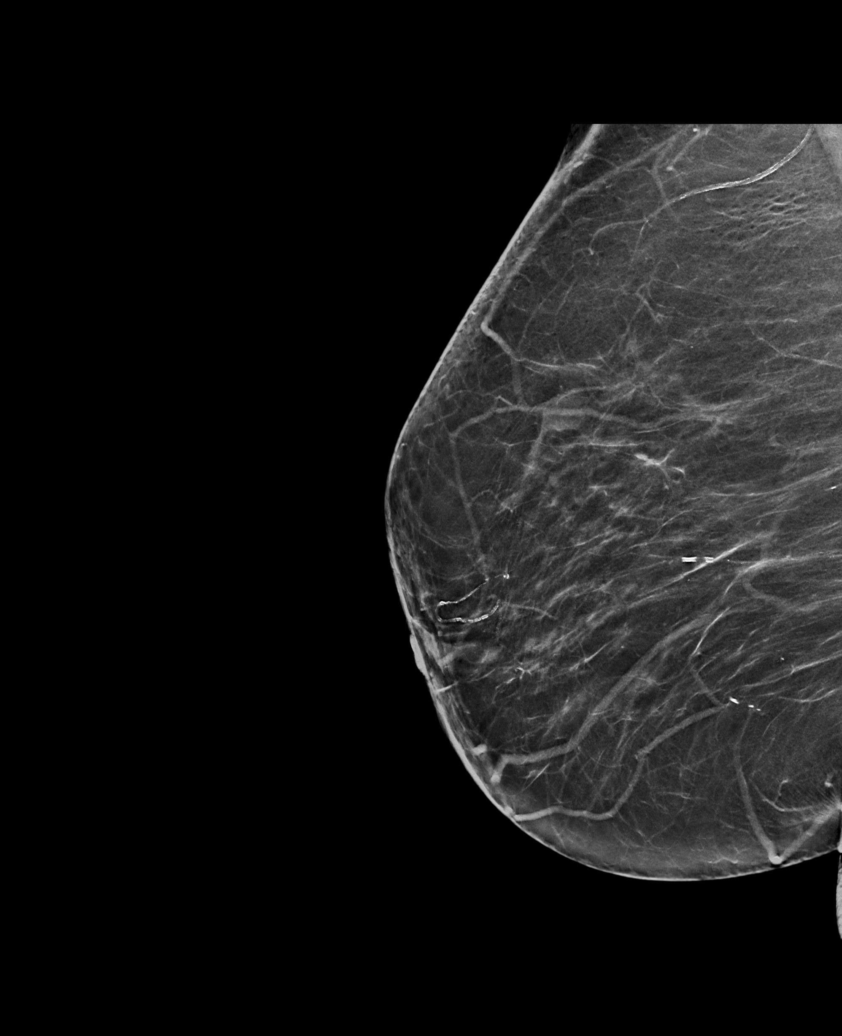

[L MLO synth-2D]
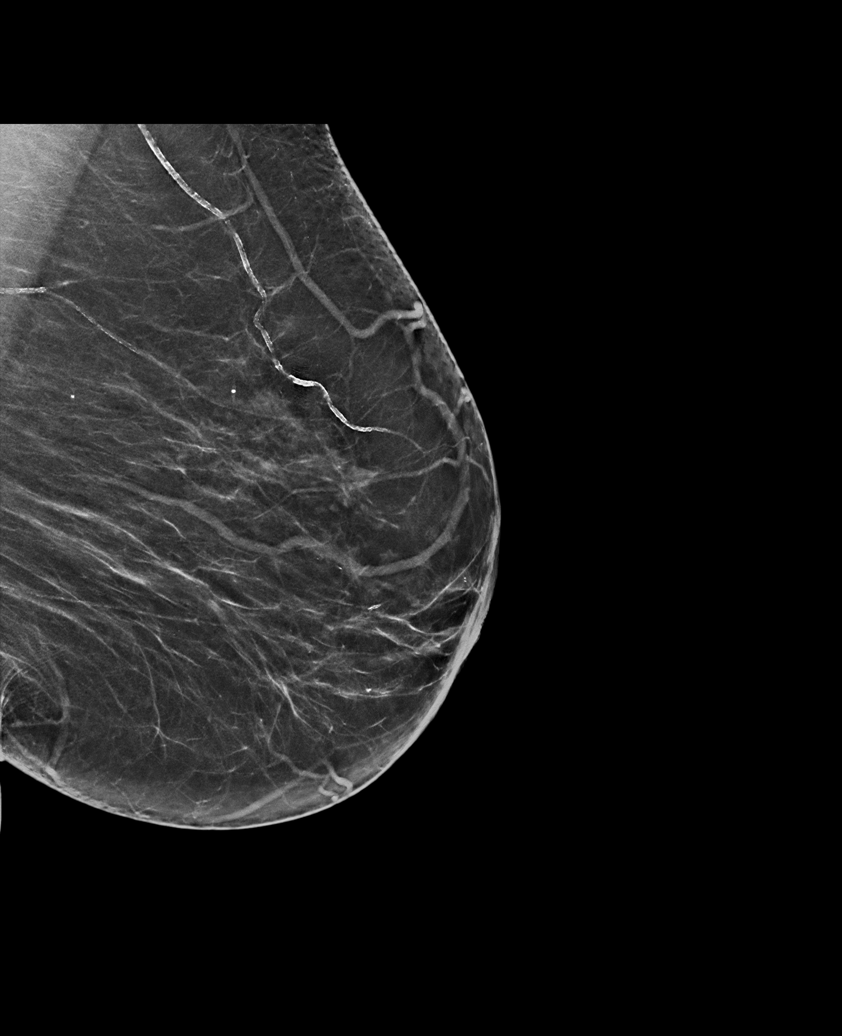

[R MLO tomo · tomo slice 33/66.0]
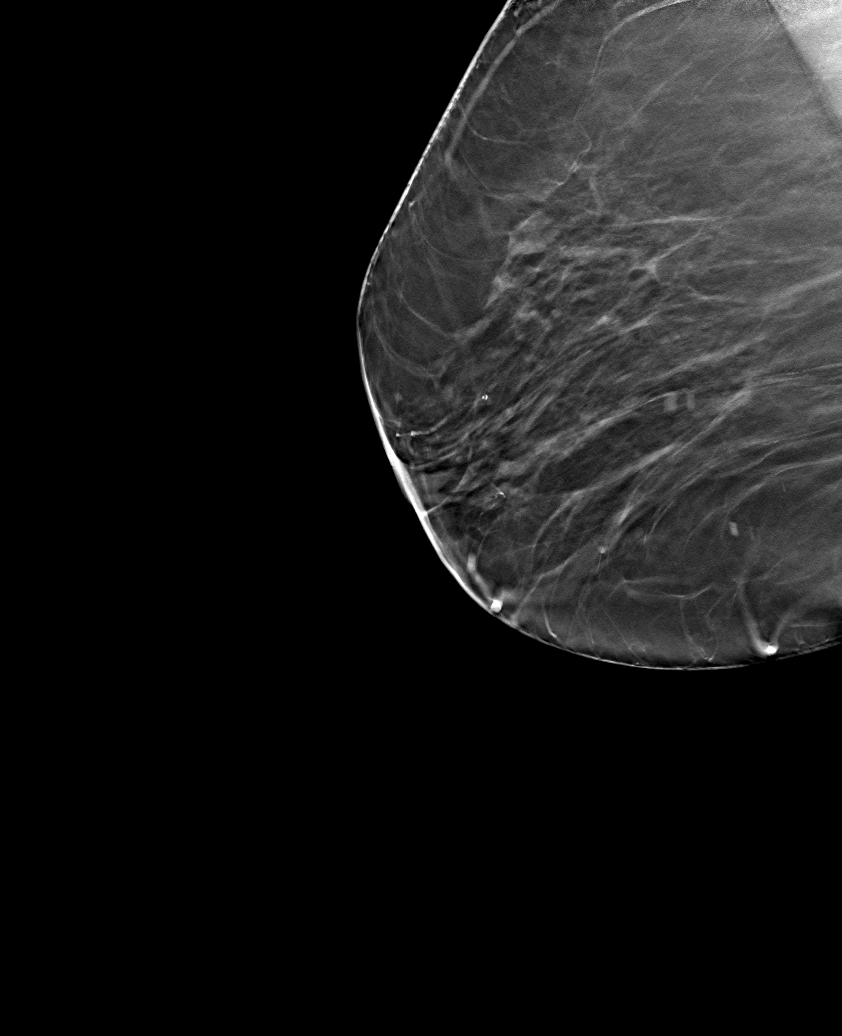

[6 of 30 positions shown; findings below may reference images not displayed]

ACR Breast Density Category b: There are scattered areas of
fibroglandular density.
FINDINGS: There are no new or suspicious masses, no areas of architectural
distortion, no areas of significant asymmetry and no new or
suspicious calcifications. No mammographic change.

Mammographic images were processed with CAD.
IMPRESSION: No evidence of breast malignancy.

RECOMMENDATION:
Screening mammogram in one year.(Code:D7-2-KOK)

I have discussed the findings and recommendations with the patient.
Results were also provided in writing at the conclusion of the
visit. If applicable, a reminder letter will be sent to the patient
regarding the next appointment.

BI-RADS CATEGORY  2: Benign.

## 2020-08-08 NOTE — Telephone Encounter (Signed)
Called pt, EGD/-/+DIL scheduled for 09/23/20 at 7:30am. Orders entered. Procedure instructions mailed.

## 2020-09-20 ENCOUNTER — Ambulatory Visit: Payer: Medicare Other | Admitting: Urology

## 2020-09-20 ENCOUNTER — Other Ambulatory Visit: Payer: Self-pay

## 2020-09-20 ENCOUNTER — Encounter: Payer: Self-pay | Admitting: Urology

## 2020-09-20 VITALS — BP 138/72 | HR 81

## 2020-09-20 DIAGNOSIS — N3281 Overactive bladder: Secondary | ICD-10-CM

## 2020-09-20 DIAGNOSIS — N289 Disorder of kidney and ureter, unspecified: Secondary | ICD-10-CM

## 2020-09-20 LAB — URINALYSIS, ROUTINE W REFLEX MICROSCOPIC
Bilirubin, UA: NEGATIVE
Glucose, UA: NEGATIVE
Ketones, UA: NEGATIVE
Leukocytes,UA: NEGATIVE
Nitrite, UA: NEGATIVE
Protein,UA: NEGATIVE
RBC, UA: NEGATIVE
Specific Gravity, UA: 1.025 (ref 1.005–1.030)
Urobilinogen, Ur: 0.2 mg/dL (ref 0.2–1.0)
pH, UA: 6 (ref 5.0–7.5)

## 2020-09-20 MED ORDER — MIRABEGRON ER 25 MG PO TB24: 25.0000 mg | ORAL_TABLET | Freq: Every day | ORAL | 0 refills | Status: DC

## 2020-09-20 NOTE — Progress Notes (Signed)
09/20/2020 11:26 AM   Connie Kent [birth year] [id number]  Referring provider: Erenest Rasher, PA-C [address] [place],  [place] [address]  Left renal mass   HPI: Connie Kent is a 75yo here for evaluation of a left renal mass. She 07/31/2020 for abdominal pain and was found to have a 1cm left mid pole exophytic renal mass. She denies any flank pain. No hematuria or dysuria. She has worsening urgency and urge incontinence for the past 3-4 years. She has associated nocturia 5-6x. She is bothered by her nocturia. NO fluid consumption with 2 hours of going to bed. Urine stream strong.    PMH: Past Medical History:  Diagnosis Date   GERD (gastroesophageal reflux disease)    Hyperlipidemia    Hypertension    Thyroid disease     Surgical History: Past Surgical History:  Procedure Laterality Date   ABDOMINAL HYSTERECTOMY     breast biopsies     BREAST BIOPSY Bilateral    beghin   CHOLECYSTECTOMY     COLONOSCOPY  06/02/1999   Dr. Gala Romney; internal and external hemorrhoids, poor prep.   COLONOSCOPY  09/08/2002   Dr. Gala Romney; friable internal hemorrhoids, scattered left-sided diverticula, otherwise normal colon and TI.   ESOPHAGOGASTRODUODENOSCOPY  2003   Dr. Gala Romney; normal esophagus s/p empiric dilation, superficial antral erosions.   ESOPHAGOGASTRODUODENOSCOPY  09/08/2002   Small hiatal hernia, otherwise normal.   ESOPHAGOGASTRODUODENOSCOPY  08/22/2007   Dr. Gala Romney; normal esophagus s/p empiric dilation, small hiatal hernia, benign-appearing polypoid antral mucosa, otherwise normal.   EYE SURGERY     HEMORRHOID SURGERY     THROAT SURGERY     Biopsy    Home Medications:  Allergies as of 09/20/2020       Reactions   Cyclobenzaprine    sick   Tramadol Other (See Comments)   sick        Medication List        Accurate as of September 20, 2020 11:26 AM. If you have any questions, ask your nurse or doctor.          amLODipine 2.5 MG tablet Commonly known as:  NORVASC Take 2.5 mg by mouth daily.   baclofen 10 MG tablet Commonly known as: LIORESAL Take 10 mg by mouth at bedtime.   Fish Oil 1000 MG Caps Take 1,000 mg by mouth daily.   FLUoxetine 20 MG capsule Commonly known as: PROZAC Take 20 mg by mouth daily.   HYDROcodone-acetaminophen 5-325 MG tablet Commonly known as: NORCO/VICODIN Take 0.5-1 tablets by mouth 2 (two) times daily as needed for severe pain.   levothyroxine 50 MCG tablet Commonly known as: SYNTHROID Take 1 tablet (50 mcg total) by mouth daily. What changed: when to take this   meclizine 25 MG tablet Commonly known as: ANTIVERT Take 50 mg by mouth every morning.   metoprolol tartrate 25 MG tablet Commonly known as: LOPRESSOR Take 25 mg by mouth daily.   omeprazole 40 MG capsule Commonly known as: PRILOSEC Take 1 capsule (40 mg total) by mouth 2 (two) times daily before a meal.   ondansetron 4 MG tablet Commonly known as: ZOFRAN Take 1 tablet (4 mg total) by mouth every 8 (eight) hours as needed for nausea or vomiting.   penicillin v potassium 500 MG tablet Commonly known as: VEETID Take 500 mg by mouth 4 (four) times daily.   predniSONE 5 MG (21) Tbpk tablet Commonly known as: STERAPRED UNI-PAK 21 TAB Take by mouth.   VITAMIN D  PO Take 1 tablet by mouth daily.   vitamin E 45 MG (100 UNITS) capsule Take 100 Units by mouth daily.        Allergies:  Allergies  Allergen Reactions   Cyclobenzaprine     sick   Tramadol Other (See Comments)    sick    Family History: Family History  Problem Relation Age of Onset   Cancer Mother    Heart failure Father     Social History:  reports that she has quit smoking. Her smoking use included cigarettes. She has never used smokeless tobacco. She reports that she does not drink alcohol and does not use drugs.  ROS: All other review of systems were reviewed and are negative except what is noted above in HPI  Physical Exam: BP 138/72   Pulse 81    Constitutional:  Alert and oriented, No acute distress. HEENT: [place] AT, moist mucus membranes.  Trachea midline, no masses. Cardiovascular: No clubbing, cyanosis, or edema. Respiratory: Normal respiratory effort, no increased work of breathing. GI: Abdomen is soft, nontender, nondistended, no abdominal masses GU: No CVA tenderness.  Lymph: No cervical or inguinal lymphadenopathy. Skin: No rashes, bruises or suspicious lesions. Neurologic: Grossly intact, no focal deficits, moving all 4 extremities. Psychiatric: Normal mood and affect.  Laboratory Data: Lab Results  Component Value Date   WBC 8.1 07/31/2020   HGB 13.5 07/31/2020   HCT 41.2 07/31/2020   MCV 94.9 07/31/2020   PLT 271 07/31/2020    Lab Results  Component Value Date   CREATININE 0.85 07/31/2020    No results found for: PSA  No results found for: TESTOSTERONE  No results found for: HGBA1C  Urinalysis    Component Value Date/Time   COLORURINE YELLOW 07/31/2020 1651   APPEARANCEUR CLEAR 07/31/2020 1651   LABSPEC 1.017 07/31/2020 1651   PHURINE 6.0 07/31/2020 1651   GLUCOSEU NEGATIVE 07/31/2020 1651   HGBUR NEGATIVE 07/31/2020 1651   BILIRUBINUR NEGATIVE 07/31/2020 1651   BILIRUBINUR negative 01/31/2015 1149   KETONESUR NEGATIVE 07/31/2020 1651   PROTEINUR NEGATIVE 07/31/2020 1651   UROBILINOGEN negative 01/31/2015 1149   NITRITE NEGATIVE 07/31/2020 1651   LEUKOCYTESUR NEGATIVE 07/31/2020 1651    Lab Results  Component Value Date   MUCUS negative 01/31/2015   BACTERIA negative 01/31/2015    Pertinent Imaging: Ct 07/31/2020: Images reviewed and discussed with the patient  No results found for this or any previous visit.  No results found for this or any previous visit.  No results found for this or any previous visit.  No results found for this or any previous visit.  No results found for this or any previous visit.  No results found for this or any previous visit.  No results found for this  or any previous visit.  No results found for this or any previous visit.   Assessment & Plan:    1. Renal lesion We discussed the natural hx of renal masses and the 80/20 malignant/benign likelihood. We disucssed the treatment options including active surveillance. Renal ablation, partial and radical nephrectomy.  - Urinalysis, Routine w reflex microscopic  2. OAB (overactive bladder) We will trial mirabegron '25mg'$  daily   No follow-ups on file.  Nicolette Bang, MD  [facility] [place]

## 2020-09-20 NOTE — Progress Notes (Signed)
Urological Symptom Review  Patient is experiencing the following symptoms: Hard to postpone urination Get up at night to urinate Leakage of urine   Review of Systems  Gastrointestinal (upper)  : Nausea Indigestion/heartburn  Gastrointestinal (lower) : Constipation  Constitutional : Night Sweats Fatigue  Skin: Itching  Eyes: Blurred vision  Ear/Nose/Throat : Negative for Ear/Nose/Throat symptoms  Hematologic/Lymphatic: Negative for Hematologic/Lymphatic symptoms  Cardiovascular : Chest pain  Respiratory : Shortness of breath  Endocrine: Excessive thirst  Musculoskeletal: Back pain Joint pain  Neurological: Headaches Dizziness  Psychologic: Negative for psychiatric symptoms

## 2020-09-20 NOTE — Patient Instructions (Signed)

## 2020-09-23 ENCOUNTER — Ambulatory Visit (HOSPITAL_COMMUNITY): Payer: Medicare Other | Admitting: Certified Registered Nurse Anesthetist

## 2020-09-23 ENCOUNTER — Ambulatory Visit (HOSPITAL_COMMUNITY)
Admission: RE | Admit: 2020-09-23 | Discharge: 2020-09-23 | Disposition: A | Discharge status: Home / Self Care | Payer: Medicare Other | Source: Ambulatory Visit | Attending: Internal Medicine | Admitting: Internal Medicine

## 2020-09-23 ENCOUNTER — Other Ambulatory Visit: Payer: Self-pay

## 2020-09-23 ENCOUNTER — Encounter (HOSPITAL_COMMUNITY): Payer: Self-pay | Admitting: Internal Medicine

## 2020-09-23 ENCOUNTER — Encounter (HOSPITAL_COMMUNITY)
Admission: RE | Disposition: A | Discharge status: Home / Self Care | Payer: Self-pay | Source: Ambulatory Visit | Attending: Internal Medicine

## 2020-09-23 DIAGNOSIS — K219 Gastro-esophageal reflux disease without esophagitis: Secondary | ICD-10-CM | POA: Diagnosis not present

## 2020-09-23 DIAGNOSIS — E785 Hyperlipidemia, unspecified: Secondary | ICD-10-CM | POA: Insufficient documentation

## 2020-09-23 DIAGNOSIS — R1310 Dysphagia, unspecified: Secondary | ICD-10-CM

## 2020-09-23 DIAGNOSIS — Z79890 Hormone replacement therapy (postmenopausal): Secondary | ICD-10-CM | POA: Insufficient documentation

## 2020-09-23 DIAGNOSIS — Z90710 Acquired absence of both cervix and uterus: Secondary | ICD-10-CM | POA: Insufficient documentation

## 2020-09-23 DIAGNOSIS — I10 Essential (primary) hypertension: Secondary | ICD-10-CM | POA: Insufficient documentation

## 2020-09-23 DIAGNOSIS — Z9049 Acquired absence of other specified parts of digestive tract: Secondary | ICD-10-CM | POA: Diagnosis not present

## 2020-09-23 DIAGNOSIS — Z8249 Family history of ischemic heart disease and other diseases of the circulatory system: Secondary | ICD-10-CM | POA: Insufficient documentation

## 2020-09-23 DIAGNOSIS — Z79899 Other long term (current) drug therapy: Secondary | ICD-10-CM | POA: Insufficient documentation

## 2020-09-23 DIAGNOSIS — K2950 Unspecified chronic gastritis without bleeding: Secondary | ICD-10-CM | POA: Diagnosis not present

## 2020-09-23 DIAGNOSIS — F17200 Nicotine dependence, unspecified, uncomplicated: Secondary | ICD-10-CM | POA: Insufficient documentation

## 2020-09-23 DIAGNOSIS — E079 Disorder of thyroid, unspecified: Secondary | ICD-10-CM | POA: Diagnosis not present

## 2020-09-23 DIAGNOSIS — Z8719 Personal history of other diseases of the digestive system: Secondary | ICD-10-CM | POA: Insufficient documentation

## 2020-09-23 HISTORY — PX: BIOPSY: SHX5522

## 2020-09-23 HISTORY — PX: MALONEY DILATION: SHX5535

## 2020-09-23 HISTORY — PX: ESOPHAGOGASTRODUODENOSCOPY (EGD) WITH PROPOFOL: SHX5813

## 2020-09-23 LAB — HEPATIC FUNCTION PANEL
ALT: 60 U/L — ABNORMAL HIGH (ref 0–44)
AST: 66 U/L — ABNORMAL HIGH (ref 15–41)
Albumin: 3.8 g/dL (ref 3.5–5.0)
Alkaline Phosphatase: 79 U/L (ref 38–126)
Bilirubin, Direct: <0.1 mg/dL (ref 0.0–0.2)
Total Bilirubin: 0.9 mg/dL (ref 0.3–1.2)
Total Protein: 6.9 g/dL (ref 6.5–8.1)

## 2020-09-23 SURGERY — ESOPHAGOGASTRODUODENOSCOPY (EGD) WITH PROPOFOL
Anesthesia: General

## 2020-09-23 MED ORDER — PROPOFOL 10 MG/ML IV BOLUS
INTRAVENOUS | Status: AC
  Filled 2020-09-23: qty 140

## 2020-09-23 MED ORDER — PROPOFOL 1000 MG/100ML IV EMUL
INTRAVENOUS | Status: AC
  Filled 2020-09-23: qty 200

## 2020-09-23 MED ORDER — PROPOFOL 10 MG/ML IV BOLUS
INTRAVENOUS | Status: DC | PRN
  Administered 2020-09-23: 40 mg via INTRAVENOUS

## 2020-09-23 MED ORDER — LACTATED RINGERS IV SOLN: INTRAVENOUS | Status: DC | PRN

## 2020-09-23 MED ORDER — STERILE WATER FOR IRRIGATION IR SOLN
Status: DC | PRN
  Administered 2020-09-23: 100 mL

## 2020-09-23 MED ORDER — PROPOFOL 1000 MG/100ML IV EMUL
INTRAVENOUS | Status: AC
  Filled 2020-09-23: qty 100

## 2020-09-23 MED ORDER — LIDOCAINE HCL (CARDIAC) PF 100 MG/5ML IV SOSY
PREFILLED_SYRINGE | INTRAVENOUS | Status: DC | PRN
  Administered 2020-09-23: 50 mg via INTRATRACHEAL

## 2020-09-23 MED ORDER — LACTATED RINGERS IV SOLN: INTRAVENOUS | Status: DC

## 2020-09-23 MED ORDER — PROPOFOL 500 MG/50ML IV EMUL
INTRAVENOUS | Status: DC | PRN
  Administered 2020-09-23: 150 ug/kg/min via INTRAVENOUS

## 2020-09-23 NOTE — Anesthesia Preprocedure Evaluation (Signed)
Anesthesia Evaluation  Patient identified by MRN, date of birth, ID band Patient awake    Reviewed: Allergy & Precautions, H&P , NPO status , Patient's Chart, lab work & pertinent test results, reviewed documented beta blocker date and time   Airway Mallampati: II  TM Distance: >3 FB Neck ROM: full    Dental no notable dental hx.    Pulmonary neg pulmonary ROS, Current Smoker and Patient abstained from smoking.,    Pulmonary exam normal breath sounds clear to auscultation       Cardiovascular Exercise Tolerance: Good hypertension, negative cardio ROS   Rhythm:regular Rate:Normal     Neuro/Psych PSYCHIATRIC DISORDERS Anxiety Depression  Neuromuscular disease    GI/Hepatic Neg liver ROS, GERD  Medicated,  Endo/Other  Hypothyroidism   Renal/GU negative Renal ROS  negative genitourinary   Musculoskeletal   Abdominal   Peds  Hematology negative hematology ROS (+)   Anesthesia Other Findings   Reproductive/Obstetrics negative OB ROS                             Anesthesia Physical Anesthesia Plan  ASA: 2  Anesthesia Plan: General   Post-op Pain Management:    Induction:   PONV Risk Score and Plan: Propofol infusion  Airway Management Planned:   Additional Equipment:   Intra-op Plan:   Post-operative Plan:   Informed Consent: I have reviewed the patients History and Physical, chart, labs and discussed the procedure including the risks, benefits and alternatives for the proposed anesthesia with the patient or authorized representative who has indicated his/her understanding and acceptance.     Dental Advisory Given  Plan Discussed with: CRNA  Anesthesia Plan Comments:         Anesthesia Quick Evaluation

## 2020-09-23 NOTE — Discharge Instructions (Signed)
EGD Discharge instructions Please read the instructions outlined below and refer to this sheet in the next few weeks. These discharge instructions provide you with general information on caring for yourself after you leave the hospital. Your doctor may also give you specific instructions. While your treatment has been planned according to the most current medical practices available, unavoidable complications occasionally occur. If you have any problems or questions after discharge, please call your doctor. ACTIVITY You may resume your regular activity but move at a slower pace for the next 24 hours.  Take frequent rest periods for the next 24 hours.  Walking will help expel (get rid of) the air and reduce the bloated feeling in your abdomen.  No driving for 24 hours (because of the anesthesia (medicine) used during the test).  You may shower.  Do not sign any important legal documents or operate any machinery for 24 hours (because of the anesthesia used during the test).  NUTRITION Drink plenty of fluids.  You may resume your normal diet.  Begin with a light meal and progress to your normal diet.  Avoid alcoholic beverages for 24 hours or as instructed by your caregiver.  MEDICATIONS You may resume your normal medications unless your caregiver tells you otherwise.  WHAT YOU CAN EXPECT TODAY You may experience abdominal discomfort such as a feeling of fullness or "gas" pains.  FOLLOW-UP Your doctor will discuss the results of your test with you.  SEEK IMMEDIATE MEDICAL ATTENTION IF ANY OF THE FOLLOWING OCCUR: Excessive nausea (feeling sick to your stomach) and/or vomiting.  Severe abdominal pain and distention (swelling).  Trouble swallowing.  Temperature over 101 F (37.8 C).  Rectal bleeding or vomiting of blood.    Your esophagus was stretched today  Samples of your stomach were taken to assess for any residual H. pylori infection  Stop omeprazole as it is not working well for  reflux  Begin Protonix 40 mg once daily in the morning.  New prescription provided.  Hepatic function profile today  Use a capful of MiraLAX at bedtime as needed for constipation  Office visit with Aliene Altes in 3 months  Further recommendations to follow pending review of pathology report  At patient request, I called Jacquilyn Benoit at [phone] -reviewed findings and recommendations.

## 2020-09-23 NOTE — Transfer of Care (Signed)
Immediate Anesthesia Transfer of Care Note  Patient: JAHAN LEATH  Procedure(s) Performed: ESOPHAGOGASTRODUODENOSCOPY (EGD) WITH PROPOFOL [place]  Patient Location: PACU  Anesthesia Type:General  Level of Consciousness: awake  Airway & Oxygen Therapy: Patient Spontanous Breathing  Post-op Assessment: Report given to RN and Post -op Vital signs reviewed and stable  Post vital signs: Reviewed and stable  Last Vitals:  Vitals Value Taken Time  BP    Temp    Pulse    Resp    SpO2      Last Pain:  Vitals:   09/23/20 0740  TempSrc:   PainSc: 0-No pain      Patients Stated Pain Goal: 9 ([id number])  Complications: No notable events documented.

## 2020-09-23 NOTE — Op Note (Signed)
[facility] Patient Name: Connie Kent Procedure Date: 09/23/2020 7:05 AM MRN: [id number] Date of Birth: [birth year] Attending MD: Norvel Richards , MD CSN: [id number] Age: 75 Admit Type: Outpatient Procedure:                Upper GI endoscopy Indications:              Dysphagia Providers:                Norvel Richards, MD, Tammy Vaught, RN,                            Suzan Garibaldi. Risa Grill, Technician Referring MD:              Medicines:                Propofol per Anesthesia Complications:            No immediate complications. Estimated Blood Loss:     Estimated blood loss: none. Estimated blood loss:                            none. Procedure:                Pre-Anesthesia Assessment:                           - Prior to the procedure, a History and Physical                            was performed, and patient medications and                            allergies were reviewed. The patient's tolerance of                            previous anesthesia was also reviewed. The risks                            and benefits of the procedure and the sedation                            options and risks were discussed with the patient.                            All questions were answered, and informed consent                            was obtained. Prior Anticoagulants: The patient has                            taken no previous anticoagulant or antiplatelet                            agents. ASA Grade Assessment: II - A patient with  mild systemic disease. After reviewing the risks                            and benefits, the patient was deemed in                            satisfactory condition to undergo the procedure.                           After obtaining informed consent, the endoscope was                            passed under direct vision. Throughout the                            procedure, the patient's blood pressure, pulse,  and                            oxygen saturations were monitored continuously. The                            [id number]) scope was introduced through the                            mouth, and advanced to the second part of duodenum.                            The upper GI endoscopy was accomplished without                            difficulty. The patient tolerated the procedure                            well. Scope In: 7:43:28 AM Scope Out: 7:48:47 AM Total Procedure Duration: 0 hours 5 minutes 19 seconds  Findings:      The examined esophagus was normal.      The entire examined stomach was normal. .      The duodenal bulb and second portion of the duodenum were normal. The       scope was withdrawn. Dilation was performed with a Maloney dilator with       mild resistance at 75 Fr. The dilation site was examined following       endoscope reinsertion and showed no change. Estimated blood loss: none.       Finally, gatric mucosa was biopsied with a cold forceps for histology.       Estimated blood loss was minimal Impression:               - Normal esophagus. Dilated.                           - Normal stomach. Biopsied.                           - Normal duodenal bulb and second portion of the  duodenum. Moderate Sedation:      Moderate (conscious) sedation was personally administered by an       anesthesia professional. The following parameters were monitored: oxygen       saturation, heart rate, blood pressure, respiratory rate, EKG, adequacy       of pulmonary ventilation, and response to care. Recommendation:           - Patient has a contact number available for                            emergencies. The signs and symptoms of potential                            delayed complications were discussed with the                            patient. Return to normal activities tomorrow.                            Written discharge instructions were  provided to the                            patient.                           - Advance diet as tolerated.                           - Continue present medications but stop omeprazole;                            trial of Protonix 40 mg once daily. New                            prescription provided. MiraLAX 17 g orally at                            bedtime for constipation. Follow-up on pathology.                            Repeat hepatic function profile today. Office visit                            with [place] in 3 months. Procedure Code(s):        --- Professional ---                           [phone], Esophagogastroduodenoscopy, flexible,                            transoral; with biopsy, single or multiple                           43450, Dilation of esophagus, by unguided sound or  bougie, single or multiple passes Diagnosis Code(s):        --- Professional ---                           R13.10, Dysphagia, unspecified CPT copyright 2019 American Medical Association. All rights reserved. The codes documented in this report are preliminary and upon coder review may  be revised to meet current compliance requirements. Cristopher Estimable. Kataleyah Carducci, MD Norvel Richards, MD 09/23/2020 8:33:05 AM This report has been signed electronically. Number of Addenda: 0

## 2020-09-23 NOTE — Anesthesia Postprocedure Evaluation (Signed)
Anesthesia Post Note  Patient: Connie Kent  Procedure(s) Performed: ESOPHAGOGASTRODUODENOSCOPY (EGD) WITH PROPOFOL [place]  Patient location during evaluation: Phase II Anesthesia Type: General Level of consciousness: awake Pain management: pain level controlled Vital Signs Assessment: post-procedure vital signs reviewed and stable Respiratory status: spontaneous breathing and respiratory function stable Cardiovascular status: blood pressure returned to baseline and stable Postop Assessment: no headache and no apparent nausea or vomiting Anesthetic complications: no Comments: Late entry   No notable events documented.   Last Vitals:  Vitals:   09/23/20 0652 09/23/20 0757  BP: (!) 148/77 (!) 156/69  Pulse:  84  Resp: 18 16  Temp: 36.7 C 36.6 C  SpO2: 97% 95%    Last Pain:  Vitals:   09/23/20 0757  TempSrc: Oral  PainSc: 0-No pain                 Louann Sjogren

## 2020-09-23 NOTE — H&P (Signed)
$'@LOGO'S$ @   Primary Care Physician:  Monico Blitz, MD Primary Gastroenterologist:  Dr. Gala Romney  Pre-Procedure History & Physical: HPI:  Connie Kent is a 75 y.o. female here for further evaluation of dysphagia via EGD.  GERD somewhat controlled on omeprazole 40 mg twice daily.  Recent abdominal pain and tachycardia.  ED visit CT negative except for right enhancing left renal lesion-follow-up with Dr. Alyson Ingles.  Mild bump in LFTs.  Gallbladder out.  Nothing on CT is warranted biliary dilation ghost or space-occupying liver lesion. Occasional bloating and constipation.  No treatment.   Past Medical History:  Diagnosis Date   GERD (gastroesophageal reflux disease)    Hyperlipidemia    Hypertension    Thyroid disease     Past Surgical History:  Procedure Laterality Date   ABDOMINAL HYSTERECTOMY     breast biopsies     BREAST BIOPSY Bilateral    beghin   CHOLECYSTECTOMY     COLONOSCOPY  06/02/1999   Dr. Gala Romney; internal and external hemorrhoids, poor prep.   COLONOSCOPY  09/08/2002   Dr. Gala Romney; friable internal hemorrhoids, scattered left-sided diverticula, otherwise normal colon and TI.   ESOPHAGOGASTRODUODENOSCOPY  2003   Dr. Gala Romney; normal esophagus s/p empiric dilation, superficial antral erosions.   ESOPHAGOGASTRODUODENOSCOPY  09/08/2002   Small hiatal hernia, otherwise normal.   ESOPHAGOGASTRODUODENOSCOPY  08/22/2007   Dr. Gala Romney; normal esophagus s/p empiric dilation, small hiatal hernia, benign-appearing polypoid antral mucosa, otherwise normal.   EYE SURGERY     HEMORRHOID SURGERY     THROAT SURGERY     Biopsy    Prior to Admission medications   Medication Sig Start Date End Date Taking? Authorizing Provider  amLODipine (NORVASC) 2.5 MG tablet Take 2.5 mg by mouth daily. 07/06/20  Yes [provider]  baclofen (LIORESAL) 10 MG tablet Take 10 mg by mouth at bedtime. 05/04/20  Yes [provider]  FLUoxetine (PROZAC) 20 MG capsule Take 20 mg by mouth  daily. 05/04/20  Yes [provider]  HYDROcodone-acetaminophen (NORCO/VICODIN) 5-325 MG tablet Take 0.5-1 tablets by mouth 2 (two) times daily as needed for severe pain. 04/19/20  Yes [provider]  levothyroxine (SYNTHROID, LEVOTHROID) 50 MCG tablet Take 1 tablet (50 mcg total) by mouth daily. Patient taking differently: Take 50 mcg by mouth daily before breakfast. 01/31/15  Yes Dettinger, Fransisca Kaufmann, MD  meclizine (ANTIVERT) 25 MG tablet Take 50 mg by mouth every morning. 05/04/20  Yes [provider]  metoprolol tartrate (LOPRESSOR) 25 MG tablet Take 25 mg by mouth daily. 06/04/16  Yes [provider]  mirabegron ER (MYRBETRIQ) 25 MG TB24 tablet Take 1 tablet (25 mg total) by mouth daily. 09/20/20  Yes McKenzie, Candee Furbish, MD  Omega-3 Fatty Acids (FISH OIL) 1000 MG CAPS Take 1,000 mg by mouth daily.   Yes [provider]  omeprazole (PRILOSEC) 40 MG capsule Take 1 capsule (40 mg total) by mouth 2 (two) times daily before a meal. 08/01/20  Yes Jodi Mourning, Kristen S, PA-C  ondansetron (ZOFRAN) 4 MG tablet Take 1 tablet (4 mg total) by mouth every 8 (eight) hours as needed for nausea or vomiting. 07/31/20  Yes Isla Pence, MD  penicillin v potassium (VEETID) 500 MG tablet Take 500 mg by mouth 4 (four) times daily. 09/17/20  Yes [provider]  predniSONE (STERAPRED UNI-PAK 21 TAB) 5 MG (21) TBPK tablet Take by mouth. 09/13/20  Yes [provider]  VITAMIN D PO Take 1 tablet by mouth daily.   Yes  [provider]  vitamin E 100 UNIT capsule Take 100 Units by mouth daily.   Yes [provider]    Allergies as of 08/08/2020   (No Known Allergies)    Family History  Problem Relation Age of Onset   Cancer Mother    Heart failure Father     Social History   Socioeconomic History   Marital status: Married    Spouse name: Not on file   Number of children: Not on file   Years of education: Not on file   Highest education  level: Not on file  Occupational History   Not on file  Tobacco Use   Smoking status: Some Days    Types: Cigarettes   Smokeless tobacco: Never  Vaping Use   Vaping Use: Never used  Substance and Sexual Activity   Alcohol use: No   Drug use: No   Sexual activity: Not Currently  Other Topics Concern   Not on file  Social History Narrative   Not on file   Social Determinants of Health   Financial Resource Strain: Not on file  Food Insecurity: Not on file  Transportation Needs: Not on file  Physical Activity: Not on file  Stress: Not on file  Social Connections: Not on file  Intimate Partner Violence: Not on file    Review of Systems: See HPI, otherwise negative ROS  Physical Exam: BP (!) 148/77   Temp 98 F (36.7 C) (Oral)   Resp 18   Ht '5\' 1"'$  (1.549 m)   Wt 77.1 kg   SpO2 97%   BMI 32.12 kg/m  General:   Alert,  Well-developed, well-nourished, pleasant and cooperative in NAD Neck:  Supple; no masses or thyromegaly. No significant cervical adenopathy. Lungs:  Clear throughout to auscultation.   No wheezes, crackles, or rhonchi. No acute distress. Heart:  Regular rate and rhythm; no murmurs, clicks, rubs,  or gallops. Abdomen: Non-distended, normal bowel sounds.  Soft and nontender without appreciable mass or hepatosplenomegaly.  Pulses:  Normal pulses noted. Extremities:  Without clubbing or edema.  Impression/Plan: 75 year old lady here for further evaluation of poorly controlled GERD and esophageal dysphagia.  Also notes bloating and constipation.  I have offered the patient a EGD with possible esophageal dilation as feasible/appropriate per plan. The risks, benefits, limitations, alternatives and imponderables have been reviewed with the patient. Potential for esophageal dilation, biopsy, etc. have also been reviewed.  Questions have been answered. All parties agreeable.      Notice: This dictation was prepared with Dragon dictation along with smaller phrase  technology. Any transcriptional errors that result from this process are unintentional and may not be corrected upon review.

## 2020-09-24 LAB — SURGICAL PATHOLOGY

## 2020-09-25 ENCOUNTER — Encounter: Payer: Self-pay | Admitting: Internal Medicine

## 2020-09-30 ENCOUNTER — Encounter (HOSPITAL_COMMUNITY): Payer: Self-pay | Admitting: Internal Medicine

## 2020-10-03 ENCOUNTER — Ambulatory Visit: Payer: Medicare Other | Admitting: Gastroenterology

## 2020-11-01 ENCOUNTER — Ambulatory Visit: Payer: Medicare Other | Admitting: Urology

## 2020-11-01 DIAGNOSIS — N289 Disorder of kidney and ureter, unspecified: Secondary | ICD-10-CM

## 2020-11-21 ENCOUNTER — Telehealth: Payer: Self-pay

## 2020-11-21 NOTE — Telephone Encounter (Signed)
Opened in error

## 2020-12-04 ENCOUNTER — Encounter: Payer: Self-pay | Admitting: Urology

## 2020-12-04 ENCOUNTER — Ambulatory Visit: Payer: Medicare Other | Admitting: Urology

## 2020-12-04 ENCOUNTER — Other Ambulatory Visit: Payer: Self-pay

## 2020-12-04 VITALS — BP 130/73 | HR 78

## 2020-12-04 DIAGNOSIS — N289 Disorder of kidney and ureter, unspecified: Secondary | ICD-10-CM | POA: Diagnosis not present

## 2020-12-04 DIAGNOSIS — N3281 Overactive bladder: Secondary | ICD-10-CM

## 2020-12-04 LAB — URINALYSIS, ROUTINE W REFLEX MICROSCOPIC
Bilirubin, UA: NEGATIVE
Glucose, UA: NEGATIVE
Ketones, UA: NEGATIVE
Leukocytes,UA: NEGATIVE
Nitrite, UA: NEGATIVE
Protein,UA: NEGATIVE
RBC, UA: NEGATIVE
Specific Gravity, UA: 1.025 (ref 1.005–1.030)
Urobilinogen, Ur: 0.2 mg/dL (ref 0.2–1.0)
pH, UA: 5.5 (ref 5.0–7.5)

## 2020-12-04 LAB — BLADDER SCAN AMB NON-IMAGING: Scan Result: 0

## 2020-12-04 MED ORDER — GEMTESA 75 MG PO TABS: 1.0000 | ORAL_TABLET | Freq: Every day | ORAL | 0 refills | Status: DC

## 2020-12-04 NOTE — Progress Notes (Signed)
12/04/2020 11:42 AM   Connie Kent 06/02/1945 [id number]  Referring provider: Monico Blitz, MD [address] [place],  [place] [address]  Followup OAB   HPI: Ms Rada is a 75yo here for followup for OAB. Last visit we start mirabegron 25mg . She noted no change in her urinary urgency, frequency nocturia or urge incontinence. She has urinary frequency every 45-60 minutes. She has daily urge incontinence episodes and uses 2-4 pads per day. Nocturia is 3-4x. No dysuria or hematuria. No other complaints today   PMH: Past Medical History:  Diagnosis Date   GERD (gastroesophageal reflux disease)    Hyperlipidemia    Hypertension    Thyroid disease     Surgical History: Past Surgical History:  Procedure Laterality Date   ABDOMINAL HYSTERECTOMY     BIOPSY  09/23/2020   Procedure: BIOPSY;  Surgeon: Daneil Dolin, MD;  [facility];  Service: Endoscopy;;   breast biopsies     BREAST BIOPSY Bilateral    beghin   CHOLECYSTECTOMY     COLONOSCOPY  06/02/1999   Dr. Gala Romney; internal and external hemorrhoids, poor prep.   COLONOSCOPY  09/08/2002   Dr. Gala Romney; friable internal hemorrhoids, scattered left-sided diverticula, otherwise normal colon and TI.   ESOPHAGOGASTRODUODENOSCOPY  2003   Dr. Gala Romney; normal esophagus s/p empiric dilation, superficial antral erosions.   ESOPHAGOGASTRODUODENOSCOPY  09/08/2002   Small hiatal hernia, otherwise normal.   ESOPHAGOGASTRODUODENOSCOPY  08/22/2007   Dr. Gala Romney; normal esophagus s/p empiric dilation, small hiatal hernia, benign-appearing polypoid antral mucosa, otherwise normal.   ESOPHAGOGASTRODUODENOSCOPY (EGD) WITH PROPOFOL N/A 09/23/2020   Procedure: ESOPHAGOGASTRODUODENOSCOPY (EGD) WITH PROPOFOL;  Surgeon: Daneil Dolin, MD;  [facility];  Service: Endoscopy;  Laterality: N/A;  7:30am   EYE SURGERY     HEMORRHOID SURGERY     MALONEY DILATION N/A 09/23/2020   Procedure: Venia Minks DILATION;  Surgeon: Daneil Dolin, MD;   [facility]: [facility];  Service: Endoscopy;  Laterality: N/A;   THROAT SURGERY     Biopsy    Home Medications:  Allergies as of 12/04/2020       Reactions   Cyclobenzaprine    sick   Tramadol Other (See Comments)   sick        Medication List        Accurate as of December 04, 2020 11:42 AM. If you have any questions, ask your nurse or doctor.          amLODipine 2.5 MG tablet Commonly known as: NORVASC Take 2.5 mg by mouth daily.   baclofen 10 MG tablet Commonly known as: LIORESAL Take 10 mg by mouth at bedtime.   Fish Oil 1000 MG Caps Take 1,000 mg by mouth daily.   FLUoxetine 20 MG capsule Commonly known as: PROZAC Take 20 mg by mouth daily.   HYDROcodone-acetaminophen 5-325 MG tablet Commonly known as: NORCO/VICODIN Take 0.5-1 tablets by mouth 2 (two) times daily as needed for severe pain.   levothyroxine 50 MCG tablet Commonly known as: SYNTHROID Take 1 tablet (50 mcg total) by mouth daily. What changed: when to take this   meclizine 25 MG tablet Commonly known as: ANTIVERT Take 50 mg by mouth every morning.   metoprolol tartrate 25 MG tablet Commonly known as: LOPRESSOR Take 25 mg by mouth daily.   mirabegron ER 25 MG Tb24 tablet Commonly known as: MYRBETRIQ Take 1 tablet (25 mg total) by mouth daily.   ondansetron 4 MG tablet Commonly known as: ZOFRAN  Take 1 tablet (4 mg total) by mouth every 8 (eight) hours as needed for nausea or vomiting.   pantoprazole 40 MG tablet Commonly known as: PROTONIX Take 40 mg by mouth daily.   penicillin v potassium 500 MG tablet Commonly known as: VEETID Take 500 mg by mouth 4 (four) times daily.   predniSONE 5 MG (21) Tbpk tablet Commonly known as: STERAPRED UNI-PAK 21 TAB Take by mouth.   VITAMIN D PO Take 1 tablet by mouth daily.   vitamin E 45 MG (100 UNITS) capsule Take 100 Units by mouth daily.        Allergies:  Allergies  Allergen Reactions   Cyclobenzaprine     sick    Tramadol Other (See Comments)    sick    Family History: Family History  Problem Relation Age of Onset   Cancer Mother    Heart failure Father     Social History:  reports that she has been smoking cigarettes. She has never used smokeless tobacco. She reports that she does not drink alcohol and does not use drugs.  ROS: All other review of systems were reviewed and are negative except what is noted above in HPI  Physical Exam: BP 130/73   Pulse 78   Constitutional:  Alert and oriented, No acute distress. HEENT: [place] AT, moist mucus membranes.  Trachea midline, no masses. Cardiovascular: No clubbing, cyanosis, or edema. Respiratory: Normal respiratory effort, no increased work of breathing. GI: Abdomen is soft, nontender, nondistended, no abdominal masses GU: No CVA tenderness.  Lymph: No cervical or inguinal lymphadenopathy. Skin: No rashes, bruises or suspicious lesions. Neurologic: Grossly intact, no focal deficits, moving all 4 extremities. Psychiatric: Normal mood and affect.  Laboratory Data: Lab Results  Component Value Date   WBC 8.1 07/31/2020   HGB 13.5 07/31/2020   HCT 41.2 07/31/2020   MCV 94.9 07/31/2020   PLT 271 07/31/2020    Lab Results  Component Value Date   CREATININE 0.85 07/31/2020    No results found for: PSA  No results found for: TESTOSTERONE  No results found for: HGBA1C  Urinalysis    Component Value Date/Time   COLORURINE YELLOW 07/31/2020 1651   APPEARANCEUR Clear 09/20/2020 1048   LABSPEC 1.017 07/31/2020 1651   PHURINE 6.0 07/31/2020 1651   GLUCOSEU Negative 09/20/2020 1048   HGBUR NEGATIVE 07/31/2020 1651   BILIRUBINUR Negative 09/20/2020 1048   KETONESUR NEGATIVE 07/31/2020 1651   PROTEINUR Negative 09/20/2020 1048   PROTEINUR NEGATIVE 07/31/2020 1651   UROBILINOGEN negative 01/31/2015 1149   NITRITE Negative 09/20/2020 1048   NITRITE NEGATIVE 07/31/2020 1651   LEUKOCYTESUR Negative 09/20/2020 1048   LEUKOCYTESUR  NEGATIVE 07/31/2020 1651    Lab Results  Component Value Date   LABMICR Comment 09/20/2020   MUCUS negative 01/31/2015   BACTERIA negative 01/31/2015    Pertinent Imaging:  No results found for this or any previous visit.  No results found for this or any previous visit.  No results found for this or any previous visit.  No results found for this or any previous visit.  No results found for this or any previous visit.  No results found for this or any previous visit.  No results found for this or any previous visit.  No results found for this or any previous visit.   Assessment & Plan:    1. OAB (overactive bladder) -We will trial Gemtesa 75mg  daily - Urinalysis, Routine w reflex microscopic - BLADDER SCAN AMB NON-IMAGING  No follow-ups on file.  Nicolette Bang, MD  [facility] [place]

## 2020-12-04 NOTE — Progress Notes (Signed)
post void residual=0  Urological Symptom Review  Patient is experiencing the following symptoms: Frequent urination Get up at night to urinate   Review of Systems  Gastrointestinal (upper)  : Nausea Indigestion/heartburn  Gastrointestinal (lower) : Negative for lower GI symptoms  Constitutional : Fever Fatigue  Skin: Itching  Eyes: Blurred vision  Ear/Nose/Throat : Negative for Ear/Nose/Throat symptoms  Hematologic/Lymphatic: Negative for Hematologic/Lymphatic symptoms  Cardiovascular : Chest pain  Respiratory : Cough Shortness of breath  Endocrine: Negative for endocrine symptoms  Musculoskeletal: Back pain Joint pain  Neurological: Headaches Dizziness  Psychologic: Negative for psychiatric symptoms

## 2020-12-04 NOTE — Patient Instructions (Signed)

## 2021-01-31 ENCOUNTER — Ambulatory Visit: Payer: Medicare Other | Admitting: Gastroenterology

## 2021-04-28 ENCOUNTER — Ambulatory Visit (HOSPITAL_COMMUNITY): Payer: Medicare PPO

## 2021-04-28 ENCOUNTER — Ambulatory Visit (HOSPITAL_COMMUNITY): Payer: Medicare Other

## 2021-05-01 ENCOUNTER — Ambulatory Visit (HOSPITAL_COMMUNITY)
Admission: RE | Admit: 2021-05-01 | Discharge: 2021-05-01 | Disposition: A | Discharge status: Home / Self Care | Payer: Medicare PPO | Source: Ambulatory Visit | Attending: Urology | Admitting: Urology

## 2021-05-01 DIAGNOSIS — N2889 Other specified disorders of kidney and ureter: Secondary | ICD-10-CM | POA: Insufficient documentation

## 2021-05-01 DIAGNOSIS — N289 Disorder of kidney and ureter, unspecified: Secondary | ICD-10-CM

## 2021-05-05 ENCOUNTER — Ambulatory Visit (INDEPENDENT_AMBULATORY_CARE_PROVIDER_SITE_OTHER): Payer: Medicare PPO | Admitting: Physician Assistant

## 2021-05-05 VITALS — BP 124/72 | HR 87

## 2021-05-05 DIAGNOSIS — N2889 Other specified disorders of kidney and ureter: Secondary | ICD-10-CM | POA: Diagnosis not present

## 2021-05-05 DIAGNOSIS — N3281 Overactive bladder: Secondary | ICD-10-CM | POA: Diagnosis not present

## 2021-05-05 DIAGNOSIS — N289 Disorder of kidney and ureter, unspecified: Secondary | ICD-10-CM

## 2021-05-05 LAB — URINALYSIS, ROUTINE W REFLEX MICROSCOPIC
Bilirubin, UA: NEGATIVE
Glucose, UA: NEGATIVE
Ketones, UA: NEGATIVE
Leukocytes,UA: NEGATIVE
Nitrite, UA: NEGATIVE
Protein,UA: NEGATIVE
RBC, UA: NEGATIVE
Specific Gravity, UA: 1.02 (ref 1.005–1.030)
Urobilinogen, Ur: 1 mg/dL (ref 0.2–1.0)
pH, UA: 7 (ref 5.0–7.5)

## 2021-05-05 NOTE — Progress Notes (Signed)
? ?Assessment: ?1. Renal lesion   ?2. OAB (overactive bladder)   ? ? ?Plan: ?Discussed [place] report/images with Dr. Alyson Ingles who does not feel the MRI is indicated at this time. Will repeat the [place] in 6 months.  Samples of Myrbetriq given which she will continue.  Follow-up in 6 months after ultrasound to discuss results and comparison. ? ?Chief Complaint: ?No chief complaint on file. ? ? ?HPI: ?Connie Kent is a 76 y.o. female who presents for continued evaluation of renal lesion as noted on Renal US in the past. She is doing well with her OAB on mirabegron '25mg'$  and has had no UTIs since last visit. NO gross hematuria. [place] indicates 1.4cm solid mass consistent with area seen on CT ? ?UA clear ? ?12/04/20 ?Connie Kent is a 76yo here for followup for OAB. Last visit we start mirabegron '25mg'$ . She noted no change in her urinary urgency, frequency nocturia or urge incontinence. She has urinary frequency every 45-60 minutes. She has daily urge incontinence episodes and uses 2-4 pads per day. Nocturia is 3-4x. No dysuria or hematuria. No other complaints today ?09/20/21 ?Connie Kent is a 76yo here for evaluation of a left renal mass. She 07/31/2020 for abdominal pain and was found to have a 1cm left mid pole exophytic renal mass. She denies any flank pain. No hematuria or dysuria. She has worsening urgency and urge incontinence for the past 3-4 years. She has associated nocturia 5-6x. She is bothered by her nocturia. NO fluid consumption with 2 hours of going to bed. Urine stream strong.  ?  ?Portions of the above documentation were copied from a prior visit for review purposes only. ? ?Allergies: ?Allergies  ?Allergen Reactions  ? Cyclobenzaprine   ?  sick  ? Tramadol Other (See Comments)  ?  sick  ? ? ?PMH: ?Past Medical History:  ?Diagnosis Date  ? GERD (gastroesophageal reflux disease)   ? Hyperlipidemia   ? Hypertension   ? Thyroid disease   ? ? ?PSH: ?Past Surgical History:  ?Procedure Laterality Date  ? ABDOMINAL  HYSTERECTOMY    ? BIOPSY  09/23/2020  ? Procedure: BIOPSY;  Surgeon: Daneil Dolin, MD;  [facility];  Service: Endoscopy;;  ? breast biopsies    ? BREAST BIOPSY Bilateral   ? beghin  ? CHOLECYSTECTOMY    ? COLONOSCOPY  06/02/1999  ? Dr. Gala Romney; internal and external hemorrhoids, poor prep.  ? COLONOSCOPY  09/08/2002  ? Dr. Gala Romney; friable internal hemorrhoids, scattered left-sided diverticula, otherwise normal colon and TI.  ? ESOPHAGOGASTRODUODENOSCOPY  2003  ? Dr. Gala Romney; normal esophagus s/p empiric dilation, superficial antral erosions.  ? ESOPHAGOGASTRODUODENOSCOPY  09/08/2002  ? Small hiatal hernia, otherwise normal.  ? ESOPHAGOGASTRODUODENOSCOPY  08/22/2007  ? Dr. Gala Romney; normal esophagus s/p empiric dilation, small hiatal hernia, benign-appearing polypoid antral mucosa, otherwise normal.  ? ESOPHAGOGASTRODUODENOSCOPY (EGD) WITH PROPOFOL N/A 09/23/2020  ? Procedure: ESOPHAGOGASTRODUODENOSCOPY (EGD) WITH PROPOFOL;  Surgeon: Daneil Dolin, MD;  [facility];  Service: Endoscopy;  Laterality: N/A;  7:30am  ? EYE SURGERY    ? HEMORRHOID SURGERY    ? MALONEY DILATION N/A 09/23/2020  ? Procedure: MALONEY DILATION;  Surgeon: Daneil Dolin, MD;  [facility];  Service: Endoscopy;  Laterality: N/A;  ? THROAT SURGERY    ? Biopsy  ? ? ?SH: ?Social History  ? ?Tobacco Use  ? Smoking status: Some Days  ?  Types: Cigarettes  ? Smokeless tobacco: Never  ?Vaping  Use  ? Vaping Use: Never used  ?Substance Use Topics  ? Alcohol use: No  ? Drug use: No  ? ? ?ROS: ?See HPI ? ?PE: ?BP 124/72   Pulse 87  ?GENERAL APPEARANCE:  Well appearing, well developed, well nourished, NAD ?HEENT:  Atraumatic, normocephalic ?NECK:  Supple. Trachea midline ?ABDOMEN:  Soft, non-tender, no masses ?EXTREMITIES:  Without clubbing, cyanosis, or edema ?NEUROLOGIC:  Alert and oriented ?MENTAL STATUS:  Appropriate ?BACK:  Non-tender to palpation, No CVAT ?SKIN:  Warm, dry, and intact ? ? ?Results: ?Laboratory  Data: ?Lab Results  ?Component Value Date  ? WBC 8.1 07/31/2020  ? HGB 13.5 07/31/2020  ? HCT 41.2 07/31/2020  ? MCV 94.9 07/31/2020  ? PLT 271 07/31/2020  ? ? ?Lab Results  ?Component Value Date  ? CREATININE 0.85 07/31/2020  ? ? ?Urinalysis ?   ?Component Value Date/Time  ? COLORURINE YELLOW 07/31/2020 1651  ? APPEARANCEUR Clear 12/04/2020 1102  ? LABSPEC 1.017 07/31/2020 1651  ? PHURINE 6.0 07/31/2020 1651  ? GLUCOSEU Negative 12/04/2020 1102  ? HGBUR NEGATIVE 07/31/2020 1651  ? BILIRUBINUR Negative 12/04/2020 1102  ? Benjamin Stain NEGATIVE 07/31/2020 1651  ? PROTEINUR Negative 12/04/2020 1102  ? PROTEINUR NEGATIVE 07/31/2020 1651  ? UROBILINOGEN negative 01/31/2015 1149  ? NITRITE Negative 12/04/2020 1102  ? NITRITE NEGATIVE 07/31/2020 1651  ? LEUKOCYTESUR Negative 12/04/2020 1102  ? LEUKOCYTESUR NEGATIVE 07/31/2020 1651  ? ? ?Lab Results  ?Component Value Date  ? LABMICR Comment 12/04/2020  ? MUCUS negative 01/31/2015  ? BACTERIA negative 01/31/2015  ? ? ?Pertinent Imaging: ? ?Results for orders placed during the hospital encounter of 05/01/21 ? ?Ultrasound renal complete ? ?Narrative ?CLINICAL DATA:  Renal lesion seen on CT ? ?EXAM: ?RENAL / URINARY TRACT ULTRASOUND COMPLETE ? ?COMPARISON:  CT 07/31/2020 ? ?FINDINGS: ?Right Kidney: ? ?Renal measurements: 9.5 x 3.5 x 5.5 cm = volume: 94.7 mL. ?Echogenicity is within normal limits. No hydronephrosis. Simple cyst ?at the lower pole measuring 2.1 x 1.4 x 1.9 cm, no follow-up ?recommended. ? ?Left Kidney: ? ?Renal measurements: 10.5 x 4.9 x 6.2 cm = volume: 166.6 mL. ?Echogenicity is normal. Anechoic structure at left renal pelvis ?possibly representing a large parapelvic cyst, this appearance is ?suggested on prior CT. Subtle heterogeneous solid-appearing mass at ?the upper pole measuring 1.4 by 1.4 x 1.4 cm. ? ?Bladder: ? ?Appears normal for degree of bladder distention. ? ?Other: ? ?None. ? ?IMPRESSION: ?1. Heterogeneous 1.4 cm mostly solid mass at the upper pole of  left ?kidney likely corresponding to the lesion described on CT, slightly ?increased in size from prior measurement of 1 cm. Further ?characterization could be considered with MRI. ? ? ?Electronically Signed ?By: Donavan Foil M.D. ?On: 05/03/2021 20:49 ? ? ?No results found for this or any previous visit (from the past 24 hour(s)).  ?

## 2021-05-09 ENCOUNTER — Telehealth: Payer: Self-pay

## 2021-05-09 NOTE — Telephone Encounter (Signed)
Daughter called to ask if Connie Kent needs MRI as her ultrasound stated. Message sent to MD ?

## 2021-05-14 DIAGNOSIS — Z299 Encounter for prophylactic measures, unspecified: Secondary | ICD-10-CM | POA: Diagnosis not present

## 2021-05-14 DIAGNOSIS — Z6832 Body mass index (BMI) 32.0-32.9, adult: Secondary | ICD-10-CM | POA: Diagnosis not present

## 2021-05-14 DIAGNOSIS — E039 Hypothyroidism, unspecified: Secondary | ICD-10-CM | POA: Diagnosis not present

## 2021-05-14 DIAGNOSIS — I10 Essential (primary) hypertension: Secondary | ICD-10-CM | POA: Diagnosis not present

## 2021-05-14 DIAGNOSIS — F339 Major depressive disorder, recurrent, unspecified: Secondary | ICD-10-CM | POA: Diagnosis not present

## 2021-05-14 DIAGNOSIS — R002 Palpitations: Secondary | ICD-10-CM | POA: Diagnosis not present

## 2021-05-20 ENCOUNTER — Telehealth: Payer: Self-pay

## 2021-05-20 DIAGNOSIS — R002 Palpitations: Secondary | ICD-10-CM | POA: Diagnosis not present

## 2021-05-20 NOTE — Telephone Encounter (Signed)
Patient left a voice message 05-16-21. ?Call to find out recent test results. ? ?Please advise. ? ? ?Call back:  [phone] ? ?Thanks, ?Helene Kelp ? ?

## 2021-05-21 NOTE — Telephone Encounter (Signed)
Patient was informed per Dr. Alyson Ingles,  ultrasound report unchanged from previous exams. Will see patient back in 6 months. Patient voiced understanding ?

## 2021-05-26 NOTE — Progress Notes (Signed)
Referring Provider: Monico Blitz, MD Primary Care Physician:  Monico Blitz, MD Primary GI Physician: Dr. Gala Romney  Chief Complaint  Patient presents with   Nausea    Patient states she has nausea and abdominal pain. Has pain and nausea every time she eats, also when bending over. Takes otc nasuea med.     HPI:   Connie LANZO is a 76 y.o. female  with a history of GERD, dyspepsia, IBS, H. pylori s/p treatment x2-3, presenting today for follow-up on dysphagia, abdominal pain, nausea without vomiting, change in bowel habits and elevated LFTs s/p EGD.  She was last seen in our office 07/31/2020 reporting 3-week history of mid to upper abdominal pain that was constant but worse with movement or meals and nausea without vomiting.  Also with recurrent dysphagia to solids and liquids. Reflux symptoms well controlled on omeprazole 40 mg daily. She had been taking BC powders twice daily for years but stopped 2 days prior to office visit.  Since her abdominal pain and nausea started, she also noticed decreased bowel frequency though she also had minimal food intake.  Ongoing chronic intermittent bright red blood per rectum without change in the setting of known hemorrhoids.  On exam, she had moderate to severe generalized tenderness to palpation, greatest in the epigastric region. Due to HR of 122 and reports of dark urine, recommended ED evaluation as she likely needed IV fluids. Also recommended updating labs and obtaining CT scan. Would need EGD in the near future.   She was evaluated in the emergency room. CT A/P with contrast with no acute findings.  She did have a 10 mm enhancing lesion of the left kidney with recommendations for urology consultation for further evaluation (referral placed).  CBC was normal.  Lipase normal.  CMP with mildly elevated LFTs which she has also had in the past.  Liver looked okay on CT. She was given IV fluids and was prescribed Zofran and HR improved. I recommended  increasing omeprazole to 40 mg twice daily and proceeding with EGD.  Also recommended MiraLAX for constipation.  EGD 09/23/2020 with normal esophagus dilated, normal stomach biopsied, normal examined duodenum.  Pathology with mild chronic inflammation and reactive changes, negative for H. pylori.  Recommended stopping omeprazole and starting Protonix 40 mg daily.  Also recommend repeating HFP.  HFP completed with liver enzymes elevated, but stable with AST 66, ALT 60.   Today:  Ongoing trouble with abdominal pain.  States when she is up doing activity, she develops periumbilical and upper abdominal pain with nausea.  Symptoms improved with rest.  Also develops periumbilical and upper abdominal pain postprandially as well as bloating.  Reports she has to watch what she eats, but cannot tell me specific triggers.  States she is having to eat smaller portions.  Has early satiety.  She has eliminated bacon and sausage.  No reflux symptoms on Protonix 40 mg daily.  Changing omeprazole to Protonix has helped her reflux, but did not make any difference in her abdominal pain. No dysphagia. No vomiting.   She is still taking BC powders.  Reports she stopped taking them for a couple of weeks to see if this would help her symptoms, but made no difference, so she resumed taking them.  Currently taking 2 a day.  Bowels are moving well. Hasn't needed MiraLAX recently. Will use as needed.   Dr. Manuella Ghazi wanted her to complete a heart monitor because of elevated heart rate, but could not complete  this because she had issues with skin reaction.   Elevated LFTs:  Mild elevation in 2015/2016.  Normalized in August 2016.   Labs updated July 2022 when she was found to have AST 74, ALT 69. Occasional tylenol. No alcohol. No illicut drug use. No Fhx of liver. No autoimmune conditions.   Past Medical History:  Diagnosis Date   GERD (gastroesophageal reflux disease)    H. pylori infection    Confirmed eradication on EGD  in September 2022   Hyperlipidemia    Hypertension    Thyroid disease     Past Surgical History:  Procedure Laterality Date   ABDOMINAL HYSTERECTOMY     BIOPSY  09/23/2020   Procedure: BIOPSY;  Surgeon: Daneil Dolin, MD;  Location: [facility];  Service: Endoscopy;;   breast biopsies     BREAST BIOPSY Bilateral    beghin   CHOLECYSTECTOMY     COLONOSCOPY  06/02/1999   Dr. Gala Romney; internal and external hemorrhoids, poor prep.   COLONOSCOPY  09/08/2002   Dr. Gala Romney; friable internal hemorrhoids, scattered left-sided diverticula, otherwise normal colon and TI.   ESOPHAGOGASTRODUODENOSCOPY  2003   Dr. Gala Romney; normal esophagus s/p empiric dilation, superficial antral erosions.   ESOPHAGOGASTRODUODENOSCOPY  09/08/2002   Small hiatal hernia, otherwise normal.   ESOPHAGOGASTRODUODENOSCOPY  08/22/2007   Dr. Gala Romney; normal esophagus s/p empiric dilation, small hiatal hernia, benign-appearing polypoid antral mucosa, otherwise normal.   ESOPHAGOGASTRODUODENOSCOPY (EGD) WITH PROPOFOL N/A 09/23/2020   Procedure: ESOPHAGOGASTRODUODENOSCOPY (EGD) WITH PROPOFOL;  Surgeon: Daneil Dolin, MD;  Location: [facility];  Service: Endoscopy;  Laterality: N/A;  7:30am   EYE SURGERY     HEMORRHOID SURGERY     MALONEY DILATION N/A 09/23/2020   Procedure: Venia Minks DILATION;  Surgeon: Daneil Dolin, MD;  Location: [facility];  Service: Endoscopy;  Laterality: N/A;   THROAT SURGERY     Biopsy    Current Outpatient Medications  Medication Sig Dispense Refill   amLODipine (NORVASC) 2.5 MG tablet Take 2.5 mg by mouth daily.     baclofen (LIORESAL) 10 MG tablet Take 10 mg by mouth at bedtime.     FLUoxetine (PROZAC) 20 MG capsule Take 20 mg by mouth daily.     hydrochlorothiazide (HYDRODIURIL) 25 MG tablet Take by mouth daily.     HYDROcodone-acetaminophen (NORCO/VICODIN) 5-325 MG tablet Take 0.5-1 tablets by mouth 2 (two) times daily as needed for severe pain.     levothyroxine (SYNTHROID, LEVOTHROID)  50 MCG tablet Take 1 tablet (50 mcg total) by mouth daily. (Patient taking differently: Take 50 mcg by mouth daily before breakfast.) 90 tablet 1   meclizine (ANTIVERT) 25 MG tablet Take 50 mg by mouth every morning.     metoprolol tartrate (LOPRESSOR) 25 MG tablet Take 25 mg by mouth daily.     mirabegron ER (MYRBETRIQ) 25 MG TB24 tablet Take 1 tablet (25 mg total) by mouth daily. 30 tablet 0   Omega-3 Fatty Acids (FISH OIL) 1000 MG CAPS Take 1,000 mg by mouth daily.     pantoprazole (PROTONIX) 40 MG tablet Take 40 mg by mouth daily.     predniSONE (STERAPRED UNI-PAK 21 TAB) 5 MG (21) TBPK tablet Take 5 mg by mouth. One daily     Vibegron (GEMTESA) 75 MG TABS Take 1 capsule by mouth daily. 30 tablet 0   VITAMIN D PO Take 1 tablet by mouth daily.     vitamin E 100 UNIT capsule Take 100 Units by mouth daily.  ondansetron (ZOFRAN) 4 MG tablet Take 1 tablet (4 mg total) by mouth every 8 (eight) hours as needed for nausea or vomiting. (Patient not taking: Reported on 05/28/2021) 10 tablet 0   No current facility-administered medications for this visit.    Allergies as of 05/28/2021 - Review Complete 05/28/2021  Allergen Reaction Noted   Cyclobenzaprine  03/12/2016   Tramadol Other (See Comments) 03/12/2016    Family History  Problem Relation Age of Onset   Cancer Mother    Heart failure Father     Social History   Socioeconomic History   Marital status: Married    Spouse name: Not on file   Number of children: Not on file   Years of education: Not on file   Highest education level: Not on file  Occupational History   Not on file  Tobacco Use   Smoking status: Former    Types: Cigarettes    Passive exposure: Past   Smokeless tobacco: Never  Vaping Use   Vaping Use: Never used  Substance and Sexual Activity   Alcohol use: No   Drug use: No   Sexual activity: Not Currently  Other Topics Concern   Not on file  Social History Narrative   Not on file   Social Determinants  of Health   Financial Resource Strain: Not on file  Food Insecurity: Not on file  Transportation Needs: Not on file  Physical Activity: Not on file  Stress: Not on file  Social Connections: Not on file    Review of Systems: Gen: Denies fever, chills, cold or flu like symptoms, pre-syncope, or syncope.  CV: Denies chest pain or palpitations.. Resp: Denies dyspnea or cough.  GI: See HPI Heme: See HPI  Physical Exam: BP 136/80 (BP Location: Left Arm, Patient Position: Sitting, Cuff Size: Large)   Pulse 81   Temp (!) 97.2 F (36.2 C) (Oral)   Ht '5\' 1"'$  (1.549 m)   Wt 164 lb 3.2 oz (74.5 kg)   BMI 31.03 kg/m  General:   Alert and oriented. No distress noted. Pleasant and cooperative.  Head:  Normocephalic and atraumatic. Eyes:  Conjuctiva clear without scleral icterus. Heart:  S1, S2 present without murmurs appreciated. Lungs:  Clear to auscultation bilaterally. No wheezes, rales, or rhonchi. No distress.  Abdomen:  +BS, soft, non-tender and non-distended. No rebound or guarding. No HSM or masses noted. Msk:  Symmetrical without gross deformities. Normal posture. Extremities:  Without edema. Neurologic:  Alert and  oriented x4 Psych:  Normal mood and affect.    Assessment:  76 year old female with history of GERD, dyspepsia, IBS, H. pylori s/p treatment with confirmed eradication by EGD in September 2022, presenting today for follow-up of abdominal pain, nausea without vomiting, dysphagia, constipation, and elevated LFTs.  Also reporting chest pain.  Abdominal pain: Periumbilical and upper abdominal pain with associated nausea without vomiting triggered by activity or meals.  When triggered by activity, symptoms will improve with rest.  Also notes early satiety, abdominal bloating.  Prior evaluation in July 2022 for the same symptoms with CBC, CMP, lipase unrevealing aside from mildly elevated LFTs which I do not think is related to her abdominal pain. CT A/P with contrast in  July without significant abnormalities. EGD in September 2022 unremarkable. History of cholecystectomy.  She was on omeprazole twice daily at that time of EGD which was changed to Protonix 40 mg daily.  This change improved her reflux symptoms, but no effect on her abdominal pain.  She  has no known cardiac history, but is reporting exertional chest pain with shortness of breath, and  I do note that she had atherosclerotic calcification of her abdominal aorta on her CT in July.  She has lost about 5 pounds over the last 10 months.  Differentials include mesenteric ischemia, cardiac etiology, gastroparesis, non-ulcer dyspepsia, small bowel inflammation/ulceration related to chronic BC powder use, but this would not explain her exertional symptoms. I have recommended CT angiography, gastric emptying study, discontinue BC powders, and refer to cardiology.  Chest pain: Exertional chest pain with associated shortness of breath. Will refer to cardiology for evaluation.   GERD:  Well controlled on Protonix 40 mg daily.   Dysphagia: Resolved s/p EGD revealing normal esophagus s/p empiric dilation.  Constipation:  Well controlled with MiraLAX prn.  No alarm symptoms.  She is overdue for colonoscopy, but will need chest pain addressed prior to scheduling.   Elevated LFTs:  Mildly elevated liver enzymes in 2015/2016.  Normalized in August 2016.  No repeat labs until July 2022 when she was found to have AST 74, ALT 69.  CT last year without any significant liver abnormalities.  Spleen normal.  Platelets have been normal.  Suspect we are likely dealing with fatty liver/NASH in the setting of HTN, HLD, obesity.  Admits to occasional Tylenol use, but nothing routine.  No alcohol use, illicit drug use, family history of liver disease, or family history of autoimmune conditions. We will complete basic work up to rule out viral hepatitis and hemochromatosis, update labs. She is having a CT scan due to her abdominal  pain discussed above which will re-evaluate her liver.    Plan:  CBC, CMP, hepatitis A antibody total, hepatitis B surface antibody, hepatitis B surface antigen, hepatitis B core antibody, hepatitis C antibody, iron panel with ferritin. CT A/P angiography with and without contrast. Gastric emptying study. Refer to cardiology. Continue Protonix 40 mg daily. Continue MiraLAX as needed. Stop BC powders and avoid all NSAIDs.  Eat 4-6 small meals daily.  Avoid fried/fatty/greasy/spicy foods.  Requested patient to keep a dietary log of when her abdominal pain occurs to help identify specific triggers. Further recommendations pending labs and imaging.    Aliene Altes, PA-C [facility] 05/28/2021

## 2021-05-28 ENCOUNTER — Encounter: Payer: Self-pay | Admitting: Gastroenterology

## 2021-05-28 ENCOUNTER — Encounter: Payer: Self-pay | Admitting: *Deleted

## 2021-05-28 ENCOUNTER — Ambulatory Visit: Payer: Medicare PPO | Admitting: Gastroenterology

## 2021-05-28 VITALS — BP 136/80 | HR 81 | Temp 97.2°F | Ht 61.0 in | Wt 164.2 lb

## 2021-05-28 DIAGNOSIS — R110 Nausea: Secondary | ICD-10-CM | POA: Diagnosis not present

## 2021-05-28 DIAGNOSIS — R7989 Other specified abnormal findings of blood chemistry: Secondary | ICD-10-CM

## 2021-05-28 DIAGNOSIS — R1310 Dysphagia, unspecified: Secondary | ICD-10-CM | POA: Diagnosis not present

## 2021-05-28 DIAGNOSIS — R1033 Periumbilical pain: Secondary | ICD-10-CM | POA: Diagnosis not present

## 2021-05-28 DIAGNOSIS — K5900 Constipation, unspecified: Secondary | ICD-10-CM

## 2021-05-28 DIAGNOSIS — R079 Chest pain, unspecified: Secondary | ICD-10-CM

## 2021-05-28 DIAGNOSIS — K219 Gastro-esophageal reflux disease without esophagitis: Secondary | ICD-10-CM

## 2021-05-28 DIAGNOSIS — R0602 Shortness of breath: Secondary | ICD-10-CM | POA: Diagnosis not present

## 2021-05-28 NOTE — Patient Instructions (Signed)
We will arrange for you to have a gastric emptying study at [facility]. ? ?We will also arrange for you to have a CT angiogram of your abdomen to take a closer look at the blood vessels in your abdomen.. ? ?Please have blood work completed at [facility]. ? ?We are placing a referral to cardiology to further evaluate your chest pain and shortness of breath. ? ?Continue Protonix 40 mg daily for reflux. ? ?Continue to use MiraLAX as needed for constipation. ? ?Stop using BC powders and avoid all NSAID products including ibuprofen, Aleve, Advil, Goody powders, and anything that says "NSAID" on the package. ? ?Avoid fried, fatty, greasy, spicy foods. ? ?Try eating 4-6 small meals daily. ? ?Keep a dietary log of when her abdominal pain occurs.  Write down the food that you had eaten prior to symptom onset to help identify specific triggers. ? ?Aliene Altes, PA-C ?[facility] ? ? ?

## 2021-05-29 ENCOUNTER — Telehealth: Payer: Self-pay | Admitting: *Deleted

## 2021-05-29 ENCOUNTER — Encounter: Payer: Self-pay | Admitting: Gastroenterology

## 2021-05-29 NOTE — Telephone Encounter (Signed)
PA submitted via [employer]. Clinicals sent. Tracking # [id number]

## 2021-06-02 NOTE — Telephone Encounter (Signed)
CT angio approved. [employer]# [id number], valid 05/30/21-06/29/21.  CT angio scheduled for 06/04/21 at 4:30pm, arrive at 4:00pm. Liquids only for 4 hours before test.  Called pt, informed her of CT angio appt. Reminded her to have labs done before CT. She will have labs done today.

## 2021-06-04 ENCOUNTER — Ambulatory Visit (HOSPITAL_COMMUNITY): Payer: Medicare PPO

## 2021-06-04 ENCOUNTER — Encounter (HOSPITAL_COMMUNITY): Payer: Medicare PPO

## 2021-06-04 DIAGNOSIS — Z6831 Body mass index (BMI) 31.0-31.9, adult: Secondary | ICD-10-CM | POA: Diagnosis not present

## 2021-06-04 DIAGNOSIS — E1129 Type 2 diabetes mellitus with other diabetic kidney complication: Secondary | ICD-10-CM | POA: Diagnosis not present

## 2021-06-04 DIAGNOSIS — Z299 Encounter for prophylactic measures, unspecified: Secondary | ICD-10-CM | POA: Diagnosis not present

## 2021-06-04 DIAGNOSIS — E1165 Type 2 diabetes mellitus with hyperglycemia: Secondary | ICD-10-CM | POA: Diagnosis not present

## 2021-06-04 DIAGNOSIS — I10 Essential (primary) hypertension: Secondary | ICD-10-CM | POA: Diagnosis not present

## 2021-06-11 ENCOUNTER — Encounter (HOSPITAL_COMMUNITY): Payer: Medicare PPO

## 2021-06-14 NOTE — Telephone Encounter (Signed)
See other task

## 2021-06-16 ENCOUNTER — Telehealth: Payer: Self-pay | Admitting: Internal Medicine

## 2021-06-16 NOTE — Telephone Encounter (Signed)
Pt called to let us know that she didn't go to have her gastric emptying done on 5/31 and said that she is having problems with her bowels especially when she bends over. Please call her at [phone]

## 2021-06-16 NOTE — Telephone Encounter (Signed)
Called pt and provided # to nuc med to get her GES rescheduled

## 2021-06-16 NOTE — Telephone Encounter (Addendum)
Diarrhea is new.  When I saw her in the office recently, she was controlling constipation with MiraLAX.  Is she still taking MiraLAX?  If so, she needs to stop this and monitor for ongoing diarrhea.  If she is not taking MiraLAX and having diarrhea, we need to obtain stool studies to rule out infection.   Regarding her rectal bleeding, this is a chronic issue for her in the setting of hemorrhoids.  She is overdue for colonoscopy, but we referred her to cardiology at her last visit due to reported chest pain.  Looks like she refused to schedule with cardiology and they closed referral.  She needs to see cardiology to get her chest pain evaluated so we can proceed with a colonoscopy in the near future.  She can call their office back to schedule an appointment.  She needs to have all labs completed I ordered at her OV. This will also ensure her hemoglobin is staying stable despite rectal bleeding.  I can also send in Anusol rectal cream for hemorrhoids.  She also needs to call to get CTA rescheduled as this was scheduled on 5/24, but she did not show for this.  She will need to call central scheduling to reschedule at [phone].

## 2021-06-16 NOTE — Telephone Encounter (Signed)
Spoke to pt, she informed me that she is having loose stool several times a day. She also, states she has blood when she wipes. She changes her pads about 5 to 6 times a day. She did reschedule her GES for next week, but she states she needs something done now.

## 2021-06-16 NOTE — Telephone Encounter (Signed)
Spoke to pt again,  she informed me that she was still taking MiraLax. Informed her to stop taking the MiraLax. Explained to her that she needs to go to the cardiologist before we could schedule a colonoscopy. She stated she is not worried about chest pain, she needs to get these loose stools taking care of first. I explained they won't do a colonoscopy with out her seeing a cardiologist first. She states ok she will make an appointment. Reminded her to have labs drawn. She stated she forgot and would go one day this week. Informed her that some Anusol rectal cream can be sent to her pharmacy. She stated that would be fine, [facility] in [place]. Told to call and reschedule CTA .

## 2021-06-16 NOTE — Telephone Encounter (Signed)
Note.  Have her call back if diarrhea persist after being off of MiraLAX for a few days.  We will obtain stool studies if this is the case.

## 2021-06-17 ENCOUNTER — Other Ambulatory Visit: Payer: Self-pay | Admitting: Gastroenterology

## 2021-06-17 DIAGNOSIS — K649 Unspecified hemorrhoids: Secondary | ICD-10-CM

## 2021-06-17 DIAGNOSIS — K625 Hemorrhage of anus and rectum: Secondary | ICD-10-CM

## 2021-06-17 MED ORDER — HYDROCORTISONE (PERIANAL) 2.5 % EX CREA: 1.0000 "application " | TOPICAL_CREAM | Freq: Two times a day (BID) | CUTANEOUS | 1 refills | Status: DC

## 2021-06-17 NOTE — Telephone Encounter (Signed)
Spoke to pt, she stated she to [facility] to pick up Anusol rectal cream and they told her they didn't have a prescription. She stated her diarrhea is getting better,  she has not had a stool today

## 2021-06-17 NOTE — Telephone Encounter (Signed)
Rx sent 

## 2021-06-18 NOTE — Telephone Encounter (Signed)
Noted  

## 2021-06-20 DIAGNOSIS — R110 Nausea: Secondary | ICD-10-CM | POA: Diagnosis not present

## 2021-06-20 DIAGNOSIS — R7989 Other specified abnormal findings of blood chemistry: Secondary | ICD-10-CM | POA: Diagnosis not present

## 2021-06-20 DIAGNOSIS — R1033 Periumbilical pain: Secondary | ICD-10-CM | POA: Diagnosis not present

## 2021-06-23 LAB — COMPLETE METABOLIC PANEL WITH GFR
AG Ratio: 1.4 (calc) (ref 1.0–2.5)
ALT: 29 U/L (ref 6–29)
AST: 42 U/L — ABNORMAL HIGH (ref 10–35)
Albumin: 4 g/dL (ref 3.6–5.1)
Alkaline phosphatase (APISO): 91 U/L (ref 37–153)
BUN: 14 mg/dL (ref 7–25)
CO2: 25 mmol/L (ref 20–32)
Calcium: 9.8 mg/dL (ref 8.6–10.4)
Chloride: 106 mmol/L (ref 98–110)
Creat: 0.73 mg/dL (ref 0.60–1.00)
Globulin: 2.8 g/dL (calc) (ref 1.9–3.7)
Glucose, Bld: 101 mg/dL — ABNORMAL HIGH (ref 65–99)
Potassium: 4.1 mmol/L (ref 3.5–5.3)
Sodium: 141 mmol/L (ref 135–146)
Total Bilirubin: 0.7 mg/dL (ref 0.2–1.2)
Total Protein: 6.8 g/dL (ref 6.1–8.1)
eGFR: 86 mL/min/{1.73_m2} (ref >=60)

## 2021-06-23 LAB — IRON,TIBC AND FERRITIN PANEL
%SAT: 34 % (calc) (ref 16–45)
Ferritin: 28 ng/mL (ref 16–288)
Iron: 135 ug/dL (ref 45–160)
TIBC: 397 mcg/dL (calc) (ref 250–450)

## 2021-06-23 LAB — CBC WITH DIFFERENTIAL/PLATELET
Absolute Monocytes: 697 cells/uL (ref 200–950)
Basophils Absolute: 42 cells/uL (ref 0–200)
Basophils Relative: 0.5 %
Eosinophils Absolute: 168 cells/uL (ref 15–500)
Eosinophils Relative: 2 %
HCT: 39.7 % (ref 35.0–45.0)
Hemoglobin: 13.3 g/dL (ref 11.7–15.5)
Lymphs Abs: 3511 cells/uL (ref 850–3900)
MCH: 31.4 pg (ref 27.0–33.0)
MCHC: 33.5 g/dL (ref 32.0–36.0)
MCV: 93.6 fL (ref 80.0–100.0)
MPV: 12 fL (ref 7.5–12.5)
Monocytes Relative: 8.3 %
Neutro Abs: 3982 cells/uL (ref 1500–7800)
Neutrophils Relative %: 47.4 %
Platelets: 235 10*3/uL (ref 140–400)
RBC: 4.24 10*6/uL (ref 3.80–5.10)
RDW: 13.3 % (ref 11.0–15.0)
Total Lymphocyte: 41.8 %
WBC: 8.4 10*3/uL (ref 3.8–10.8)

## 2021-06-23 LAB — HEPATITIS C ANTIBODY
Hepatitis C Ab: NONREACTIVE
SIGNAL TO CUT-OFF: 0.1 (ref <1.00)

## 2021-06-23 LAB — HEPATITIS A ANTIBODY, TOTAL: Hepatitis A AB,Total: REACTIVE — AB

## 2021-06-23 LAB — HEPATITIS B CORE ANTIBODY, TOTAL: Hep B Core Total Ab: NONREACTIVE

## 2021-06-23 LAB — HEPATITIS B SURFACE ANTIGEN: Hepatitis B Surface Ag: NONREACTIVE

## 2021-06-23 LAB — HEPATITIS B SURFACE ANTIBODY,QUALITATIVE: Hep B S Ab: NONREACTIVE

## 2021-06-24 ENCOUNTER — Ambulatory Visit: Payer: Medicare PPO | Admitting: Cardiovascular Disease

## 2021-06-24 ENCOUNTER — Encounter: Payer: Self-pay | Admitting: Cardiovascular Disease

## 2021-06-24 ENCOUNTER — Other Ambulatory Visit (HOSPITAL_COMMUNITY)
Admission: RE | Admit: 2021-06-24 | Discharge: 2021-06-24 | Disposition: A | Discharge status: Home / Self Care | Payer: Medicare PPO | Source: Ambulatory Visit | Attending: Cardiovascular Disease | Admitting: Cardiovascular Disease

## 2021-06-24 VITALS — BP 152/74 | HR 99 | Ht 61.0 in | Wt 163.0 lb

## 2021-06-24 DIAGNOSIS — R079 Chest pain, unspecified: Secondary | ICD-10-CM | POA: Diagnosis not present

## 2021-06-24 DIAGNOSIS — R5383 Other fatigue: Secondary | ICD-10-CM | POA: Insufficient documentation

## 2021-06-24 DIAGNOSIS — R0602 Shortness of breath: Secondary | ICD-10-CM

## 2021-06-24 DIAGNOSIS — I10 Essential (primary) hypertension: Secondary | ICD-10-CM | POA: Diagnosis not present

## 2021-06-24 LAB — BRAIN NATRIURETIC PEPTIDE: B Natriuretic Peptide: 39 pg/mL (ref 0.0–100.0)

## 2021-06-24 LAB — TSH: TSH: 0.856 u[IU]/mL (ref 0.350–4.500)

## 2021-06-24 NOTE — Progress Notes (Signed)
.. Cardiology Office Note:   Date:  06/24/2021  NAME:  Connie Kent    MRN: [id number] DOB:  17-Oct-1945   PCP:  Monico Blitz, MD  Cardiologist:  None  Electrophysiologist:  None   Referring MD: Monico Blitz, MD   Chief Complaint  Patient presents with   Chest Pain   History of Present Illness:   Connie Kent is a 76 y.o. female with a hx of GERD who is being seen today for the evaluation of chest pain at the request of Monico Blitz, MD. she reports she gets short of breath with exertion.  She reports this has been going on for years.  She also reports sharp chest pain.  Can occur in the center of the right side of her chest.  Also can occur in the left side of the chest.  She reports symptoms are sharp.  Can last several minutes and go away.  She reports no identifiable trigger.  She reports stress may worsen this.  She does have known acid reflux disease.  She is going to undergo a gastric emptying study later this week.  She reports she has now been diagnosed with diabetes.  She also has hypertension hyperlipidemia.  Blood pressure is elevated today but she took her medication.  Cardiovascular examination is normal.  EKG demonstrates sinus rhythm with no acute ischemic changes or evidence of infarction.  She reports she does not smoke but recently picked up cigar use.  No alcohol or drug use is reported.  She has never had a heart attack or stroke.  Her father and mother had heart disease.  She has 4 children.  No recent thyroid test.  Recent lab work shows she is not anemic.  Symptoms do not appear to be exertional regarding her chest discomfort.  Her shortness of breath is.  She does struggle with abdominal pain.  She has elevated liver enzymes and is being worked up by gastroenterology.  No cirrhosis on CT scan from last year.  She is going to undergo gastric emptying study.  Past Medical History: Past Medical History:  Diagnosis Date   Diabetes mellitus without complication (HCC)     GERD (gastroesophageal reflux disease)    H. pylori infection    Confirmed eradication on EGD in September 2022   Hyperlipidemia    Hypertension    Thyroid disease     Past Surgical History: Past Surgical History:  Procedure Laterality Date   ABDOMINAL HYSTERECTOMY     BIOPSY  09/23/2020   Procedure: BIOPSY;  Surgeon: Daneil Dolin, MD;  [facility];  Service: Endoscopy;;   breast biopsies     BREAST BIOPSY Bilateral    [place]   CHOLECYSTECTOMY     COLONOSCOPY  06/02/1999   Dr. Gala Romney; internal and external hemorrhoids, poor prep.   COLONOSCOPY  09/08/2002   Dr. Gala Romney; friable internal hemorrhoids, scattered left-sided diverticula, otherwise normal colon and TI.   ESOPHAGOGASTRODUODENOSCOPY  2003   Dr. Gala Romney; normal esophagus s/p empiric dilation, superficial antral erosions.   ESOPHAGOGASTRODUODENOSCOPY  09/08/2002   Small hiatal hernia, otherwise normal.   ESOPHAGOGASTRODUODENOSCOPY  08/22/2007   Dr. Gala Romney; normal esophagus s/p empiric dilation, small hiatal hernia, benign-appearing polypoid antral mucosa, otherwise normal.   ESOPHAGOGASTRODUODENOSCOPY (EGD) WITH PROPOFOL N/A 09/23/2020   Procedure: ESOPHAGOGASTRODUODENOSCOPY (EGD) WITH PROPOFOL;  Surgeon: Daneil Dolin, MD;  [facility]: [facility];  Service: Endoscopy;  Laterality: N/A;  7:30am   EYE SURGERY     HEMORRHOID  SURGERY     MALONEY DILATION N/A 09/23/2020   Procedure: Venia Minks DILATION;  Surgeon: Daneil Dolin, MD;  Location: [facility];  Service: Endoscopy;  Laterality: N/A;   THROAT SURGERY     Biopsy    Current Medications: Current Meds  Medication Sig   amLODipine (NORVASC) 2.5 MG tablet Take 2.5 mg by mouth daily.   baclofen (LIORESAL) 10 MG tablet Take 10 mg by mouth at bedtime.   FLUoxetine (PROZAC) 20 MG capsule Take 20 mg by mouth daily.   hydrochlorothiazide (HYDRODIURIL) 25 MG tablet Take by mouth daily.   HYDROcodone-acetaminophen (NORCO/VICODIN) 5-325 MG tablet Take 0.5-1  tablets by mouth 2 (two) times daily as needed for severe pain.   hydrocortisone (ANUSOL-HC) 2.5 % rectal cream Place 1 application. rectally 2 (two) times daily.   levothyroxine (SYNTHROID, LEVOTHROID) 50 MCG tablet Take 1 tablet (50 mcg total) by mouth daily. (Patient taking differently: Take 50 mcg by mouth daily before breakfast.)   meclizine (ANTIVERT) 25 MG tablet Take 50 mg by mouth every morning.   metoprolol tartrate (LOPRESSOR) 25 MG tablet Take 25 mg by mouth daily.   mirabegron ER (MYRBETRIQ) 25 MG TB24 tablet Take 1 tablet (25 mg total) by mouth daily.   Omega-3 Fatty Acids (FISH OIL) 1000 MG CAPS Take 1,000 mg by mouth daily.   ondansetron (ZOFRAN) 4 MG tablet Take 1 tablet (4 mg total) by mouth every 8 (eight) hours as needed for nausea or vomiting.   pantoprazole (PROTONIX) 40 MG tablet Take 40 mg by mouth daily.   predniSONE (STERAPRED UNI-PAK 21 TAB) 5 MG (21) TBPK tablet Take 5 mg by mouth. One daily   Vibegron (GEMTESA) 75 MG TABS Take 1 capsule by mouth daily.   VITAMIN D PO Take 1 tablet by mouth daily.   vitamin E 100 UNIT capsule Take 100 Units by mouth daily.     Allergies:    Cyclobenzaprine and Tramadol   Social History: Social History   Socioeconomic History   Marital status: Married    Spouse name: Not on file   Number of children: 4   Years of education: Not on file   Highest education level: Not on file  Occupational History   Occupation: Retired  Tobacco Use   Smoking status: Former    Types: Cigarettes    Passive exposure: Past   Smokeless tobacco: Never  Scientific laboratory technician Use: Never used  Substance and Sexual Activity   Alcohol use: No   Drug use: No   Sexual activity: Not Currently  Other Topics Concern   Not on file  Social History Narrative   Not on file   Social Determinants of Health   Financial Resource Strain: Not on file  Food Insecurity: Not on file  Transportation Needs: Not on file  Physical Activity: Not on file   Stress: Not on file  Social Connections: Not on file     Family History: The patient's family history includes Cancer in her mother; Heart failure in her father.  ROS:   All other ROS reviewed and negative. Pertinent positives noted in the HPI.     EKGs/Labs/Other Studies Reviewed:   The following studies were personally reviewed by me today:  EKG:  EKG is ordered today.  The ekg ordered today demonstrates normal sinus rhythm heart rate 99, no acute ischemic changes or evidence of infarction, and was personally reviewed by me.   Recent Labs: 06/20/2021: ALT 29; BUN 14; Creat 0.73;  Hemoglobin 13.3; Platelets 235; Potassium 4.1; Sodium 141   Recent Lipid Panel    Component Value Date/Time   CHOL 186 07/26/2013 1120   TRIG 289 (H) 07/26/2013 1120   HDL 42 07/26/2013 1120   CHOLHDL 4.4 07/26/2013 1120   CHOLHDL 3.9 03/25/2012 0512   VLDL 34 03/25/2012 0512   LDLCALC 86 07/26/2013 1120    Physical Exam:   VS:  BP (!) 152/74   Pulse 99   Ht '5\' 1"'$  (1.549 m)   Wt 163 lb (73.9 kg)   BMI 30.80 kg/m    Wt Readings from Last 3 Encounters:  06/24/21 163 lb (73.9 kg)  05/28/21 164 lb 3.2 oz (74.5 kg)  09/23/20 170 lb (77.1 kg)    General: Well nourished, well developed, in no acute distress Head: Atraumatic, normal size  Eyes: PEERLA, EOMI  Neck: Supple, no JVD Endocrine: No thryomegaly Cardiac: Normal S1, S2; RRR; no murmurs, rubs, or gallops Lungs: Clear to auscultation bilaterally, no wheezing, rhonchi or rales  Abd: Soft, nontender, no hepatomegaly  Ext: No edema, pulses 2+ Musculoskeletal: No deformities, BUE and BLE strength normal and equal Skin: Warm and dry, no rashes   Neuro: Alert and oriented to person, place, time, and situation, CNII-XII grossly intact, no focal deficits  Psych: Normal mood and affect   ASSESSMENT:   CHARM STENNER is a 76 y.o. female who presents for the following: 1. Chest pain, unspecified type   2. SOB (shortness of breath)   3.  Primary hypertension     PLAN:   1. Chest pain, unspecified type -She reports sharp chest pain that can occur at any time.  Can occur with exertion but can occur at rest.  EKG with no acute ischemic changes.  Her heart rate is noticeably in the high 90s. -Overall symptoms could be stress related.  They do not sound cardiac to me.  We will proceed with an echo and nuclear medicine stress test to exclude any abnormality. -I would like to check a TSH as well as BNP. -She should continue to be worked up for possible liver etiology.  Her labs are notable for hep a positivity.  This needs to be worked up further.  I recommended she reach back out to GI.  2. SOB (shortness of breath) -Shortness of breath with exertion.  EKG unremarkable.  Exam normal.  Check TSH, BNP.  We will check an echo as well as stress test.  Overall low suspicion for cardiac etiology.  3. Primary hypertension -BP elevated today.  Should continue home medications.  Shared Decision Making/Informed Consent The risks [chest pain, shortness of breath, cardiac arrhythmias, dizziness, blood pressure fluctuations, myocardial infarction, stroke/transient ischemic attack, nausea, vomiting, allergic reaction, radiation exposure, metallic taste sensation and life-threatening complications (estimated to be 1 in 10,000)], benefits (risk stratification, diagnosing coronary artery disease, treatment guidance) and alternatives of a nuclear stress test were discussed in detail with Ms. Bessey and she agrees to proceed.  Disposition: Return if symptoms worsen or fail to improve.  Medication Adjustments/Labs and Tests Ordered: Current medicines are reviewed at length with the patient today.  Concerns regarding medicines are outlined above.  Orders Placed This Encounter  Procedures   NM Myocar Multi W/Spect W/Wall Motion / EF   TSH   B Nat Peptide   Cardiac Stress Test: Informed Consent Details: Physician/Practitioner Attestation; Transcribe  to consent form and obtain patient signature   EKG 12-Lead   ECHOCARDIOGRAM COMPLETE   No orders of  the defined types were placed in this encounter.   Patient Instructions  Medication Instructions:  Your physician recommends that you continue on your current medications as directed. Please refer to the Current Medication list given to you today.   Labwork: TSH , BNP Today  Testing/Procedures: Your physician has requested that you have an echocardiogram. Echocardiography is a painless test that uses sound waves to create images of your heart. It provides your doctor with information about the size and shape of your heart and how well your heart's chambers and valves are working. This procedure takes approximately one hour. There are no restrictions for this procedure.  Your physician has requested that you have a lexiscan myoview. For further information please visit [email or web address]. Please follow instruction sheet, as given.  Follow-Up: As needed  Any Other Special Instructions Will Be Listed Below (If Applicable).  If you need a refill on your cardiac medications before your next appointment, please call your pharmacy.    Signed, Addison Naegeli. Audie Box, MD, [facility]  [address], [place], [place] [address] [phone]  06/24/2021 1:44 PM

## 2021-06-24 NOTE — Patient Instructions (Addendum)
Medication Instructions:  Your physician recommends that you continue on your current medications as directed. Please refer to the Current Medication list given to you today.   Labwork: TSH , BNP Today  Testing/Procedures: Your physician has requested that you have an echocardiogram. Echocardiography is a painless test that uses sound waves to create images of your heart. It provides your doctor with information about the size and shape of your heart and how well your heart's chambers and valves are working. This procedure takes approximately one hour. There are no restrictions for this procedure.  Your physician has requested that you have a lexiscan myoview. For further information please visit [email or web address]. Please follow instruction sheet, as given.  Follow-Up: As needed  Any Other Special Instructions Will Be Listed Below (If Applicable).  If you need a refill on your cardiac medications before your next appointment, please call your pharmacy.

## 2021-06-25 ENCOUNTER — Telehealth: Payer: Self-pay | Admitting: *Deleted

## 2021-06-25 NOTE — Telephone Encounter (Signed)
Spoke to pt, informed her of results. Pt voiced understanding. Informed Cyril Mourning was out this week and would look at her labs when she returns and if there was a problem I would call her.

## 2021-06-25 NOTE — Telephone Encounter (Signed)
Spoke to pt, she wanted results of her blood work she did last week. Informed her that the provider had not reviewed them yet. Also, she stated she went to the cardiologist yesterday and he told her something was going on with her liver. I informed her that I would sent the provider a message to get more information.

## 2021-06-25 NOTE — Telephone Encounter (Signed)
AST is mildly elevated at 42 and improved from last drawn. No other abnormalities in LFTs. Immune to Hep A. She is not immune to Hep B. CBC normal.   I do not see anything concerning. Connie Kent will address further when she gets back. She has a known history of elevated LFTs, which are actually better than previously drawn.

## 2021-06-26 ENCOUNTER — Encounter (HOSPITAL_COMMUNITY): Payer: Medicare PPO

## 2021-06-27 NOTE — Telephone Encounter (Signed)
Please see lab results for further recommendations.

## 2021-06-30 DIAGNOSIS — R002 Palpitations: Secondary | ICD-10-CM | POA: Diagnosis not present

## 2021-06-30 NOTE — Telephone Encounter (Signed)
See previous note

## 2021-07-01 NOTE — Progress Notes (Signed)
TSH ordered due to shortness of breath and fatigue.   [place] T. Audie Box, MD, [facility]  [address] [place], [place] [address] [phone]  12:45 PM

## 2021-07-02 ENCOUNTER — Inpatient Hospital Stay (HOSPITAL_COMMUNITY): Admission: RE | Admit: 2021-07-02 | Payer: Medicare PPO | Source: Ambulatory Visit

## 2021-07-07 ENCOUNTER — Encounter (HOSPITAL_COMMUNITY): Payer: Medicare PPO

## 2021-07-13 ENCOUNTER — Other Ambulatory Visit: Payer: Self-pay | Admitting: Internal Medicine

## 2021-07-16 DIAGNOSIS — F339 Major depressive disorder, recurrent, unspecified: Secondary | ICD-10-CM | POA: Diagnosis not present

## 2021-07-16 DIAGNOSIS — Z299 Encounter for prophylactic measures, unspecified: Secondary | ICD-10-CM | POA: Diagnosis not present

## 2021-07-16 DIAGNOSIS — Z6830 Body mass index (BMI) 30.0-30.9, adult: Secondary | ICD-10-CM | POA: Diagnosis not present

## 2021-07-16 DIAGNOSIS — I471 Supraventricular tachycardia: Secondary | ICD-10-CM | POA: Diagnosis not present

## 2021-07-16 DIAGNOSIS — E039 Hypothyroidism, unspecified: Secondary | ICD-10-CM | POA: Diagnosis not present

## 2021-07-16 DIAGNOSIS — I10 Essential (primary) hypertension: Secondary | ICD-10-CM | POA: Diagnosis not present

## 2021-08-05 ENCOUNTER — Other Ambulatory Visit: Payer: Self-pay

## 2021-08-05 DIAGNOSIS — N289 Disorder of kidney and ureter, unspecified: Secondary | ICD-10-CM

## 2021-08-20 ENCOUNTER — Ambulatory Visit: Payer: Medicare PPO | Admitting: Cardiovascular Disease

## 2021-08-22 DIAGNOSIS — I10 Essential (primary) hypertension: Secondary | ICD-10-CM | POA: Diagnosis not present

## 2021-08-22 DIAGNOSIS — Z0000 Encounter for general adult medical examination without abnormal findings: Secondary | ICD-10-CM | POA: Diagnosis not present

## 2021-08-22 DIAGNOSIS — Z299 Encounter for prophylactic measures, unspecified: Secondary | ICD-10-CM | POA: Diagnosis not present

## 2021-08-22 DIAGNOSIS — Z6830 Body mass index (BMI) 30.0-30.9, adult: Secondary | ICD-10-CM | POA: Diagnosis not present

## 2021-08-22 DIAGNOSIS — I700 Atherosclerosis of aorta: Secondary | ICD-10-CM | POA: Diagnosis not present

## 2021-09-09 ENCOUNTER — Telehealth: Payer: Self-pay | Admitting: *Deleted

## 2021-09-09 NOTE — Telephone Encounter (Signed)
Spoke with patient.  These are the same symptoms that has had for quite some time.  I saw her in May for the same symptoms.  No significant change.  Reflux is well controlled on pantoprazole 40 mg daily.  Reports she stopped taking BC powders, but did take 1 a couple days ago.  Advised that she discontinue these completely and avoid all NSAIDs.  Explained that unfortunately, without further evaluation, I have limited ability to provide additional recommendations for management of her symptoms.  I ordered CT angio A/P and gastric emptying study at her last office visit which was not completed.  She does not need any procedures.  I discussed with her that we need to get imaging studies completed as soon as possible to help identify the cause of her pain.  She is agreeable to this.  She also saw cardiology who ordered a nuclear stress test and she has not had this completed yet either.  Recommended she call her cardiologist to get this rescheduled.   Mindy/Tammy:  We need to get patient rescheduled for CT angio and GES that was ordered back in May.

## 2021-09-09 NOTE — Telephone Encounter (Signed)
Pt called stating she has pain on her left side under her ribs. Also states her stomach is still hurting.  She states she can't afford to have procedure done.  Please advise.

## 2021-09-10 DIAGNOSIS — E1165 Type 2 diabetes mellitus with hyperglycemia: Secondary | ICD-10-CM | POA: Diagnosis not present

## 2021-09-10 NOTE — Telephone Encounter (Signed)
PA approved via [employer] for CTA A/P. Auth# [id number], DOS: Sep 10 2021 - Oct 10 2021 No PA required GES.   Called GES scheduled for 9/7, arrival 8:45am, npo midnight, no stomach meds prior. CTA scheduled for same day after GES. Per Radiology this was fine to do.   Called pt and made aware of appt details. She voiced understanding. Aware it is important to show up for these appointments.

## 2021-09-10 NOTE — Addendum Note (Signed)
Addended by: Cheron Every on: 09/10/2021 09:36 AM   Modules accepted: Orders

## 2021-09-16 ENCOUNTER — Ambulatory Visit (HOSPITAL_COMMUNITY): Admission: RE | Admit: 2021-09-16 | Payer: Medicare PPO | Source: Ambulatory Visit

## 2021-09-18 ENCOUNTER — Ambulatory Visit (HOSPITAL_COMMUNITY)
Admission: RE | Admit: 2021-09-18 | Discharge: 2021-09-18 | Disposition: A | Discharge status: Home / Self Care | Payer: Medicare PPO | Source: Ambulatory Visit | Attending: Gastroenterology | Admitting: Gastroenterology

## 2021-09-18 ENCOUNTER — Other Ambulatory Visit (HOSPITAL_COMMUNITY): Payer: Medicare PPO

## 2021-09-18 DIAGNOSIS — I700 Atherosclerosis of aorta: Secondary | ICD-10-CM | POA: Diagnosis not present

## 2021-09-18 DIAGNOSIS — R110 Nausea: Secondary | ICD-10-CM | POA: Insufficient documentation

## 2021-09-18 DIAGNOSIS — R1033 Periumbilical pain: Secondary | ICD-10-CM | POA: Diagnosis not present

## 2021-09-18 LAB — POCT I-STAT CREATININE: Creatinine, Ser: 1 mg/dL (ref 0.44–1.00)

## 2021-09-18 MED ORDER — IOHEXOL 350 MG/ML SOLN
100.0000 mL | Freq: Once | INTRAVENOUS | Status: AC | PRN
  Administered 2021-09-18: 100 mL via INTRAVENOUS

## 2021-09-23 ENCOUNTER — Encounter (HOSPITAL_COMMUNITY): Payer: Medicare PPO

## 2021-09-23 ENCOUNTER — Other Ambulatory Visit: Payer: Medicare PPO

## 2021-09-24 ENCOUNTER — Telehealth: Payer: Self-pay | Admitting: *Deleted

## 2021-09-24 ENCOUNTER — Telehealth: Payer: Self-pay | Admitting: Internal Medicine

## 2021-09-24 DIAGNOSIS — Z6830 Body mass index (BMI) 30.0-30.9, adult: Secondary | ICD-10-CM | POA: Diagnosis not present

## 2021-09-24 DIAGNOSIS — E1165 Type 2 diabetes mellitus with hyperglycemia: Secondary | ICD-10-CM | POA: Diagnosis not present

## 2021-09-24 DIAGNOSIS — Z2821 Immunization not carried out because of patient refusal: Secondary | ICD-10-CM | POA: Diagnosis not present

## 2021-09-24 DIAGNOSIS — I10 Essential (primary) hypertension: Secondary | ICD-10-CM | POA: Diagnosis not present

## 2021-09-24 DIAGNOSIS — Z299 Encounter for prophylactic measures, unspecified: Secondary | ICD-10-CM | POA: Diagnosis not present

## 2021-09-24 NOTE — Telephone Encounter (Signed)
She previous note. I spoke to pt.

## 2021-09-24 NOTE — Telephone Encounter (Signed)
Pt LMOM that she was returning a call. I have made her an OV with [facility] per [facility]. Please call [phone]

## 2021-09-24 NOTE — Telephone Encounter (Signed)
Spoke to pt, informed her of recommendations. Pt voiced understanding. Pt states her husband fell and broke his wrist and she has been taking care of him. That is why she cancelled GES. States she will call tomorrow and reschedule. Provided the number for her to call Central Scheduling.

## 2021-09-24 NOTE — Telephone Encounter (Signed)
Noted.   Connie Kent, can you call patient and see why she has cancelled her GES again? When she is able, she will need to call Central Scheduling [phone] and get this rescheduled.   We can go ahead and get a follow-up scheduled in 4-6 weeks as we also need to follow-up on cirrhosis. I would like for her to see Dr. Gala Romney.   [name]: Please arrange follow-up with Dr. Gala Romney in the next 4-6 weeks.

## 2021-09-24 NOTE — Telephone Encounter (Signed)
Noted. Thanks.

## 2021-09-24 NOTE — Telephone Encounter (Signed)
FYI to kristen patient has cancelled GES x 5.

## 2021-09-25 ENCOUNTER — Encounter (HOSPITAL_COMMUNITY): Payer: Medicare PPO

## 2021-09-29 ENCOUNTER — Other Ambulatory Visit (HOSPITAL_COMMUNITY): Payer: Medicare PPO

## 2021-10-08 NOTE — Telephone Encounter (Signed)
Already scheduled 10/10

## 2021-10-10 DIAGNOSIS — E1165 Type 2 diabetes mellitus with hyperglycemia: Secondary | ICD-10-CM | POA: Diagnosis not present

## 2021-10-14 ENCOUNTER — Other Ambulatory Visit: Payer: Medicare PPO

## 2021-10-15 ENCOUNTER — Ambulatory Visit: Payer: Medicare PPO | Attending: Cardiovascular Disease

## 2021-10-15 DIAGNOSIS — R0602 Shortness of breath: Secondary | ICD-10-CM | POA: Diagnosis not present

## 2021-10-15 LAB — ECHOCARDIOGRAM COMPLETE
AR max vel: 1.61 cm2
AV Peak grad: 7.2 mmHg
Ao pk vel: 1.34 m/s
Area-P 1/2: 3.27 cm2
Calc EF: 59.7 %
MV M vel: 3.91 m/s
MV Peak grad: 61.2 mmHg
S' Lateral: 3.1 cm
Single Plane A2C EF: 59.7 %
Single Plane A4C EF: 61.7 %

## 2021-10-16 DIAGNOSIS — Z299 Encounter for prophylactic measures, unspecified: Secondary | ICD-10-CM | POA: Diagnosis not present

## 2021-10-16 DIAGNOSIS — I10 Essential (primary) hypertension: Secondary | ICD-10-CM | POA: Diagnosis not present

## 2021-10-16 DIAGNOSIS — Z710 Person encountering health services to consult on behalf of another person: Secondary | ICD-10-CM | POA: Diagnosis not present

## 2021-10-21 ENCOUNTER — Ambulatory Visit: Payer: Medicare PPO | Admitting: Internal Medicine

## 2021-10-24 ENCOUNTER — Telehealth: Payer: Self-pay | Admitting: Cardiovascular Disease

## 2021-10-24 NOTE — Telephone Encounter (Signed)
Spoke with pt regarding the results of her echo. Results given to pt. Pt verbalizes understanding.

## 2021-10-24 NOTE — Telephone Encounter (Signed)
Patient is calling for her Echo results.

## 2021-10-28 ENCOUNTER — Ambulatory Visit: Payer: Medicare PPO | Admitting: Internal Medicine

## 2021-11-10 DIAGNOSIS — E1165 Type 2 diabetes mellitus with hyperglycemia: Secondary | ICD-10-CM | POA: Diagnosis not present

## 2021-11-11 ENCOUNTER — Telehealth: Payer: Self-pay | Admitting: *Deleted

## 2021-11-11 ENCOUNTER — Encounter: Payer: Self-pay | Admitting: Internal Medicine

## 2021-11-11 ENCOUNTER — Ambulatory Visit (INDEPENDENT_AMBULATORY_CARE_PROVIDER_SITE_OTHER): Payer: Medicare PPO | Admitting: Internal Medicine

## 2021-11-11 VITALS — BP 172/81 | HR 86 | Temp 97.5°F | Ht 61.5 in | Wt 153.6 lb

## 2021-11-11 DIAGNOSIS — R7989 Other specified abnormal findings of blood chemistry: Secondary | ICD-10-CM

## 2021-11-11 DIAGNOSIS — R6881 Early satiety: Secondary | ICD-10-CM

## 2021-11-11 NOTE — Patient Instructions (Signed)
Good to see you again today!  Hepatic function profile today  Obtain hepatitis B vaccination  Solid phase gastric emptying study to further evaluate early satiety and weight loss in a diabetic  Follow-up with Dr. Alyson Ingles  Once gastric emptying study is back for review, we will decide about timing of a colonoscopy which is recommended because of a change in bowel habits and rectal bleeding  Further recommendations to follow.

## 2021-11-11 NOTE — Telephone Encounter (Signed)
Per nuc med patient has had several no shows. They have rescheduled her this one last time. If she no shows she will not be able to be rescheduled for this appointment. I have made Dr. Gala Romney aware.   Called pt home # VM not activated, called cell# LMOVM to call back to give GES appt for 11/7, arrival 7:45am, npo midnight, no stomach meds after midnight. Test can take up to 4 hrs. Need to remind her she needs to show up for this appt.

## 2021-11-11 NOTE — Progress Notes (Signed)
Primary Care Physician:  Monico Blitz, MD Primary Gastroenterologist:  Dr. Gala Romney  Pre-Procedure History & Physical: HPI:  Connie Kent is a 76 y.o. female here for follow-up GERD.  Nonspecific abdominal pain - sometimes postprandial,  sometimes not;  complains of left lower rib cage pain from time to time. Pain worse standing a little  - relieved with lying down.  States when she strains or picks up something heavy , she is incontinent of stool has had intermittent rectal bleeding.  History of mild transaminitis intermittantly.  Hep C antibody negative.  Iron studies negative.  Susceptible to B. CTA recently revealed wildly patent mesenteric vasculature.  Probable left renal cell carcinoma.  Changes of liver consistent with underlying cirrhosis,  no evidence of portal hypertension. No evidence of portal hypertension on EGD 1 year ago.  Dysphagia resolved  - status post dilation.  Last colonoscopy many years ago (2004-friable internal hemorrhoids and left-sided diverticula). . Weight down 11 pounds since her May visit here.  Sleeps poorly.  Has to take a sleep aid.  Very stressed taking care of of her husband who has Alzheimer's dementia.  Past Medical History:  Diagnosis Date   Diabetes mellitus without complication (HCC)    GERD (gastroesophageal reflux disease)    H. pylori infection    Confirmed eradication on EGD in September 2022   Hyperlipidemia    Hypertension    Thyroid disease     Past Surgical History:  Procedure Laterality Date   ABDOMINAL HYSTERECTOMY     BIOPSY  09/23/2020   Procedure: BIOPSY;  Surgeon: Daneil Dolin, MD;  [facility];  Service: Endoscopy;;   breast biopsies     BREAST BIOPSY Bilateral    beghin   CHOLECYSTECTOMY     COLONOSCOPY  06/02/1999   Dr. Gala Romney; internal and external hemorrhoids, poor prep.   COLONOSCOPY  09/08/2002   Dr. Gala Romney; friable internal hemorrhoids, scattered left-sided diverticula, otherwise normal colon and TI.    ESOPHAGOGASTRODUODENOSCOPY  2003   Dr. Gala Romney; normal esophagus s/p empiric dilation, superficial antral erosions.   ESOPHAGOGASTRODUODENOSCOPY  09/08/2002   Small hiatal hernia, otherwise normal.   ESOPHAGOGASTRODUODENOSCOPY  08/22/2007   Dr. Gala Romney; normal esophagus s/p empiric dilation, small hiatal hernia, benign-appearing polypoid antral mucosa, otherwise normal.   ESOPHAGOGASTRODUODENOSCOPY (EGD) WITH PROPOFOL N/A 09/23/2020   Procedure: ESOPHAGOGASTRODUODENOSCOPY (EGD) WITH PROPOFOL;  Surgeon: Daneil Dolin, MD;  [facility];  Service: Endoscopy;  Laterality: N/A;  7:30am   EYE SURGERY     HEMORRHOID SURGERY     MALONEY DILATION N/A 09/23/2020   Procedure: Venia Minks DILATION;  Surgeon: Daneil Dolin, MD;  Location: [facility];  Service: Endoscopy;  Laterality: N/A;   THROAT SURGERY     Biopsy    Prior to Admission medications   Medication Sig Start Date End Date Taking? Authorizing Provider  amLODipine (NORVASC) 2.5 MG tablet Take 2.5 mg by mouth daily. 07/06/20  Yes [provider]  baclofen (LIORESAL) 10 MG tablet Take 10 mg by mouth at bedtime. 05/04/20  Yes [provider]  FLUoxetine (PROZAC) 20 MG capsule Take 20 mg by mouth daily. 05/04/20  Yes [provider]  hydrochlorothiazide (HYDRODIURIL) 25 MG tablet Take by mouth daily. 03/26/21  Yes [provider]  HYDROcodone-acetaminophen (NORCO/VICODIN) 5-325 MG tablet Take 0.5-1 tablets by mouth 2 (two) times daily as needed for severe pain. 04/19/20  Yes [provider]  hydrocortisone (ANUSOL-HC) 2.5 % rectal cream Place 1  application. rectally 2 (two) times daily. 06/17/21  Yes Erenest Rasher, PA-C  levothyroxine (SYNTHROID, LEVOTHROID) 50 MCG tablet Take 1 tablet (50 mcg total) by mouth daily. Patient taking differently: Take 50 mcg by mouth daily before breakfast. 01/31/15  Yes Dettinger, Fransisca Kaufmann, MD  meclizine (ANTIVERT) 25 MG tablet Take 50 mg by mouth every  morning. 05/04/20  Yes [provider]  metoprolol tartrate (LOPRESSOR) 25 MG tablet Take 25 mg by mouth daily. 06/04/16  Yes [provider]  mirabegron ER (MYRBETRIQ) 25 MG TB24 tablet Take 1 tablet (25 mg total) by mouth daily. 09/20/20  Yes McKenzie, Candee Furbish, MD  Omega-3 Fatty Acids (FISH OIL) 1000 MG CAPS Take 1,000 mg by mouth daily.   Yes [provider]  ondansetron (ZOFRAN) 4 MG tablet Take 1 tablet (4 mg total) by mouth every 8 (eight) hours as needed for nausea or vomiting. 07/31/20  Yes Isla Pence, MD  pantoprazole (PROTONIX) 40 MG tablet Take 1 tablet by mouth once daily 07/14/21  Yes Jodi Mourning, Kristen S, PA-C  predniSONE (STERAPRED UNI-PAK 21 TAB) 5 MG (21) TBPK tablet Take 5 mg by mouth. One daily 09/13/20  Yes [provider]  Vibegron (GEMTESA) 75 MG TABS Take 1 capsule by mouth daily. 12/04/20  Yes McKenzie, Candee Furbish, MD  VITAMIN D PO Take 1 tablet by mouth daily.   Yes [provider]  vitamin E 100 UNIT capsule Take 100 Units by mouth daily.   Yes [provider]    Allergies as of 11/11/2021 - Review Complete 11/11/2021  Allergen Reaction Noted   Cyclobenzaprine  03/12/2016   Tramadol Other (See Comments) 03/12/2016    Family History  Problem Relation Age of Onset   Cancer Mother    Heart failure Father     Social History   Socioeconomic History   Marital status: Married    Spouse name: Not on file   Number of children: 4   Years of education: Not on file   Highest education level: Not on file  Occupational History   Occupation: Retired  Tobacco Use   Smoking status: Former    Types: Cigarettes    Passive exposure: Past   Smokeless tobacco: Never  Scientific laboratory technician Use: Never used  Substance and Sexual Activity   Alcohol use: No   Drug use: No   Sexual activity: Not Currently  Other Topics Concern   Not on file  Social History Narrative   Not on file   Social Determinants of Health    Financial Resource Strain: Not on file  Food Insecurity: Not on file  Transportation Needs: Not on file  Physical Activity: Not on file  Stress: Not on file  Social Connections: Not on file  Intimate Partner Violence: Not on file    Review of Systems: See HPI, otherwise negative ROS  Physical Exam: BP (!) 172/81 (BP Location: Right Arm, Patient Position: Sitting, Cuff Size: Normal)   Pulse 86   Temp (!) 97.5 F (36.4 C) (Oral)   Ht 5' 1.5" (1.562 m)   Wt 153 lb 9.6 oz (69.7 kg)   SpO2 98%   BMI 28.55 kg/m  General:   Alert, somewhat anxious appearing pleasant and cooperative in NAD Neck:  Supple; no masses or thyromegaly. No significant cervical adenopathy. Lungs:  Clear throughout to auscultation.   No wheezes, crackles, or rhonchi. No acute distress. Heart:  Regular rate and rhythm; no murmurs, clicks, rubs,  or gallops. Abdomen:  Non-distended, normal bowel sounds.  Soft and nontender without appreciable mass or hepatosplenomegaly.  Pulses:  Normal pulses noted. Extremities:  Without clubbing or edema.  Impression/Plan: 76 year old lady with somewhat nonspecific abdominal pain.  Some early satiety,  some weight loss.  Change in bowel habits with rectal bleeding as described.  Last colonoscopy almost 20 years ago.  GERD well-controlled.  No dysphagia.  Nodular liver on CT an incidental finding likely representative of longstanding metabolic liver disease.  She is well compensated and does not appear to have advanced disease i.e. portal hypertension, etc.  Quite a bit of situational stress at home taking care of her spouse with Alzheimer's.  Recommendations:  For now:  Hepatic function profile today  Obtain hepatitis B vaccination  Solid phase gastric emptying study to further evaluate early satiety and weight loss in a diabetic  Follow-up with Dr. Alyson Ingles  Once gastric emptying study is back for review, we will decide about timing of a colonoscopy which is  recommended because of a change in bowel habits and rectal bleeding  Further recommendations to follow.    Notice: This dictation was prepared with Dragon dictation along with smaller phrase technology. Any transcriptional errors that result from this process are unintentional and may not be corrected upon review.

## 2021-11-12 LAB — HEPATIC FUNCTION PANEL
AG Ratio: 1.4 (calc) (ref 1.0–2.5)
ALT: 15 U/L (ref 6–29)
AST: 24 U/L (ref 10–35)
Albumin: 4 g/dL (ref 3.6–5.1)
Alkaline phosphatase (APISO): 85 U/L (ref 37–153)
Bilirubin, Direct: 0.1 mg/dL (ref 0.0–0.2)
Globulin: 2.8 g/dL (calc) (ref 1.9–3.7)
Indirect Bilirubin: 0.5 mg/dL (calc) (ref 0.2–1.2)
Total Bilirubin: 0.6 mg/dL (ref 0.2–1.2)
Total Protein: 6.8 g/dL (ref 6.1–8.1)

## 2021-11-12 NOTE — Telephone Encounter (Signed)
Pt aware of appt details. She is also aware she needs to keep this appt or she will not be able to be rescheduled. She voiced understanding

## 2021-11-18 ENCOUNTER — Ambulatory Visit (HOSPITAL_COMMUNITY)
Admission: RE | Admit: 2021-11-18 | Discharge: 2021-11-18 | Disposition: A | Discharge status: Home / Self Care | Payer: Medicare PPO | Source: Ambulatory Visit | Attending: Urology | Admitting: Urology

## 2021-11-18 ENCOUNTER — Encounter (HOSPITAL_COMMUNITY): Payer: Self-pay

## 2021-11-18 ENCOUNTER — Encounter (HOSPITAL_COMMUNITY)
Admission: RE | Admit: 2021-11-18 | Discharge: 2021-11-18 | Disposition: A | Discharge status: Home / Self Care | Payer: Medicare PPO | Source: Ambulatory Visit | Attending: Internal Medicine | Admitting: Internal Medicine

## 2021-11-18 DIAGNOSIS — R6881 Early satiety: Secondary | ICD-10-CM

## 2021-11-18 DIAGNOSIS — N289 Disorder of kidney and ureter, unspecified: Secondary | ICD-10-CM | POA: Diagnosis not present

## 2021-11-18 DIAGNOSIS — E119 Type 2 diabetes mellitus without complications: Secondary | ICD-10-CM | POA: Insufficient documentation

## 2021-11-18 DIAGNOSIS — N200 Calculus of kidney: Secondary | ICD-10-CM | POA: Diagnosis not present

## 2021-11-18 DIAGNOSIS — N281 Cyst of kidney, acquired: Secondary | ICD-10-CM | POA: Insufficient documentation

## 2021-11-18 MED ORDER — TECHNETIUM TC 99M SULFUR COLLOID
2.0000 | Freq: Once | INTRAVENOUS | Status: AC | PRN
  Administered 2021-11-18: 2.2 via ORAL

## 2021-11-19 ENCOUNTER — Telehealth (INDEPENDENT_AMBULATORY_CARE_PROVIDER_SITE_OTHER): Payer: Self-pay | Admitting: Internal Medicine

## 2021-11-19 NOTE — Telephone Encounter (Signed)
Called pt, no answer and not able to leave VM

## 2021-11-19 NOTE — Telephone Encounter (Signed)
Patient returned your call - h# [phone]

## 2021-11-24 ENCOUNTER — Ambulatory Visit: Payer: Medicare PPO | Admitting: Urology

## 2021-11-24 DIAGNOSIS — J069 Acute upper respiratory infection, unspecified: Secondary | ICD-10-CM | POA: Diagnosis not present

## 2021-11-24 DIAGNOSIS — R059 Cough, unspecified: Secondary | ICD-10-CM | POA: Diagnosis not present

## 2021-11-24 DIAGNOSIS — Z299 Encounter for prophylactic measures, unspecified: Secondary | ICD-10-CM | POA: Diagnosis not present

## 2021-11-24 DIAGNOSIS — R062 Wheezing: Secondary | ICD-10-CM | POA: Diagnosis not present

## 2021-11-25 ENCOUNTER — Encounter: Payer: Self-pay | Admitting: *Deleted

## 2021-11-26 ENCOUNTER — Encounter: Payer: Self-pay | Admitting: Urology

## 2021-11-26 ENCOUNTER — Ambulatory Visit: Payer: Medicare PPO | Admitting: Urology

## 2021-11-26 VITALS — BP 176/74 | HR 80

## 2021-11-26 DIAGNOSIS — N3281 Overactive bladder: Secondary | ICD-10-CM

## 2021-11-26 DIAGNOSIS — N289 Disorder of kidney and ureter, unspecified: Secondary | ICD-10-CM | POA: Diagnosis not present

## 2021-11-26 NOTE — Patient Instructions (Signed)

## 2021-11-26 NOTE — Progress Notes (Signed)
11/26/2021 10:48 AM   Connie Kent 1945/05/19 [id number]  Referring provider: Monico Blitz, MD [address] [place],  [place] [id number]  Followup left renal mass and OAB   HPI: Connie Kent is a 76yo here for followup for a left renal lesion and OAB. Renal US 11/18/2021 shows a stable left renal mass. She is currently on mirabegron '25mg'$  as needed. She feels it improves her nocturia when she remembers to take the medication. She previously tried gemtesa which failed to improve her urinary urgency and frequency. She uses 4-5 pads per day.    PMH: Past Medical History:  Diagnosis Date   Diabetes mellitus without complication (HCC)    GERD (gastroesophageal reflux disease)    H. pylori infection    Confirmed eradication on EGD in September 2022   Hyperlipidemia    Hypertension    Thyroid disease     Surgical History: Past Surgical History:  Procedure Laterality Date   ABDOMINAL HYSTERECTOMY     BIOPSY  09/23/2020   Procedure: BIOPSY;  Surgeon: Daneil Dolin, MD;  [facility];  Service: Endoscopy;;   breast biopsies     BREAST BIOPSY Bilateral    beghin   CHOLECYSTECTOMY     COLONOSCOPY  06/02/1999   Dr. Gala Romney; internal and external hemorrhoids, poor prep.   COLONOSCOPY  09/08/2002   Dr. Gala Romney; friable internal hemorrhoids, scattered left-sided diverticula, otherwise normal colon and TI.   ESOPHAGOGASTRODUODENOSCOPY  2003   Dr. Gala Romney; normal esophagus s/p empiric dilation, superficial antral erosions.   ESOPHAGOGASTRODUODENOSCOPY  09/08/2002   Small hiatal hernia, otherwise normal.   ESOPHAGOGASTRODUODENOSCOPY  08/22/2007   Dr. Gala Romney; normal esophagus s/p empiric dilation, small hiatal hernia, benign-appearing polypoid antral mucosa, otherwise normal.   ESOPHAGOGASTRODUODENOSCOPY (EGD) WITH PROPOFOL N/A 09/23/2020   Procedure: ESOPHAGOGASTRODUODENOSCOPY (EGD) WITH PROPOFOL;  Surgeon: Daneil Dolin, MD;  [facility];  Service: Endoscopy;  Laterality:  N/A;  7:30am   EYE SURGERY     HEMORRHOID SURGERY     MALONEY DILATION N/A 09/23/2020   Procedure: Venia Minks DILATION;  Surgeon: Daneil Dolin, MD;  [facility];  Service: Endoscopy;  Laterality: N/A;   THROAT SURGERY     Biopsy    Home Medications:  Allergies as of 11/26/2021       Reactions   Cyclobenzaprine    sick   Tramadol Other (See Comments)   sick        Medication List        Accurate as of November 26, 2021 10:48 AM. If you have any questions, ask your nurse or doctor.          amLODipine 2.5 MG tablet Commonly known as: NORVASC Take 2.5 mg by mouth daily.   baclofen 10 MG tablet Commonly known as: LIORESAL Take 10 mg by mouth at bedtime.   Fish Oil 1000 MG Caps Take 1,000 mg by mouth daily.   FLUoxetine 20 MG capsule Commonly known as: PROZAC Take 20 mg by mouth daily.   Gemtesa 75 MG Tabs Generic drug: Vibegron Take 1 capsule by mouth daily.   hydrochlorothiazide 25 MG tablet Commonly known as: HYDRODIURIL Take by mouth daily.   HYDROcodone-acetaminophen 5-325 MG tablet Commonly known as: NORCO/VICODIN Take 0.5-1 tablets by mouth 2 (two) times daily as needed for severe pain.   hydrocortisone 2.5 % rectal cream Commonly known as: Anusol-HC Place 1 application. rectally 2 (two) times daily.   levothyroxine 50 MCG tablet Commonly known as:  SYNTHROID Take 1 tablet (50 mcg total) by mouth daily. What changed: when to take this   meclizine 25 MG tablet Commonly known as: ANTIVERT Take 50 mg by mouth every morning.   metoprolol tartrate 25 MG tablet Commonly known as: LOPRESSOR Take 25 mg by mouth daily.   mirabegron ER 25 MG Tb24 tablet Commonly known as: MYRBETRIQ Take 1 tablet (25 mg total) by mouth daily.   ondansetron 4 MG tablet Commonly known as: ZOFRAN Take 1 tablet (4 mg total) by mouth every 8 (eight) hours as needed for nausea or vomiting.   pantoprazole 40 MG tablet Commonly known as: PROTONIX Take 1  tablet by mouth once daily   predniSONE 5 MG (21) Tbpk tablet Commonly known as: STERAPRED UNI-PAK 21 TAB Take 5 mg by mouth. One daily   VITAMIN D PO Take 1 tablet by mouth daily.   vitamin E 45 MG (100 UNITS) capsule Take 100 Units by mouth daily.        Allergies:  Allergies  Allergen Reactions   Cyclobenzaprine     sick   Tramadol Other (See Comments)    sick    Family History: Family History  Problem Relation Age of Onset   Cancer Mother    Heart failure Father     Social History:  reports that she has quit smoking. Her smoking use included cigarettes. She has been exposed to tobacco smoke. She has never used smokeless tobacco. She reports that she does not drink alcohol and does not use drugs.  ROS: All other review of systems were reviewed and are negative except what is noted above in HPI  Physical Exam: BP (!) 176/74   Pulse 80   Constitutional:  Alert and oriented, No acute distress. HEENT: [place] AT, moist mucus membranes.  Trachea midline, no masses. Cardiovascular: No clubbing, cyanosis, or edema. Respiratory: Normal respiratory effort, no increased work of breathing. GI: Abdomen is soft, nontender, nondistended, no abdominal masses GU: No CVA tenderness.  Lymph: No cervical or inguinal lymphadenopathy. Skin: No rashes, bruises or suspicious lesions. Neurologic: Grossly intact, no focal deficits, moving all 4 extremities. Psychiatric: Normal mood and affect.  Laboratory Data: Lab Results  Component Value Date   WBC 8.4 06/20/2021   HGB 13.3 06/20/2021   HCT 39.7 06/20/2021   MCV 93.6 06/20/2021   PLT 235 06/20/2021    Lab Results  Component Value Date   CREATININE 1.00 09/18/2021    No results found for: "PSA"  No results found for: "TESTOSTERONE"  No results found for: "HGBA1C"  Urinalysis    Component Value Date/Time   COLORURINE YELLOW 07/31/2020 1651   APPEARANCEUR Clear 05/05/2021 1322   LABSPEC 1.017 07/31/2020 1651    PHURINE 6.0 07/31/2020 1651   GLUCOSEU Negative 05/05/2021 1322   HGBUR NEGATIVE 07/31/2020 1651   BILIRUBINUR Negative 05/05/2021 1322   KETONESUR NEGATIVE 07/31/2020 1651   PROTEINUR Negative 05/05/2021 1322   PROTEINUR NEGATIVE 07/31/2020 1651   UROBILINOGEN negative 01/31/2015 1149   NITRITE Negative 05/05/2021 1322   NITRITE NEGATIVE 07/31/2020 1651   LEUKOCYTESUR Negative 05/05/2021 1322   LEUKOCYTESUR NEGATIVE 07/31/2020 1651    Lab Results  Component Value Date   LABMICR Comment 05/05/2021   MUCUS negative 01/31/2015   BACTERIA negative 01/31/2015    Pertinent Imaging:  No results found for this or any previous visit.  No results found for this or any previous visit.  No results found for this or any previous visit.  No results found  for this or any previous visit.  Results for orders placed during the hospital encounter of 11/18/21  US RENAL  Narrative CLINICAL DATA:  Renal lesion, follow-up  EXAM: RENAL / URINARY TRACT ULTRASOUND COMPLETE  COMPARISON:  CT abdomen and pelvis 09/18/2021, renal ultrasound 05/01/2021  FINDINGS: Right Kidney:  Renal measurements: 10.0 x 3.3 x 4.9 cm = volume: 85 mL. Cortical thinning. Normal cortical echogenicity. No hydronephrosis. Small exophytic cyst lateral RIGHT kidney 19 x 17 x 18 mm, containing a few scattered internal echoes. Additional tiny cyst upper pole 8 x 8 x 9 mm, simple features. Small echogenic focus consistent with calculus 4 mm diameter at inferior pole, also noted on prior CT.  Left Kidney:  Renal measurements: 9.9 x 5.0 x 5.0 cm = volume: 129 mL. Normal cortical thickness and echogenicity. Mild collecting system dilatation. Heterogeneous solid-appearing mass at upper pole, exophytic, 16 x 15 x 16 mm, unchanged in size since recent CT. Lesion is highly suspicious for renal neoplasm. No additional masses or shadowing calcifications.  Bladder:  Appears normal for degree of bladder distention.  Ureteral jets visualized. No postvoid bladder residual volume.  Other:  N/A  IMPRESSION: Mild LEFT renal collecting system dilatation.  Two small cysts within RIGHT kidney, the larger of which contains a few scattered internal echoes but is stable in size since 09/18/2021 and slightly smaller since the earlier ultrasound of 05/01/2021; no follow-up imaging recommended.  16 mm diameter heterogeneous solid mass at upper pole LEFT kidney, unchanged in size since recent CT, highly suspicious for renal neoplasm.  4 mm nonobstructing calculus at inferior pole RIGHT kidney.   Electronically Signed By: Lavonia Dana M.D. On: 11/19/2021 17:12  No valid procedures specified. No results found for this or any previous visit.  No results found for this or any previous visit.   Assessment & Plan:    1. Renal lesion -RTC 1 year with renal US - Urinalysis, Routine w reflex microscopic  2. OAB (overactive bladder) -Increase mirabegron '50mg'$  daily.    No follow-ups on file.  Nicolette Bang, MD  [facility] [place]

## 2021-11-27 LAB — URINALYSIS, ROUTINE W REFLEX MICROSCOPIC
Bilirubin, UA: NEGATIVE
Glucose, UA: NEGATIVE
Nitrite, UA: NEGATIVE
RBC, UA: NEGATIVE
Specific Gravity, UA: 1.025 (ref 1.005–1.030)
Urobilinogen, Ur: 1 mg/dL (ref 0.2–1.0)
pH, UA: 5.5 (ref 5.0–7.5)

## 2021-11-27 LAB — MICROSCOPIC EXAMINATION: Bacteria, UA: NONE SEEN

## 2021-12-10 ENCOUNTER — Encounter: Payer: Self-pay | Admitting: *Deleted

## 2021-12-10 DIAGNOSIS — E1165 Type 2 diabetes mellitus with hyperglycemia: Secondary | ICD-10-CM | POA: Diagnosis not present

## 2021-12-10 DIAGNOSIS — R194 Change in bowel habit: Secondary | ICD-10-CM

## 2021-12-10 DIAGNOSIS — K625 Hemorrhage of anus and rectum: Secondary | ICD-10-CM

## 2021-12-10 MED ORDER — PEG 3350-KCL-NA BICARB-NACL 420 G PO SOLR: 4000.0000 mL | Freq: Once | ORAL | 0 refills | Status: AC

## 2021-12-10 NOTE — Telephone Encounter (Signed)
Pt has been scheduled for 01/15/22 at 7:30 am, instructions mailed and prep sent to the pharmacy.  [employer] PA: Approved Authorization #[id number]  Tracking #[id number]  DOS:01/15/2022 - 03/06/2022

## 2021-12-30 DIAGNOSIS — Z6829 Body mass index (BMI) 29.0-29.9, adult: Secondary | ICD-10-CM | POA: Diagnosis not present

## 2021-12-30 DIAGNOSIS — Z299 Encounter for prophylactic measures, unspecified: Secondary | ICD-10-CM | POA: Diagnosis not present

## 2021-12-30 DIAGNOSIS — M549 Dorsalgia, unspecified: Secondary | ICD-10-CM | POA: Diagnosis not present

## 2021-12-30 DIAGNOSIS — E1165 Type 2 diabetes mellitus with hyperglycemia: Secondary | ICD-10-CM | POA: Diagnosis not present

## 2021-12-30 DIAGNOSIS — I10 Essential (primary) hypertension: Secondary | ICD-10-CM | POA: Diagnosis not present

## 2022-01-09 DIAGNOSIS — H524 Presbyopia: Secondary | ICD-10-CM | POA: Diagnosis not present

## 2022-01-09 DIAGNOSIS — H35033 Hypertensive retinopathy, bilateral: Secondary | ICD-10-CM | POA: Diagnosis not present

## 2022-01-09 DIAGNOSIS — E1165 Type 2 diabetes mellitus with hyperglycemia: Secondary | ICD-10-CM | POA: Diagnosis not present

## 2022-01-13 ENCOUNTER — Telehealth: Payer: Self-pay | Admitting: *Deleted

## 2022-01-13 ENCOUNTER — Encounter: Payer: Self-pay | Admitting: *Deleted

## 2022-01-13 NOTE — Telephone Encounter (Signed)
Pt left vm that she needed to reschedule her procedure from 01/15/22 due to being sick.  Spoke with pt, rescheduled procedure until 02/09/22 at 9 am, new instructions mailed to pt.

## 2022-02-06 ENCOUNTER — Other Ambulatory Visit (HOSPITAL_COMMUNITY)
Admission: RE | Admit: 2022-02-06 | Discharge: 2022-02-06 | Disposition: A | Discharge status: Home / Self Care | Payer: Medicare PPO | Source: Ambulatory Visit | Attending: Internal Medicine | Admitting: Internal Medicine

## 2022-02-06 DIAGNOSIS — K625 Hemorrhage of anus and rectum: Secondary | ICD-10-CM

## 2022-02-06 DIAGNOSIS — R194 Change in bowel habit: Secondary | ICD-10-CM | POA: Insufficient documentation

## 2022-02-06 LAB — BASIC METABOLIC PANEL
Anion gap: 8 (ref 5–15)
BUN: 13 mg/dL (ref 8–23)
CO2: 26 mmol/L (ref 22–32)
Calcium: 9.1 mg/dL (ref 8.9–10.3)
Chloride: 103 mmol/L (ref 98–111)
Creatinine, Ser: 0.84 mg/dL (ref 0.44–1.00)
GFR, Estimated: >60 mL/min (ref >60)
Glucose, Bld: 105 mg/dL — ABNORMAL HIGH (ref 70–99)
Potassium: 4.4 mmol/L (ref 3.5–5.1)
Sodium: 137 mmol/L (ref 135–145)

## 2022-02-09 ENCOUNTER — Encounter: Payer: Self-pay | Admitting: *Deleted

## 2022-02-09 ENCOUNTER — Telehealth: Payer: Self-pay | Admitting: *Deleted

## 2022-02-09 DIAGNOSIS — K625 Hemorrhage of anus and rectum: Secondary | ICD-10-CM

## 2022-02-09 DIAGNOSIS — R194 Change in bowel habit: Secondary | ICD-10-CM

## 2022-02-09 DIAGNOSIS — E1165 Type 2 diabetes mellitus with hyperglycemia: Secondary | ICD-10-CM | POA: Diagnosis not present

## 2022-02-09 NOTE — Telephone Encounter (Signed)
Received message from Endo stating that pt had cancelled procedure this morning.   Attempted to call pt, vm not set up

## 2022-02-09 NOTE — Telephone Encounter (Signed)
Pt called back and rescheduled for 02/25/22 at 11:30 am. New instructions have been sent to pt

## 2022-02-11 DIAGNOSIS — Z1331 Encounter for screening for depression: Secondary | ICD-10-CM | POA: Diagnosis not present

## 2022-02-11 DIAGNOSIS — I10 Essential (primary) hypertension: Secondary | ICD-10-CM | POA: Diagnosis not present

## 2022-02-11 DIAGNOSIS — Z6829 Body mass index (BMI) 29.0-29.9, adult: Secondary | ICD-10-CM | POA: Diagnosis not present

## 2022-02-11 DIAGNOSIS — Z1339 Encounter for screening examination for other mental health and behavioral disorders: Secondary | ICD-10-CM | POA: Diagnosis not present

## 2022-02-11 DIAGNOSIS — Z299 Encounter for prophylactic measures, unspecified: Secondary | ICD-10-CM | POA: Diagnosis not present

## 2022-02-11 DIAGNOSIS — I700 Atherosclerosis of aorta: Secondary | ICD-10-CM | POA: Diagnosis not present

## 2022-02-11 DIAGNOSIS — Z7189 Other specified counseling: Secondary | ICD-10-CM | POA: Diagnosis not present

## 2022-02-11 DIAGNOSIS — Z0000 Encounter for general adult medical examination without abnormal findings: Secondary | ICD-10-CM | POA: Diagnosis not present

## 2022-02-11 DIAGNOSIS — F339 Major depressive disorder, recurrent, unspecified: Secondary | ICD-10-CM | POA: Diagnosis not present

## 2022-02-12 DIAGNOSIS — Z79899 Other long term (current) drug therapy: Secondary | ICD-10-CM | POA: Diagnosis not present

## 2022-02-12 DIAGNOSIS — E559 Vitamin D deficiency, unspecified: Secondary | ICD-10-CM | POA: Diagnosis not present

## 2022-02-12 DIAGNOSIS — E7800 Pure hypercholesterolemia, unspecified: Secondary | ICD-10-CM | POA: Diagnosis not present

## 2022-02-12 DIAGNOSIS — E039 Hypothyroidism, unspecified: Secondary | ICD-10-CM | POA: Diagnosis not present

## 2022-02-12 DIAGNOSIS — R5383 Other fatigue: Secondary | ICD-10-CM | POA: Diagnosis not present

## 2022-02-23 ENCOUNTER — Encounter (HOSPITAL_COMMUNITY): Payer: Self-pay

## 2022-02-23 ENCOUNTER — Encounter (HOSPITAL_COMMUNITY)
Admission: RE | Admit: 2022-02-23 | Discharge: 2022-02-23 | Disposition: A | Discharge status: Home / Self Care | Payer: Medicare PPO | Source: Ambulatory Visit | Attending: Internal Medicine | Admitting: Internal Medicine

## 2022-02-25 ENCOUNTER — Ambulatory Visit (HOSPITAL_COMMUNITY)
Admission: RE | Admit: 2022-02-25 | Discharge: 2022-02-25 | Disposition: A | Discharge status: Home / Self Care | Payer: Medicare PPO | Source: Ambulatory Visit | Attending: Internal Medicine | Admitting: Internal Medicine

## 2022-02-25 ENCOUNTER — Encounter (HOSPITAL_COMMUNITY): Payer: Self-pay | Admitting: Internal Medicine

## 2022-02-25 ENCOUNTER — Ambulatory Visit (HOSPITAL_COMMUNITY): Payer: Medicare PPO | Admitting: Anesthesiology

## 2022-02-25 ENCOUNTER — Encounter (HOSPITAL_COMMUNITY)
Admission: RE | Disposition: A | Discharge status: Home / Self Care | Payer: Self-pay | Source: Ambulatory Visit | Attending: Internal Medicine

## 2022-02-25 DIAGNOSIS — Z87891 Personal history of nicotine dependence: Secondary | ICD-10-CM | POA: Diagnosis not present

## 2022-02-25 DIAGNOSIS — Z7984 Long term (current) use of oral hypoglycemic drugs: Secondary | ICD-10-CM | POA: Diagnosis not present

## 2022-02-25 DIAGNOSIS — F418 Other specified anxiety disorders: Secondary | ICD-10-CM | POA: Insufficient documentation

## 2022-02-25 DIAGNOSIS — K635 Polyp of colon: Secondary | ICD-10-CM | POA: Insufficient documentation

## 2022-02-25 DIAGNOSIS — K921 Melena: Secondary | ICD-10-CM | POA: Diagnosis not present

## 2022-02-25 DIAGNOSIS — K5730 Diverticulosis of large intestine without perforation or abscess without bleeding: Secondary | ICD-10-CM

## 2022-02-25 DIAGNOSIS — E119 Type 2 diabetes mellitus without complications: Secondary | ICD-10-CM | POA: Diagnosis not present

## 2022-02-25 DIAGNOSIS — R194 Change in bowel habit: Secondary | ICD-10-CM

## 2022-02-25 DIAGNOSIS — K625 Hemorrhage of anus and rectum: Secondary | ICD-10-CM

## 2022-02-25 DIAGNOSIS — D128 Benign neoplasm of rectum: Secondary | ICD-10-CM

## 2022-02-25 DIAGNOSIS — D120 Benign neoplasm of cecum: Secondary | ICD-10-CM | POA: Diagnosis not present

## 2022-02-25 DIAGNOSIS — K621 Rectal polyp: Secondary | ICD-10-CM | POA: Diagnosis not present

## 2022-02-25 DIAGNOSIS — D124 Benign neoplasm of descending colon: Secondary | ICD-10-CM

## 2022-02-25 DIAGNOSIS — I10 Essential (primary) hypertension: Secondary | ICD-10-CM | POA: Diagnosis not present

## 2022-02-25 HISTORY — PX: COLONOSCOPY WITH PROPOFOL: SHX5780

## 2022-02-25 HISTORY — PX: POLYPECTOMY: SHX5525

## 2022-02-25 LAB — GLUCOSE, CAPILLARY: Glucose-Capillary: 106 mg/dL — ABNORMAL HIGH (ref 70–99)

## 2022-02-25 SURGERY — COLONOSCOPY WITH PROPOFOL
Anesthesia: General

## 2022-02-25 MED ORDER — LACTATED RINGERS IV SOLN: INTRAVENOUS | Status: DC

## 2022-02-25 MED ORDER — LIDOCAINE HCL (CARDIAC) PF 100 MG/5ML IV SOSY
PREFILLED_SYRINGE | INTRAVENOUS | Status: DC | PRN
  Administered 2022-02-25: 50 mg via INTRAVENOUS

## 2022-02-25 MED ORDER — SIMETHICONE 40 MG/0.6ML PO SUSP
40.0000 mg | Freq: Once | ORAL | Status: AC
  Administered 2022-02-25: 40 mg via ORAL

## 2022-02-25 MED ORDER — PROPOFOL 500 MG/50ML IV EMUL
INTRAVENOUS | Status: DC | PRN
  Administered 2022-02-25: 100 ug/kg/min via INTRAVENOUS

## 2022-02-25 MED ORDER — PROPOFOL 10 MG/ML IV BOLUS
INTRAVENOUS | Status: DC | PRN
  Administered 2022-02-25: 100 mg via INTRAVENOUS
  Administered 2022-02-25: 40 mg via INTRAVENOUS

## 2022-02-25 MED ORDER — SIMETHICONE 40 MG/0.6ML PO SUSP
ORAL | Status: AC
  Filled 2022-02-25: qty 0.6

## 2022-02-25 NOTE — Progress Notes (Signed)
PT c/o lower abd pain, states pain has been chronic and has not change from baseline. Dr. Gala Romney made aware, see MAR for simethicone given. Will continue monitor

## 2022-02-25 NOTE — H&P (Signed)
$@LOGOx$ @   Primary Care Physician:  Monico Blitz, MD Primary Gastroenterologist:  Dr. Gala Romney  Pre-Procedure History & Physical: HPI:  Connie Kent is a 77 y.o. female here for  further evaluation of rectal bleeding last colonoscopy about 20 years ago.  Past Medical History:  Diagnosis Date   Diabetes mellitus without complication (HCC)    GERD (gastroesophageal reflux disease)    H. pylori infection    Confirmed eradication on EGD in September 2022   Hyperlipidemia    Hypertension    Thyroid disease     Past Surgical History:  Procedure Laterality Date   ABDOMINAL HYSTERECTOMY     BIOPSY  09/23/2020   Procedure: BIOPSY;  Surgeon: Daneil Dolin, MD;  Location: [facility];  Service: Endoscopy;;   breast biopsies     BREAST BIOPSY Bilateral    [place]   CHOLECYSTECTOMY     COLONOSCOPY  06/02/1999   Dr. Gala Romney; internal and external hemorrhoids, poor prep.   COLONOSCOPY  09/08/2002   Dr. Gala Romney; friable internal hemorrhoids, scattered left-sided diverticula, otherwise normal colon and TI.   ESOPHAGOGASTRODUODENOSCOPY  2003   Dr. Gala Romney; normal esophagus s/p empiric dilation, superficial antral erosions.   ESOPHAGOGASTRODUODENOSCOPY  09/08/2002   Small hiatal hernia, otherwise normal.   ESOPHAGOGASTRODUODENOSCOPY  08/22/2007   Dr. Gala Romney; normal esophagus s/p empiric dilation, small hiatal hernia, benign-appearing polypoid antral mucosa, otherwise normal.   ESOPHAGOGASTRODUODENOSCOPY (EGD) WITH PROPOFOL N/A 09/23/2020   Procedure: ESOPHAGOGASTRODUODENOSCOPY (EGD) WITH PROPOFOL;  Surgeon: Daneil Dolin, MD;  Location: [facility];  Service: Endoscopy;  Laterality: N/A;  7:30am   EYE SURGERY     HEMORRHOID SURGERY     MALONEY DILATION N/A 09/23/2020   Procedure: Venia Minks DILATION;  Surgeon: Daneil Dolin, MD;  Location: [facility];  Service: Endoscopy;  Laterality: N/A;   THROAT SURGERY     Biopsy    Prior to Admission medications   Medication Sig Start Date End  Date Taking? Authorizing Provider  acetaminophen (TYLENOL) 500 MG tablet Take 1,000 mg by mouth daily as needed for moderate pain.   Yes [provider]  cholecalciferol (VITAMIN D3) 25 MCG (1000 UNIT) tablet Take 1,000 Units by mouth daily.   Yes [provider]  Cyanocobalamin (B-12) 2500 MCG TABS Take 2,500 mcg by mouth daily.   Yes [provider]  FLUoxetine (PROZAC) 20 MG capsule Take 20 mg by mouth daily. 05/04/20  Yes [provider]  hydrochlorothiazide (HYDRODIURIL) 25 MG tablet Take 25 mg by mouth daily. 03/26/21  Yes [provider]  HYDROcodone-acetaminophen (NORCO/VICODIN) 5-325 MG tablet Take 0.5-1 tablets by mouth 2 (two) times daily as needed for severe pain. 04/19/20  Yes [provider]  hydrocortisone (ANUSOL-HC) 2.5 % rectal cream Place 1 application. rectally 2 (two) times daily. Patient taking differently: Place 1 application  rectally 2 (two) times daily as needed for hemorrhoids. 06/17/21  Yes Erenest Rasher, PA-C  levothyroxine (SYNTHROID) 75 MCG tablet Take 75 mcg by mouth daily before breakfast.   Yes [provider]  meclizine (ANTIVERT) 25 MG tablet Take 50 mg by mouth daily. 05/04/20  Yes [provider]  meclizine (DRAMAMINE) 25 MG tablet Take 25 mg by mouth 3 (three) times daily as needed for nausea.   Yes [provider]  metFORMIN (GLUCOPHAGE) 500 MG tablet Take 500 mg by mouth daily. 12/30/21  Yes [provider]  metoprolol tartrate (LOPRESSOR) 25 MG tablet Take 25 mg by mouth daily. 06/04/16  Yes [provider]  Multiple Vitamins-Minerals (HAIR SKIN AND NAILS FORMULA PO) Take 1 tablet by mouth daily.   Yes [provider]  Omega-3 Fatty Acids (FISH OIL PO) Take 700 mg by mouth daily.   Yes [provider]  pantoprazole (PROTONIX) 40 MG tablet Take 1 tablet by mouth once daily 07/14/21  Yes Jodi Mourning, Kristen S, PA-C  penicillin v potassium (VEETID) 500 MG  tablet Take 500 mg by mouth daily. 01/07/22  Yes [provider]  predniSONE (DELTASONE) 5 MG tablet Take 5 mg by mouth daily as needed (back pain).   Yes [provider]  vitamin E 180 MG (400 UNITS) capsule Take 400 Units by mouth daily.   Yes [provider]    Allergies as of 12/10/2021 - Review Complete 11/26/2021  Allergen Reaction Noted   Cyclobenzaprine  03/12/2016   Tramadol Other (See Comments) 03/12/2016    Family History  Problem Relation Age of Onset   Cancer Mother    Heart failure Father     Social History   Socioeconomic History   Marital status: Married    Spouse name: Not on file   Number of children: 4   Years of education: Not on file   Highest education level: Not on file  Occupational History   Occupation: Retired  Tobacco Use   Smoking status: Former    Types: Cigarettes    Passive exposure: Past   Smokeless tobacco: Never  Scientific laboratory technician Use: Never used  Substance and Sexual Activity   Alcohol use: No   Drug use: No   Sexual activity: Not Currently  Other Topics Concern   Not on file  Social History Narrative   Not on file   Social Determinants of Health   Financial Resource Strain: Not on file  Food Insecurity: Not on file  Transportation Needs: Not on file  Physical Activity: Not on file  Stress: Not on file  Social Connections: Not on file  Intimate Partner Violence: Not on file    Review of Systems: See HPI, otherwise negative ROS  Physical Exam: BP (!) 143/74   Pulse 82   Temp 98.5 F (36.9 C) (Oral)   Resp 16   Ht 5' 1"$  (1.549 m)   Wt 70.3 kg   SpO2 96%   BMI 29.29 kg/m  General:   Alert,  Well-developed, well-nourished, pleasant and cooperative in NAD Neck:  Supple; no masses or thyromegaly. No significant cervical adenopathy. Lungs:  Clear throughout to auscultation.   No wheezes, crackles, or rhonchi. No acute distress. Heart:  Regular rate and rhythm; no murmurs, clicks, rubs,  or  gallops. Abdomen: Non-distended, normal bowel sounds.  Soft and nontender without appreciable mass or hepatosplenomegaly.  Pulses:  Normal pulses noted. Extremities:  Without clubbing or edema.  Impression/Plan:    77 year old lady with intermittent rectal bleeding.  Last colonoscopy over 2 decades ago.  Here for diagnostic colonoscopy per plan. The risks, benefits, limitations, alternatives and imponderables have been reviewed with the patient. Questions have been answered. All parties are agreeable.       Notice: This dictation was prepared with Dragon dictation along with smaller phrase technology. Any transcriptional errors that result from this process are unintentional and may not be corrected upon review.

## 2022-02-25 NOTE — Anesthesia Preprocedure Evaluation (Signed)
Anesthesia Evaluation  Patient identified by MRN, date of birth, ID band Patient awake    Reviewed: Allergy & Precautions, H&P , NPO status , Patient's Chart, lab work & pertinent test results, reviewed documented beta blocker date and time   Airway Mallampati: II  TM Distance: >3 FB Neck ROM: full    Dental no notable dental hx.    Pulmonary neg pulmonary ROS, former smoker   Pulmonary exam normal breath sounds clear to auscultation       Cardiovascular Exercise Tolerance: Good hypertension, negative cardio ROS  Rhythm:regular Rate:Normal     Neuro/Psych   Anxiety Depression    negative neurological ROS  negative psych ROS   GI/Hepatic negative GI ROS, Neg liver ROS,,,  Endo/Other  negative endocrine ROSdiabetes    Renal/GU negative Renal ROS  negative genitourinary   Musculoskeletal   Abdominal   Peds  Hematology negative hematology ROS (+)   Anesthesia Other Findings   Reproductive/Obstetrics negative OB ROS                             Anesthesia Physical Anesthesia Plan  ASA: 2  Anesthesia Plan: General   Post-op Pain Management:    Induction:   PONV Risk Score and Plan: Propofol infusion  Airway Management Planned:   Additional Equipment:   Intra-op Plan:   Post-operative Plan:   Informed Consent: I have reviewed the patients History and Physical, chart, labs and discussed the procedure including the risks, benefits and alternatives for the proposed anesthesia with the patient or authorized representative who has indicated his/her understanding and acceptance.     Dental Advisory Given  Plan Discussed with: CRNA  Anesthesia Plan Comments:        Anesthesia Quick Evaluation

## 2022-02-25 NOTE — Discharge Instructions (Signed)
  Colonoscopy Discharge Instructions  Read the instructions outlined below and refer to this sheet in the next few weeks. These discharge instructions provide you with general information on caring for yourself after you leave the hospital. Your doctor may also give you specific instructions. While your treatment has been planned according to the most current medical practices available, unavoidable complications occasionally occur. If you have any problems or questions after discharge, call Dr. Gala Romney at [phone]. ACTIVITY You may resume your regular activity, but move at a slower pace for the next 24 hours.  Take frequent rest periods for the next 24 hours.  Walking will help get rid of the air and reduce the bloated feeling in your belly (abdomen).  No driving for 24 hours (because of the medicine (anesthesia) used during the test).   Do not sign any important legal documents or operate any machinery for 24 hours (because of the anesthesia used during the test).  NUTRITION Drink plenty of fluids.  You may resume your normal diet as instructed by your doctor.  Begin with a light meal and progress to your normal diet. Heavy or fried foods are harder to digest and may make you feel sick to your stomach (nauseated).  Avoid alcoholic beverages for 24 hours or as instructed.  MEDICATIONS You may resume your normal medications unless your doctor tells you otherwise.  WHAT YOU CAN EXPECT TODAY Some feelings of bloating in the abdomen.  Passage of more gas than usual.  Spotting of blood in your stool or on the toilet paper.  IF YOU HAD POLYPS REMOVED DURING THE COLONOSCOPY: No aspirin products for 7 days or as instructed.  No alcohol for 7 days or as instructed.  Eat a soft diet for the next 24 hours.  FINDING OUT THE RESULTS OF YOUR TEST Not all test results are available during your visit. If your test results are not back during the visit, make an appointment with your caregiver to find out the  results. Do not assume everything is normal if you have not heard from your caregiver or the medical facility. It is important for you to follow up on all of your test results.  SEEK IMMEDIATE MEDICAL ATTENTION IF: You have more than a spotting of blood in your stool.  Your belly is swollen (abdominal distention).  You are nauseated or vomiting.  You have a temperature over 101.  You have abdominal pain or discomfort that is severe or gets worse throughout the day.      3 polyps removed from your colon today  Colon polyp and diverticulosis information provided  Further recommendations to follow pending review of pathology report  At patient request I called Dagoberto Reef at [phone]-PVVZSMOL findings and recommendations  Patient may experience a little bleeding with her next 1 or 2 bowel movements.  However, she should have no more bleeding than that.

## 2022-02-25 NOTE — Op Note (Signed)
[facility] Patient Name: Connie Kent Procedure Date: 02/25/2022 10:37 AM MRN: [id number] Date of Birth: 16-Apr-1945 Attending MD: Norvel Richards , MD, [id number] CSN: [id number] Age: 77 Admit Type: Outpatient Procedure:                Colonoscopy Indications:              Hematochezia Providers:                Norvel Richards, MD, Charlsie Quest. Theda Sers RN, RN,                            Aram Candela Referring MD:              Medicines:                Propofol per Anesthesia Complications:            No immediate complications. Estimated Blood Loss:     Estimated blood loss was minimal. Procedure:                Pre-Anesthesia Assessment:                           - Prior to the procedure, a History and Physical                            was performed, and patient medications and                            allergies were reviewed. The patient's tolerance of                            previous anesthesia was also reviewed. The risks                            and benefits of the procedure and the sedation                            options and risks were discussed with the patient.                            All questions were answered, and informed consent                            was obtained. Prior Anticoagulants: The patient has                            taken no anticoagulant or antiplatelet agents. ASA                            Grade Assessment: III - A patient with severe                            systemic disease. After reviewing the risks and  benefits, the patient was deemed in satisfactory                            condition to undergo the procedure.                           After obtaining informed consent, the colonoscope                            was passed under direct vision. Throughout the                            procedure, the patient's blood pressure, pulse, and                            oxygen saturations were  monitored continuously. The                            [id number]) scope was introduced through                            the anus and advanced to the the cecum, identified                            by appendiceal orifice and ileocecal valve. The                            colonoscopy was performed without difficulty. The                            patient tolerated the procedure well. The quality                            of the bowel preparation was adequate. The                            ileocecal valve, appendiceal orifice, and rectum                            were photographed. The entire colon was well                            visualized. Scope In: 10:59:39 AM Scope Out: 11:20:23 AM Scope Withdrawal Time: 0 hours 11 minutes 4 seconds  Total Procedure Duration: 0 hours 20 minutes 44 seconds  Findings:      The perianal and digital rectal examinations were normal. Left colon       noncompliant. Somewhat difficult to negotiate. External abdominal       pressure required to reach the cecum.      Scattered small-mouthed diverticula were found in the sigmoid colon.      Three semi-pedunculated polyps were found in the mid rectum, descending       colon and cecum. The polyps were 5 to 8 mm in size. These polyps were       removed with a saline  injection-lift technique using a cold snare.       Resection and retrieval were complete. Estimated blood loss was minimal.      The exam was otherwise without abnormality. Rectal mucosa seen well       on?"face. Rectal vault too small to retroflex. Impression:               - Diverticulosis in the sigmoid colon. Noncompliant                            left colon.                           - Three 5 to 8 mm polyps in the mid rectum, in the                            descending colon and in the cecum, removed using                            injection-lift and a cold snare. Resected and                            retrieved.                            - The examination was otherwise normal. Moderate Sedation:      Moderate (conscious) sedation was personally administered by an       anesthesia professional. The following parameters were monitored: oxygen       saturation, heart rate, blood pressure, respiratory rate, EKG, adequacy       of pulmonary ventilation, and response to care. Recommendation:           - Patient has a contact number available for                            emergencies. The signs and symptoms of potential                            delayed complications were discussed with the                            patient. Return to normal activities tomorrow.                            Written discharge instructions were provided to the                            patient.                           - Advance diet as tolerated.                           - Continue present medications.                           - Repeat colonoscopy date to  be determined after                            pending pathology results are reviewed for                            surveillance based on pathology results.                           - Return to GI office (date not yet determined). Procedure Code(s):        --- Professional ---                           [id number], Colonoscopy, flexible; with removal of                            tumor(s), polyp(s), or other lesion(s) by snare                            technique                           45381, Colonoscopy, flexible; with directed                            submucosal injection(s), any substance Diagnosis Code(s):        --- Professional ---                           D12.8, Benign neoplasm of rectum                           D12.4, Benign neoplasm of descending colon                           D12.0, Benign neoplasm of cecum                           K92.1, Melena (includes Hematochezia)                           K57.30, Diverticulosis of large intestine without                             perforation or abscess without bleeding CPT copyright 2022 American [employer] Association. All rights reserved. The codes documented in this report are preliminary and upon coder review may  be revised to meet current compliance requirements. Cristopher Estimable. Cigi Bega, MD Norvel Richards, MD 02/25/2022 11:30:03 AM This report has been signed electronically. Number of Addenda: 0

## 2022-02-25 NOTE — Transfer of Care (Signed)
Immediate Anesthesia Transfer of Care Note  Patient: Connie Kent  Procedure(s) Performed: COLONOSCOPY WITH PROPOFOL POLYPECTOMY  Patient Location: Short Stay  Anesthesia Type:General  Level of Consciousness: awake  Airway & Oxygen Therapy: Patient Spontanous Breathing  Post-op Assessment: Report given to RN and Post -op Vital signs reviewed and stable  Post vital signs: Reviewed and stable  Last Vitals:  Vitals Value Taken Time  BP 127/56 02/25/22 1123  Temp 36.8 C 02/25/22 1123  Pulse 79 02/25/22 1123  Resp 14 02/25/22 1123  SpO2 100 % 02/25/22 1123    Last Pain:  Vitals:   02/25/22 1123  TempSrc: Oral  PainSc: 0-No pain         Complications: No notable events documented.

## 2022-02-25 NOTE — Progress Notes (Signed)
Pt states her abd pain is at baseline, denies any new pain or worsening symptoms. Dr. Gala Romney aware and pt stable for d/c. PT informed that if pain worsens to be seen in ED. PT verbalizes understanding

## 2022-02-26 ENCOUNTER — Telehealth: Payer: Self-pay

## 2022-02-26 LAB — SURGICAL PATHOLOGY

## 2022-02-26 NOTE — Telephone Encounter (Signed)
Pt was made aware and verbalized understanding. Instructed pt to go to the emergency room for evaluation per Dr. Roseanne Kaufman recommendations. Pt was having lower abd pain before she had her colonoscopy and is now having worsening abd pain since having completed her colonoscopy especially when sitting.

## 2022-02-26 NOTE — Telephone Encounter (Signed)
Pt called back stating that she took something for the pain and is now feeling better that if she gets to hurting again she will go to the emergency room.

## 2022-02-26 NOTE — Telephone Encounter (Signed)
Pt called stating that she had a colonoscopy yesterday and is having bad lower abdominal pain especially when she sits down. Pt states that it feels like something is pushing up when she sits down and it is painful.

## 2022-02-27 ENCOUNTER — Encounter: Payer: Self-pay | Admitting: Internal Medicine

## 2022-02-28 NOTE — Anesthesia Postprocedure Evaluation (Signed)
Anesthesia Post Note  Patient: Connie Kent  Procedure(s) Performed: COLONOSCOPY WITH PROPOFOL POLYPECTOMY  Patient location during evaluation: Phase II Anesthesia Type: General Level of consciousness: awake Pain management: pain level controlled Vital Signs Assessment: post-procedure vital signs reviewed and stable Respiratory status: spontaneous breathing and respiratory function stable Cardiovascular status: blood pressure returned to baseline and stable Postop Assessment: no headache and no apparent nausea or vomiting Anesthetic complications: no Comments: Late entry   No notable events documented.   Last Vitals:  Vitals:   02/25/22 0949 02/25/22 1123  BP: (!) 143/74 (!) 127/56  Pulse: 82 79  Resp: 16 14  Temp: 36.9 C 36.8 C  SpO2: 96% 100%    Last Pain:  Vitals:   02/25/22 1129  TempSrc:   PainSc: [place]

## 2022-03-03 ENCOUNTER — Encounter (HOSPITAL_COMMUNITY): Payer: Self-pay | Admitting: Internal Medicine

## 2022-03-11 DIAGNOSIS — E1165 Type 2 diabetes mellitus with hyperglycemia: Secondary | ICD-10-CM | POA: Diagnosis not present

## 2022-04-01 DIAGNOSIS — Z299 Encounter for prophylactic measures, unspecified: Secondary | ICD-10-CM | POA: Diagnosis not present

## 2022-04-01 DIAGNOSIS — K219 Gastro-esophageal reflux disease without esophagitis: Secondary | ICD-10-CM | POA: Diagnosis not present

## 2022-04-01 DIAGNOSIS — Z6829 Body mass index (BMI) 29.0-29.9, adult: Secondary | ICD-10-CM | POA: Diagnosis not present

## 2022-04-01 DIAGNOSIS — I10 Essential (primary) hypertension: Secondary | ICD-10-CM | POA: Diagnosis not present

## 2022-04-01 DIAGNOSIS — R109 Unspecified abdominal pain: Secondary | ICD-10-CM | POA: Diagnosis not present

## 2022-04-01 DIAGNOSIS — E1165 Type 2 diabetes mellitus with hyperglycemia: Secondary | ICD-10-CM | POA: Diagnosis not present

## 2022-04-11 DIAGNOSIS — E1165 Type 2 diabetes mellitus with hyperglycemia: Secondary | ICD-10-CM | POA: Diagnosis not present

## 2022-04-20 ENCOUNTER — Telehealth: Payer: Self-pay | Admitting: *Deleted

## 2022-04-20 ENCOUNTER — Other Ambulatory Visit: Payer: Self-pay

## 2022-04-20 MED ORDER — PANTOPRAZOLE SODIUM 40 MG PO TBEC: 40.0000 mg | DELAYED_RELEASE_TABLET | Freq: Every day | ORAL | 1 refills | Status: DC

## 2022-04-20 NOTE — Telephone Encounter (Signed)
Rx was sent to pharmacy. 

## 2022-04-20 NOTE — Telephone Encounter (Signed)
Received refill request for Pantoprazole 40mg . Pt last OV 11/11/2021

## 2022-04-27 ENCOUNTER — Encounter: Payer: Self-pay | Admitting: Gastroenterology

## 2022-04-27 ENCOUNTER — Ambulatory Visit (INDEPENDENT_AMBULATORY_CARE_PROVIDER_SITE_OTHER): Payer: Medicare PPO | Admitting: Gastroenterology

## 2022-04-27 VITALS — BP 134/78 | HR 76 | Temp 97.9°F | Ht 61.0 in | Wt 152.4 lb

## 2022-04-27 DIAGNOSIS — R634 Abnormal weight loss: Secondary | ICD-10-CM

## 2022-04-27 DIAGNOSIS — K5900 Constipation, unspecified: Secondary | ICD-10-CM | POA: Diagnosis not present

## 2022-04-27 DIAGNOSIS — R1084 Generalized abdominal pain: Secondary | ICD-10-CM | POA: Diagnosis not present

## 2022-04-27 DIAGNOSIS — R109 Unspecified abdominal pain: Secondary | ICD-10-CM | POA: Insufficient documentation

## 2022-04-27 NOTE — Progress Notes (Signed)
GI Office Note    Referring Provider: Kirstie Peri, MD Primary Care Physician:  Kirstie Peri, MD  Primary Gastroenterologist: Roetta Sessions, MD   Chief Complaint   Chief Complaint  Patient presents with   Abdominal Pain    History of Present Illness   Connie Kent is a 77 y.o. female presenting today for follow up. Seen in 10/2021 for GERD, nonspecific abdominal pain, rectal bleeding, elevated transaminases/cirrhosis.    Patient frustrated with ongoing abdominal pain. States she would be ok if she didn't have to eat. When she eats, she tends to have abdominal pain. Pain is usually right sided but can radiated down to lower central abdomen. Complains of bloating. Tolerates soft foods like boiled eggs, or 1/2 banana sandwich but any more solid or meat she has abdominal pain. She also feels like she doesn't know what to eat given her stomach issues and diabetes. Often feels very hungry and has to grab jar of peanut butter or she feels sick. No heartburn or dysphagia. She is having one BM daily but has to strain. No melena, occasional brbpr on toilet tissue.   Patient feels like symptoms worse since colonoscopy 02/2022. Lost about 20 pounds in past two years but weight stable past six months. Diagnosed with diabetes last year per patient. Doesn't really know what to eat. Looks after husband with dementia.   She is worried about left kidney lesion. She worries about waiting for one year renal u/s.   Dietary recall: BF: boiled eggs or oatmeal or cereal Supper: 1/2 sandwhich Pepsi: would not say how much Coffee: am only  CTA recently revealed wildly patent mesenteric vasculature.  Probable left renal cell carcinoma measuring 1.6 cm. Initially seen on 2022 study, 10mm at that time.  Changes of liver consistent with underlying cirrhosis,  no evidence of portal hypertension.No evidence of portal hypertension on EGD 1 year ago.  Dysphagia resolved  - status post dilation.  GES 11/2021:  normal  Hep B vaccination advised.   EGD September 2022: -Normal esophagus status post dilation -Normal stomach status post biopsy, mild chronic inflammation with reactive changes.  No H. pylori.  Colonoscopy 02/2022: -diverticulosis in sigmoid colon, noncompliant left colon -three 5-8 polyps in mid rectum, descending colon and cecum -two tubular adenomas -no future colonoscopy unless new symptoms   Medications   Current Outpatient Medications  Medication Sig Dispense Refill   acetaminophen (TYLENOL) 500 MG tablet Take 1,000 mg by mouth daily as needed for moderate pain.     cholecalciferol (VITAMIN D3) 25 MCG (1000 UNIT) tablet Take 1,000 Units by mouth daily.     Cyanocobalamin (B-12) 2500 MCG TABS Take 2,500 mcg by mouth daily.     FLUoxetine (PROZAC) 20 MG capsule Take 20 mg by mouth daily.     hydrochlorothiazide (HYDRODIURIL) 25 MG tablet Take 25 mg by mouth daily.     HYDROcodone-acetaminophen (NORCO/VICODIN) 5-325 MG tablet Take 0.5-1 tablets by mouth 2 (two) times daily as needed for severe pain.     hydrocortisone (ANUSOL-HC) 2.5 % rectal cream Place 1 application. rectally 2 (two) times daily. (Patient taking differently: Place 1 application  rectally 2 (two) times daily as needed for hemorrhoids.) 30 g 1   levothyroxine (SYNTHROID) 75 MCG tablet Take 75 mcg by mouth daily before breakfast.     meclizine (DRAMAMINE) 25 MG tablet Take 25 mg by mouth 3 (three) times daily as needed for nausea.     metFORMIN (GLUCOPHAGE) 500 MG tablet Take  500 mg by mouth daily.     metoprolol tartrate (LOPRESSOR) 25 MG tablet Take 25 mg by mouth daily.     Multiple Vitamins-Minerals (HAIR SKIN AND NAILS FORMULA PO) Take 1 tablet by mouth daily.     Omega-3 Fatty Acids (FISH OIL PO) Take 700 mg by mouth daily.     pantoprazole (PROTONIX) 40 MG tablet Take 1 tablet (40 mg total) by mouth daily. 90 tablet 1   penicillin v potassium (VEETID) 500 MG tablet Take 500 mg by mouth daily.      predniSONE (DELTASONE) 5 MG tablet Take 5 mg by mouth daily as needed (back pain).     vitamin E 180 MG (400 UNITS) capsule Take 400 Units by mouth daily.     No current facility-administered medications for this visit.    Allergies   Allergies as of 04/27/2022 - Review Complete 04/27/2022  Allergen Reaction Noted   Cyclobenzaprine  03/12/2016   Tramadol Other (See Comments) 03/12/2016       Review of Systems   General: Negative for anorexia,  fever, chills, fatigue, weakness. See hpi ENT: Negative for hoarseness, difficulty swallowing , nasal congestion. CV: Negative for chest pain, angina, palpitations, dyspnea on exertion, peripheral edema.  Respiratory: Negative for dyspnea at rest, dyspnea on exertion, cough, sputum, wheezing.  GI: See history of present illness. GU:  Negative for dysuria, hematuria, urinary incontinence, urinary frequency, nocturnal urination.  Endo: Negative for unusual weight change.     Physical Exam   BP 134/78 (BP Location: Right Arm, Patient Position: Sitting, Cuff Size: Normal)   Pulse 76   Temp 97.9 F (36.6 C) (Oral)   Ht  (1.549 m)   Wt 152 lb 6.4 oz (69.1 kg)   SpO2 96%   BMI 28.80 kg/m    General: Well-nourished, well-developed in no acute distress.  Eyes: No icterus. Mouth: Oropharyngeal mucosa moist and pink   Lungs: Clear to auscultation bilaterally.  Heart: Regular rate and rhythm, no murmurs rubs or gallops.  Abdomen: Bowel sounds are normal,  nondistended, no hepatosplenomegaly or masses,  no abdominal bruits or hernia , no rebound or guarding. Diffuse tenderness to palpation. Rectal: not performed Extremities: No lower extremity edema. No clubbing or deformities. Neuro: Alert and oriented x 4   Skin: Warm and dry, no jaundice.   Psych: Alert and cooperative, normal mood and affect.  Labs   Lab Results  Component Value Date   TSH 0.856 06/24/2021   Lab Results  Component Value Date   CREATININE 0.84 02/06/2022    BUN 13 02/06/2022   NA 137 02/06/2022   K 4.4 02/06/2022   CL 103 02/06/2022   CO2 26 02/06/2022   Lab Results  Component Value Date   ALT 15 11/11/2021   AST 24 11/11/2021   ALKPHOS 79 09/23/2020   BILITOT 0.6 11/11/2021   Lab Results  Component Value Date   WBC 8.4 06/20/2021   HGB 13.3 06/20/2021   HCT 39.7 06/20/2021   MCV 93.6 06/20/2021   PLT 235 06/20/2021   Lab Results  Component Value Date   IRON 135 06/20/2021   TIBC 397 06/20/2021   FERRITIN 28 06/20/2021   June 2023: Hepatitis A antibody positive, hepatitis B surface antigen negative, hepatitis B core antibody negative, hepatitis B surface antibody negative, hepatitis C antibody negative.  Imaging Studies   No results found.  Assessment   Generalized abdominal pain: Etiology unclear. Somewhat poor historian. Notes pain after "solid foods". On  exam, generalized abdominal pain with light to moderate palpation. Constipation may be contributing. Extensive evaluation as outlined. Not sure EGD will add any benefit at this time based on her symptoms.   Constipation: start bowel regimen to see if contributing to abdominal pain.  Cirrhosis: has been well compensated. Will update labs. Consider hepatoma screening with u/s pending labs.  Weight loss: stabilized, may have been due to onset of diabetes last year.   Left renal lesion: follow up with Dr. Ronne Binning. Numerous studies with concern for renal cell carcinoma. Patient reports ongoing surveillance but she is worried about this approach. Next u/s planned in 11/2022 per urology recommendations.    PLAN   Start Miralax one capful daily. Reach out to Dr. Dimas Millin office with concerns about renal lesion management. Patient has concerns about ongoing surveillance. Labs to be complete for cirrhosis, abdominal pain. Patient may benefit from nutrition consult as she has concerns about appropriate diabetes diet.    Leanna Battles. Melvyn Neth, MHS, PA-C [facility]

## 2022-04-27 NOTE — Patient Instructions (Signed)
For constipation, please start miralax one capful daily. Please complete your lab work. We will be in touch with further recommendations. Please reach out to Dr. Ronne Binning regarding your left kidney lesion.

## 2022-04-29 ENCOUNTER — Ambulatory Visit: Payer: Medicare PPO | Admitting: Urology

## 2022-04-29 ENCOUNTER — Ambulatory Visit (HOSPITAL_COMMUNITY)
Admission: RE | Admit: 2022-04-29 | Discharge: 2022-04-29 | Disposition: A | Discharge status: Home / Self Care | Payer: Medicare PPO | Source: Ambulatory Visit | Attending: Urology | Admitting: Urology

## 2022-04-29 ENCOUNTER — Other Ambulatory Visit: Payer: Self-pay

## 2022-04-29 VITALS — BP 137/77 | HR 106

## 2022-04-29 DIAGNOSIS — R1084 Generalized abdominal pain: Secondary | ICD-10-CM | POA: Diagnosis not present

## 2022-04-29 DIAGNOSIS — N289 Disorder of kidney and ureter, unspecified: Secondary | ICD-10-CM | POA: Diagnosis not present

## 2022-04-29 DIAGNOSIS — R634 Abnormal weight loss: Secondary | ICD-10-CM | POA: Diagnosis not present

## 2022-04-29 DIAGNOSIS — R109 Unspecified abdominal pain: Secondary | ICD-10-CM | POA: Insufficient documentation

## 2022-04-29 DIAGNOSIS — N3001 Acute cystitis with hematuria: Secondary | ICD-10-CM | POA: Diagnosis not present

## 2022-04-29 DIAGNOSIS — K5900 Constipation, unspecified: Secondary | ICD-10-CM | POA: Diagnosis not present

## 2022-04-29 MED ORDER — SULFAMETHOXAZOLE-TRIMETHOPRIM 800-160 MG PO TABS
1.0000 | ORAL_TABLET | Freq: Two times a day (BID) | ORAL | 0 refills | Status: DC
Start: 2022-04-29 — End: 2022-05-22

## 2022-04-29 NOTE — Progress Notes (Signed)
04/29/2022 4:13 PM   Connie Kent [birth year] [id number]  Referring provider: Kirstie Peri, MD [address] [place],  [place] [address]  Followup left renal lesions and dysuria   HPI: Ms Daddona is a 76yo here for followup for a left renal leasion and new onset dysuria. Renal US from 11/18/2021 showed a stable left renal mass. For the past 7-8 days she has noted worsening urinary urgency, frequency, low back pain, suprapubic pain and intermittent dysuria. She denies any gross hematuria. No fevers. UA is concerning for infection   PMH: Past Medical History:  Diagnosis Date   Diabetes mellitus without complication    GERD (gastroesophageal reflux disease)    H. pylori infection    Confirmed eradication on EGD in September 2022   Hyperlipidemia    Hypertension    Thyroid disease     Surgical History: Past Surgical History:  Procedure Laterality Date   ABDOMINAL HYSTERECTOMY     BIOPSY  09/23/2020   Procedure: BIOPSY;  Surgeon: Corbin Ade, MD;  [facility];  Service: Endoscopy;;   breast biopsies     BREAST BIOPSY Bilateral    beghin   CHOLECYSTECTOMY     COLONOSCOPY  06/02/1999   Dr. Jena Gauss; internal and external hemorrhoids, poor prep.   COLONOSCOPY  09/08/2002   Dr. Jena Gauss; friable internal hemorrhoids, scattered left-sided diverticula, otherwise normal colon and TI.   COLONOSCOPY WITH PROPOFOL N/A 02/25/2022   Procedure: COLONOSCOPY WITH PROPOFOL;  Surgeon: Corbin Ade, MD;  [facility]: [facility];  Service: Endoscopy;  Laterality: N/A;  7:30 am   ESOPHAGOGASTRODUODENOSCOPY  2003   Dr. Jena Gauss; normal esophagus s/p empiric dilation, superficial antral erosions.   ESOPHAGOGASTRODUODENOSCOPY  09/08/2002   Small hiatal hernia, otherwise normal.   ESOPHAGOGASTRODUODENOSCOPY  08/22/2007   Dr. Jena Gauss; normal esophagus s/p empiric dilation, small hiatal hernia, benign-appearing polypoid antral mucosa, otherwise normal.   ESOPHAGOGASTRODUODENOSCOPY (EGD) WITH  PROPOFOL N/A 09/23/2020   Procedure: ESOPHAGOGASTRODUODENOSCOPY (EGD) WITH PROPOFOL;  Surgeon: Corbin Ade, MD;  [facility];  Service: Endoscopy;  Laterality: N/A;  7:30am   EYE SURGERY     HEMORRHOID SURGERY     MALONEY DILATION N/A 09/23/2020   Procedure: Elease Hashimoto DILATION;  Surgeon: Corbin Ade, MD;  [facility]: [facility];  Service: Endoscopy;  Laterality: N/A;   POLYPECTOMY  02/25/2022   Procedure: POLYPECTOMY;  Surgeon: Corbin Ade, MD;  Location: [facility];  Service: Endoscopy;;   THROAT SURGERY     Biopsy    Home Medications:  Allergies as of 04/29/2022       Reactions   Cyclobenzaprine    sick   Tramadol Other (See Comments)   sick        Medication List        Accurate as of April 29, 2022  4:13 PM. If you have any questions, ask your nurse or doctor.          acetaminophen 500 MG tablet Commonly known as: TYLENOL Take 1,000 mg by mouth daily as needed for moderate pain.   B-12 2500 MCG Tabs Take 2,500 mcg by mouth daily.   cholecalciferol 25 MCG (1000 UNIT) tablet Commonly known as: VITAMIN D3 Take 1,000 Units by mouth daily.   Dramamine 25 MG tablet Generic drug: meclizine Take 25 mg by mouth 3 (three) times daily as needed for nausea.   FISH OIL PO Take 700 mg by mouth daily.   FLUoxetine 20 MG capsule Commonly known  as: PROZAC Take 20 mg by mouth daily.   HAIR SKIN AND NAILS FORMULA PO Take 1 tablet by mouth daily.   hydrochlorothiazide 25 MG tablet Commonly known as: HYDRODIURIL Take 25 mg by mouth daily.   HYDROcodone-acetaminophen 5-325 MG tablet Commonly known as: NORCO/VICODIN Take 0.5-1 tablets by mouth 2 (two) times daily as needed for severe pain.   hydrocortisone 2.5 % rectal cream Commonly known as: Anusol-HC Place 1 application. rectally 2 (two) times daily. What changed:  when to take this reasons to take this   levothyroxine 75 MCG tablet Commonly known as: SYNTHROID Take 75 mcg by mouth  daily before breakfast.   metFORMIN 500 MG tablet Commonly known as: GLUCOPHAGE Take 500 mg by mouth daily.   metoprolol tartrate 25 MG tablet Commonly known as: LOPRESSOR Take 25 mg by mouth daily.   pantoprazole 40 MG tablet Commonly known as: PROTONIX Take 1 tablet (40 mg total) by mouth daily.   penicillin v potassium 500 MG tablet Commonly known as: VEETID Take 500 mg by mouth daily.   predniSONE 5 MG tablet Commonly known as: DELTASONE Take 5 mg by mouth daily as needed (back pain).   vitamin E 180 MG (400 UNITS) capsule Take 400 Units by mouth daily.        Allergies:  Allergies  Allergen Reactions   Cyclobenzaprine     sick   Tramadol Other (See Comments)    sick    Family History: Family History  Problem Relation Age of Onset   Cancer Mother    Heart failure Father     Social History:  reports that she has quit smoking. Her smoking use included cigarettes. She has been exposed to tobacco smoke. She has never used smokeless tobacco. She reports that she does not drink alcohol and does not use drugs.  ROS: All other review of systems were reviewed and are negative except what is noted above in HPI  Physical Exam: BP 137/77   Pulse (!) 106   Constitutional:  Alert and oriented, No acute distress. HEENT: [place] AT, moist mucus membranes.  Trachea midline, no masses. Cardiovascular: No clubbing, cyanosis, or edema. Respiratory: Normal respiratory effort, no increased work of breathing. GI: Abdomen is soft, nontender, nondistended, no abdominal masses GU: No CVA tenderness.  Lymph: No cervical or inguinal lymphadenopathy. Skin: No rashes, bruises or suspicious lesions. Neurologic: Grossly intact, no focal deficits, moving all 4 extremities. Psychiatric: Normal mood and affect.  Laboratory Data: Lab Results  Component Value Date   WBC 8.4 06/20/2021   HGB 13.3 06/20/2021   HCT 39.7 06/20/2021   MCV 93.6 06/20/2021   PLT 235 06/20/2021    Lab  Results  Component Value Date   CREATININE 0.84 02/06/2022    No results found for: "PSA"  No results found for: "TESTOSTERONE"  No results found for: "HGBA1C"  Urinalysis    Component Value Date/Time   COLORURINE YELLOW 07/31/2020 1651   APPEARANCEUR Hazy (A) 11/26/2021 1112   LABSPEC 1.017 07/31/2020 1651   PHURINE 6.0 07/31/2020 1651   GLUCOSEU Negative 11/26/2021 1112   HGBUR NEGATIVE 07/31/2020 1651   BILIRUBINUR Negative 11/26/2021 1112   KETONESUR NEGATIVE 07/31/2020 1651   PROTEINUR Trace (A) 11/26/2021 1112   PROTEINUR NEGATIVE 07/31/2020 1651   UROBILINOGEN negative 01/31/2015 1149   NITRITE Negative 11/26/2021 1112   NITRITE NEGATIVE 07/31/2020 1651   LEUKOCYTESUR Trace (A) 11/26/2021 1112   LEUKOCYTESUR NEGATIVE 07/31/2020 1651    Lab Results  Component Value Date  LABMICR See below: 11/26/2021   WBCUA 0-5 11/26/2021   LABEPIT 0-10 11/26/2021   MUCUS Present (A) 11/26/2021   BACTERIA None seen 11/26/2021    Pertinent Imaging:  No results found for this or any previous visit.  No results found for this or any previous visit.  No results found for this or any previous visit.  No results found for this or any previous visit.  Results for orders placed during the hospital encounter of 11/18/21  US RENAL  Narrative CLINICAL DATA:  Renal lesion, follow-up  EXAM: RENAL / URINARY TRACT ULTRASOUND COMPLETE  COMPARISON:  CT abdomen and pelvis 09/18/2021, renal ultrasound 05/01/2021  FINDINGS: Right Kidney:  Renal measurements: 10.0 x 3.3 x 4.9 cm = volume: 85 mL. Cortical thinning. Normal cortical echogenicity. No hydronephrosis. Small exophytic cyst lateral RIGHT kidney 19 x 17 x 18 mm, containing a few scattered internal echoes. Additional tiny cyst upper pole 8 x 8 x 9 mm, simple features. Small echogenic focus consistent with calculus 4 mm diameter at inferior pole, also noted on prior CT.  Left Kidney:  Renal measurements: 9.9 x 5.0 x  5.0 cm = volume: 129 mL. Normal cortical thickness and echogenicity. Mild collecting system dilatation. Heterogeneous solid-appearing mass at upper pole, exophytic, 16 x 15 x 16 mm, unchanged in size since recent CT. Lesion is highly suspicious for renal neoplasm. No additional masses or shadowing calcifications.  Bladder:  Appears normal for degree of bladder distention. Ureteral jets visualized. No postvoid bladder residual volume.  Other:  N/A  IMPRESSION: Mild LEFT renal collecting system dilatation.  Two small cysts within RIGHT kidney, the larger of which contains a few scattered internal echoes but is stable in size since 09/18/2021 and slightly smaller since the earlier ultrasound of 05/01/2021; no follow-up imaging recommended.  16 mm diameter heterogeneous solid mass at upper pole LEFT kidney, unchanged in size since recent CT, highly suspicious for renal neoplasm.  4 mm nonobstructing calculus at inferior pole RIGHT kidney.   Electronically Signed By: Ulyses Southward M.D. On: 11/19/2021 17:12  No valid procedures specified. No results found for this or any previous visit.  No results found for this or any previous visit.   Assessment & Plan:    1.  Acute cystitis with hematuria -Bactrim DS BID for 7 days - Urine Culture  2. Left renal mass mass -followup 6 months with renal US   No follow-ups on file.  Wilkie Aye, MD  [facility]

## 2022-04-30 LAB — MICROSCOPIC EXAMINATION: WBC, UA: >30 /hpf — AB (ref 0–5)

## 2022-04-30 LAB — URINALYSIS, ROUTINE W REFLEX MICROSCOPIC
Bilirubin, UA: NEGATIVE
Glucose, UA: NEGATIVE
Ketones, UA: NEGATIVE
Nitrite, UA: POSITIVE — AB
RBC, UA: NEGATIVE
Specific Gravity, UA: 1.03 (ref 1.005–1.030)
Urobilinogen, Ur: 1 mg/dL (ref 0.2–1.0)
pH, UA: 5.5 (ref 5.0–7.5)

## 2022-04-30 LAB — ALPHA-GAL PANEL

## 2022-04-30 LAB — IGA: IgA/Immunoglobulin A, Serum: 264 mg/dL (ref 64–422)

## 2022-05-02 LAB — URINE CULTURE

## 2022-05-03 ENCOUNTER — Encounter: Payer: Self-pay | Admitting: Urology

## 2022-05-03 NOTE — Patient Instructions (Signed)
Urinary Tract Infection, Adult  A urinary tract infection (UTI) is an infection of any part of the urinary tract. The urinary tract includes the kidneys, ureters, bladder, and urethra. These organs make, store, and get rid of urine in the body. An upper UTI affects the ureters and kidneys. A lower UTI affects the bladder and urethra. What are the causes? Most urinary tract infections are caused by bacteria in your genital area around your urethra, where urine leaves your body. These bacteria grow and cause inflammation of your urinary tract. What increases the risk? You are more likely to develop this condition if: You have a urinary catheter that stays in place. You are not able to control when you urinate or have a bowel movement (incontinence). You are female and you: Use a spermicide or diaphragm for birth control. Have low estrogen levels. Are pregnant. You have certain genes that increase your risk. You are sexually active. You take antibiotic medicines. You have a condition that causes your flow of urine to slow down, such as: An enlarged prostate, if you are female. Blockage in your urethra. A kidney stone. A nerve condition that affects your bladder control (neurogenic bladder). Not getting enough to drink, or not urinating often. You have certain medical conditions, such as: Diabetes. A weak disease-fighting system (immunesystem). Sickle cell disease. Gout. Spinal cord injury. What are the signs or symptoms? Symptoms of this condition include: Needing to urinate right away (urgency). Frequent urination. This may include small amounts of urine each time you urinate. Pain or burning with urination. Blood in the urine. Urine that smells bad or unusual. Trouble urinating. Cloudy urine. Vaginal discharge, if you are female. Pain in the abdomen or the lower back. You may also have: Vomiting or a decreased appetite. Confusion. Irritability or tiredness. A fever or  chills. Diarrhea. The first symptom in older adults may be confusion. In some cases, they may not have any symptoms until the infection has worsened. How is this diagnosed? This condition is diagnosed based on your medical history and a physical exam. You may also have other tests, including: Urine tests. Blood tests. Tests for STIs (sexually transmitted infections). If you have had more than one UTI, a cystoscopy or imaging studies may be done to determine the cause of the infections. How is this treated? Treatment for this condition includes: Antibiotic medicine. Over-the-counter medicines to treat discomfort. Drinking enough water to stay hydrated. If you have frequent infections or have other conditions such as a kidney stone, you may need to see a health care provider who specializes in the urinary tract (urologist). In rare cases, urinary tract infections can cause sepsis. Sepsis is a life-threatening condition that occurs when the body responds to an infection. Sepsis is treated in the hospital with IV antibiotics, fluids, and other medicines. Follow these instructions at home:  Medicines Take over-the-counter and prescription medicines only as told by your health care provider. If you were prescribed an antibiotic medicine, take it as told by your health care provider. Do not stop using the antibiotic even if you start to feel better. General instructions Make sure you: Empty your bladder often and completely. Do not hold urine for long periods of time. Empty your bladder after sex. Wipe from front to back after urinating or having a bowel movement if you are female. Use each tissue only one time when you wipe. Drink enough fluid to keep your urine pale yellow. Keep all follow-up visits. This is important. Contact a health   care provider if: Your symptoms do not get better after 1-2 days. Your symptoms go away and then return. Get help right away if: You have severe pain in  your back or your lower abdomen. You have a fever or chills. You have nausea or vomiting. Summary A urinary tract infection (UTI) is an infection of any part of the urinary tract, which includes the kidneys, ureters, bladder, and urethra. Most urinary tract infections are caused by bacteria in your genital area. Treatment for this condition often includes antibiotic medicines. If you were prescribed an antibiotic medicine, take it as told by your health care provider. Do not stop using the antibiotic even if you start to feel better. Keep all follow-up visits. This is important. This information is not intended to replace advice given to you by your health care provider. Make sure you discuss any questions you have with your health care provider. Document Revised: 08/11/2019 Document Reviewed: 08/11/2019 Elsevier Patient Education  2023 [employer]  

## 2022-05-04 LAB — COMPREHENSIVE METABOLIC PANEL
ALT: 17 IU/L (ref 0–32)
AST: 26 IU/L (ref 0–40)
Albumin/Globulin Ratio: 1.7 (ref 1.2–2.2)
Albumin: 4.3 g/dL (ref 3.8–4.8)
Alkaline Phosphatase: 92 IU/L (ref 44–121)
BUN/Creatinine Ratio: 20 (ref 12–28)
BUN: 17 mg/dL (ref 8–27)
Bilirubin Total: 0.4 mg/dL (ref 0.0–1.2)
CO2: 22 mmol/L (ref 20–29)
Calcium: 9.3 mg/dL (ref 8.7–10.3)
Chloride: 104 mmol/L (ref 96–106)
Creatinine, Ser: 0.86 mg/dL (ref 0.57–1.00)
Globulin, Total: 2.5 g/dL (ref 1.5–4.5)
Glucose: 102 mg/dL — ABNORMAL HIGH (ref 70–99)
Potassium: 4.5 mmol/L (ref 3.5–5.2)
Sodium: 141 mmol/L (ref 134–144)
Total Protein: 6.8 g/dL (ref 6.0–8.5)
eGFR: 70 mL/min/{1.73_m2} (ref >59)

## 2022-05-04 LAB — HEMOGLOBIN A1C
Est. average glucose Bld gHb Est-mCnc: 123 mg/dL
Hgb A1c MFr Bld: 5.9 % — ABNORMAL HIGH (ref 4.8–5.6)

## 2022-05-04 LAB — ALPHA-GAL PANEL
Allergen Lamb IgE: <0.1 kU/L
Beef IgE: <0.1 kU/L
IgE (Immunoglobulin E), Serum: 56 IU/mL (ref 6–495)
O215-IgE Alpha-Gal: <0.1 kU/L
Pork IgE: <0.1 kU/L

## 2022-05-04 LAB — AFP TUMOR MARKER: AFP, Serum, Tumor Marker: 6.1 ng/mL (ref 0.0–9.2)

## 2022-05-04 LAB — CBC WITH DIFFERENTIAL/PLATELET
Basophils Absolute: 0 10*3/uL (ref 0.0–0.2)
Basos: 1 %
EOS (ABSOLUTE): 0.2 10*3/uL (ref 0.0–0.4)
Eos: 5 %
Hematocrit: 40.8 % (ref 34.0–46.6)
Hemoglobin: 13.3 g/dL (ref 11.1–15.9)
Immature Grans (Abs): 0 10*3/uL (ref 0.0–0.1)
Immature Granulocytes: 0 %
Lymphocytes Absolute: 2.3 10*3/uL (ref 0.7–3.1)
Lymphs: 52 %
MCH: 30.8 pg (ref 26.6–33.0)
MCHC: 32.6 g/dL (ref 31.5–35.7)
MCV: 94 fL (ref 79–97)
Monocytes Absolute: 0.5 10*3/uL (ref 0.1–0.9)
Monocytes: 11 %
Neutrophils Absolute: 1.3 10*3/uL — ABNORMAL LOW (ref 1.4–7.0)
Neutrophils: 31 %
Platelets: 234 10*3/uL (ref 150–450)
RBC: 4.32 x10E6/uL (ref 3.77–5.28)
RDW: 12.9 % (ref 11.7–15.4)
WBC: 4.3 10*3/uL (ref 3.4–10.8)

## 2022-05-04 LAB — PROTIME-INR
INR: 1 (ref 0.9–1.2)
Prothrombin Time: 11.2 s (ref 9.1–12.0)

## 2022-05-04 LAB — TISSUE TRANSGLUTAMINASE, IGA: Transglutaminase IgA: <2 U/mL (ref 0–3)

## 2022-05-07 ENCOUNTER — Telehealth: Payer: Self-pay

## 2022-05-07 NOTE — Telephone Encounter (Signed)
Pt called wanting to know her results to recent lab work.

## 2022-05-08 NOTE — Telephone Encounter (Signed)
See result note.  

## 2022-05-22 ENCOUNTER — Other Ambulatory Visit: Payer: Self-pay | Admitting: *Deleted

## 2022-05-22 ENCOUNTER — Encounter: Payer: Self-pay | Admitting: Internal Medicine

## 2022-05-22 ENCOUNTER — Ambulatory Visit: Payer: Medicare PPO | Admitting: Internal Medicine

## 2022-05-22 VITALS — BP 136/77 | HR 73 | Temp 97.9°F | Ht 61.0 in | Wt 151.8 lb

## 2022-05-22 DIAGNOSIS — K5900 Constipation, unspecified: Secondary | ICD-10-CM | POA: Diagnosis not present

## 2022-05-22 DIAGNOSIS — R1033 Periumbilical pain: Secondary | ICD-10-CM

## 2022-05-22 DIAGNOSIS — K219 Gastro-esophageal reflux disease without esophagitis: Secondary | ICD-10-CM

## 2022-05-22 NOTE — Patient Instructions (Signed)
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  You should have it evaluated by general surgeon.  At your request, we will send you to Dr. Franky Macho to get his opinion regarding management.

## 2022-05-22 NOTE — Progress Notes (Unsigned)
Primary Care Physician:  Kirstie Peri, MD Primary Gastroenterologist:  Dr. Jena Gauss  Pre-Procedure History & Physical: HPI:  Connie Kent is a 77 y.o. female here for follow-up of chronic abdominal pain.  Pain chronically for at least a couple of years.  Vaguely worsened by eating.  No change with bowel function.  CTA demonstrated left renal lesion which is being followed closely Dr. Ronne Binning.  Mesenteric vasculature wide open.  GERD well-controlled on pantoprazole.  H. pylori infection treated and eradicated.  Gallbladder gone.  Recent addition of MiraLAX has not improved abdominal pain although it has improved constipation.  Colonoscopy earlier this year yielded a couple of small tubular adenomas removed; Future colonoscopy not recommended.  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  No evidence of portal hypertension.  Small umbilical hernia noted on CT containing fat.  Small ventral hernia just above periumbilical hernia.  Past Medical History:  Diagnosis Date   Diabetes mellitus without complication (HCC)    GERD (gastroesophageal reflux disease)    H. pylori infection    Confirmed eradication on EGD in September 2022   Hyperlipidemia    Hypertension    Thyroid disease     Past Surgical History:  Procedure Laterality Date   ABDOMINAL HYSTERECTOMY     BIOPSY  09/23/2020   Procedure: BIOPSY;  Surgeon: Corbin Ade, MD;  Location: [facility];  Service: Endoscopy;;   breast biopsies     BREAST BIOPSY Bilateral    beghin   CHOLECYSTECTOMY     COLONOSCOPY  06/02/1999   Dr. Jena Gauss; internal and external hemorrhoids, poor prep.   COLONOSCOPY  09/08/2002   Dr. Jena Gauss; friable internal hemorrhoids, scattered left-sided diverticula, otherwise normal colon and TI.   COLONOSCOPY WITH PROPOFOL N/A 02/25/2022   Procedure: COLONOSCOPY WITH PROPOFOL;  Surgeon: Corbin Ade, MD;  Location: [facility];  Service: Endoscopy;   Laterality: N/A;  7:30 am   ESOPHAGOGASTRODUODENOSCOPY  2003   Dr. Jena Gauss; normal esophagus s/p empiric dilation, superficial antral erosions.   ESOPHAGOGASTRODUODENOSCOPY  09/08/2002   Small hiatal hernia, otherwise normal.   ESOPHAGOGASTRODUODENOSCOPY  08/22/2007   Dr. Jena Gauss; normal esophagus s/p empiric dilation, small hiatal hernia, benign-appearing polypoid antral mucosa, otherwise normal.   ESOPHAGOGASTRODUODENOSCOPY (EGD) WITH PROPOFOL N/A 09/23/2020   Procedure: ESOPHAGOGASTRODUODENOSCOPY (EGD) WITH PROPOFOL;  Surgeon: Corbin Ade, MD;  Location: [facility];  Service: Endoscopy;  Laterality: N/A;  7:30am   EYE SURGERY     HEMORRHOID SURGERY     MALONEY DILATION N/A 09/23/2020   Procedure: Elease Hashimoto DILATION;  Surgeon: Corbin Ade, MD;  Location: [facility];  Service: Endoscopy;  Laterality: N/A;   POLYPECTOMY  02/25/2022   Procedure: POLYPECTOMY;  Surgeon: Corbin Ade, MD;  Location: [facility];  Service: Endoscopy;;   THROAT SURGERY     Biopsy    Prior to Admission medications   Medication Sig Start Date End Date Taking? Authorizing Provider  acetaminophen (TYLENOL) 500 MG tablet Take 1,000 mg by mouth daily as needed for moderate pain.   Yes [provider]  cholecalciferol (VITAMIN D3) 25 MCG (1000 UNIT) tablet Take 1,000 Units by mouth daily.   Yes [provider]  Cyanocobalamin (B-12) 2500 MCG TABS Take 2,500 mcg by mouth daily.   Yes [provider]  FLUoxetine (PROZAC) 20 MG capsule Take 20 mg by mouth daily. 05/04/20  Yes [provider]  hydrochlorothiazide (HYDRODIURIL) 25 MG tablet  Take 25 mg by mouth daily. 03/26/21  Yes [provider]  HYDROcodone-acetaminophen (NORCO/VICODIN) 5-325 MG tablet Take 0.5-1 tablets by mouth 2 (two) times daily as needed for severe pain. 04/19/20  Yes [provider]  hydrocortisone (ANUSOL-HC) 2.5 % rectal cream Place 1 application. rectally 2 (two) times daily. Patient  taking differently: Place 1 application  rectally 2 (two) times daily as needed for hemorrhoids. 06/17/21  Yes Letta Median, PA-C  levothyroxine (SYNTHROID) 75 MCG tablet Take 75 mcg by mouth daily before breakfast.   Yes [provider]  meclizine (DRAMAMINE) 25 MG tablet Take 25 mg by mouth 3 (three) times daily as needed for nausea.   Yes [provider]  metFORMIN (GLUCOPHAGE) 500 MG tablet Take 500 mg by mouth daily. 12/30/21  Yes [provider]  metoprolol tartrate (LOPRESSOR) 25 MG tablet Take 25 mg by mouth daily. 06/04/16  Yes [provider]  Multiple Vitamins-Minerals (HAIR SKIN AND NAILS FORMULA PO) Take 1 tablet by mouth daily.   Yes [provider]  Omega-3 Fatty Acids (FISH OIL PO) Take 700 mg by mouth daily.   Yes [provider]  pantoprazole (PROTONIX) 40 MG tablet Take 1 tablet (40 mg total) by mouth daily. 04/20/22  Yes Martyna Thorns, Gerrit Friends, MD  penicillin v potassium (VEETID) 500 MG tablet Take 500 mg by mouth daily. 01/07/22  Yes [provider]  predniSONE (DELTASONE) 5 MG tablet Take 5 mg by mouth daily as needed (back pain).   Yes [provider]  vitamin E 180 MG (400 UNITS) capsule Take 400 Units by mouth daily.   Yes [provider]    Allergies as of 05/22/2022 - Review Complete 05/22/2022  Allergen Reaction Noted   Cyclobenzaprine  03/12/2016   Tramadol Other (See Comments) 03/12/2016    Family History  Problem Relation Age of Onset   Cancer Mother    Heart failure Father     Social History   Socioeconomic History   Marital status: Married    Spouse name: Not on file   Number of children: 4   Years of education: Not on file   Highest education level: Not on file  Occupational History   Occupation: Retired  Tobacco Use   Smoking status: Former    Types: Cigarettes    Passive exposure: Past   Smokeless tobacco: Never  Building services engineer Use: Never used  Substance and  Sexual Activity   Alcohol use: No   Drug use: No   Sexual activity: Not Currently  Other Topics Concern   Not on file  Social History Narrative   Not on file   Social Determinants of Health   Financial Resource Strain: Not on file  Food Insecurity: Not on file  Transportation Needs: Not on file  Physical Activity: Not on file  Stress: Not on file  Social Connections: Not on file  Intimate Partner Violence: Not on file    Review of Systems: See HPI, otherwise negative ROS  Physical Exam: BP 136/77 (BP Location: Right Arm, Patient Position: Sitting, Cuff Size: Large)   Pulse 73   Temp 97.9 F (36.6 C) (Oral)   Ht 5\' 1"  (1.549 m)   Wt 151 lb 12.8 oz (68.9 kg)   SpO2 97%   BMI 28.68 kg/m  General:   Alert,  Well-developed, well-nourished, pleasant and cooperative in NAD Neck:  Supple; no masses or thyromegaly. No significant cervical adenopathy. Lungs:  Clear throughout to auscultation.  No wheezes, crackles, or rhonchi. No acute distress. Heart:  Regular rate and rhythm; no murmurs, clicks, rubs,  or gallops. Abdomen: Nondistended.  Multiple surgical scars.  Patient has localized abdominal tenderness around her umbilicus.  Focally tender here.'s  -  states this is where the pain is located.    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  Presents today again with chronic abdominal pain GERD well-controlled on PPI.  Constipation well-managed on MiraLAX  Left renal mass followed closely by Dr. Ronne Binning.  Patient appears to have abdominal pain emanating from her periumbilical area.  Small hernia noted here on CT.  Is reassuring that her CTA reveals no significant mesenteric ischemia.  It appears that her abdominal pain is originating from her abdominal wall.  Recommendations:  Continue Protonix 40 mg daily 30 minutes before breakfast  MiraLAX daily to facilitate good bowel  function  Peri-umbilical pain should have it evaluated by general surgeon.  At patient request, we will send her to Dr. Franky Macho to get his opinion regarding management.  Hepatic ultrasound every 6 months  Further recommendations to follow.      Notice: This dictation was prepared with Dragon dictation along with smaller phrase technology. Any transcriptional errors that result from this process are unintentional and may not be corrected upon review.

## 2022-05-27 DIAGNOSIS — I10 Essential (primary) hypertension: Secondary | ICD-10-CM | POA: Diagnosis not present

## 2022-05-27 DIAGNOSIS — M549 Dorsalgia, unspecified: Secondary | ICD-10-CM | POA: Diagnosis not present

## 2022-05-27 DIAGNOSIS — R079 Chest pain, unspecified: Secondary | ICD-10-CM | POA: Diagnosis not present

## 2022-05-27 DIAGNOSIS — K429 Umbilical hernia without obstruction or gangrene: Secondary | ICD-10-CM | POA: Diagnosis not present

## 2022-05-27 DIAGNOSIS — Z299 Encounter for prophylactic measures, unspecified: Secondary | ICD-10-CM | POA: Diagnosis not present

## 2022-06-11 ENCOUNTER — Encounter: Payer: Self-pay | Admitting: General Surgery

## 2022-06-11 ENCOUNTER — Ambulatory Visit: Payer: Medicare PPO | Admitting: General Surgery

## 2022-06-11 ENCOUNTER — Other Ambulatory Visit: Payer: Self-pay

## 2022-06-11 VITALS — BP 137/81 | HR 67 | Temp 97.8°F | Resp 18 | Ht 61.0 in | Wt 153.0 lb

## 2022-06-11 DIAGNOSIS — K439 Ventral hernia without obstruction or gangrene: Secondary | ICD-10-CM | POA: Diagnosis not present

## 2022-06-12 DIAGNOSIS — E1165 Type 2 diabetes mellitus with hyperglycemia: Secondary | ICD-10-CM | POA: Diagnosis not present

## 2022-06-12 NOTE — Progress Notes (Signed)
Connie Kent; [id number]; 1945-07-28   HPI Patient is a 77 year old white female who was referred to my care by Drs. Sherryll Burger and Rourk for evaluation and treatment of a ventral hernia.  Patient has had a longstanding history of intermittent vague abdominal pain.  She states that it is made worse with straining.  She does have a 3 cm umbilical hernia as well as an additional fat filled supraumbilical hernia.  She has had extensive workup for her abdominal pain in the past which has been for the most part unremarkable. Past Medical History:  Diagnosis Date   Diabetes mellitus without complication (HCC)    GERD (gastroesophageal reflux disease)    H. pylori infection    Confirmed eradication on EGD in September 2022   Hyperlipidemia    Hypertension    Thyroid disease     Past Surgical History:  Procedure Laterality Date   ABDOMINAL HYSTERECTOMY     BIOPSY  09/23/2020   Procedure: BIOPSY;  Surgeon: Corbin Ade, MD;  Location: [facility];  Service: Endoscopy;;   breast biopsies     BREAST BIOPSY Bilateral    beghin   CHOLECYSTECTOMY     COLONOSCOPY  06/02/1999   Dr. Jena Gauss; internal and external hemorrhoids, poor prep.   COLONOSCOPY  09/08/2002   Dr. Jena Gauss; friable internal hemorrhoids, scattered left-sided diverticula, otherwise normal colon and TI.   COLONOSCOPY WITH PROPOFOL N/A 02/25/2022   Procedure: COLONOSCOPY WITH PROPOFOL;  Surgeon: Corbin Ade, MD;  [facility]: [facility];  Service: Endoscopy;  Laterality: N/A;  7:30 am   ESOPHAGOGASTRODUODENOSCOPY  2003   Dr. Jena Gauss; normal esophagus s/p empiric dilation, superficial antral erosions.   ESOPHAGOGASTRODUODENOSCOPY  09/08/2002   Small hiatal hernia, otherwise normal.   ESOPHAGOGASTRODUODENOSCOPY  08/22/2007   Dr. Jena Gauss; normal esophagus s/p empiric dilation, small hiatal hernia, benign-appearing polypoid antral mucosa, otherwise normal.   ESOPHAGOGASTRODUODENOSCOPY (EGD) WITH PROPOFOL N/A 09/23/2020   Procedure:  ESOPHAGOGASTRODUODENOSCOPY (EGD) WITH PROPOFOL;  Surgeon: Corbin Ade, MD;  [facility]: [facility];  Service: Endoscopy;  Laterality: N/A;  7:30am   EYE SURGERY     HEMORRHOID SURGERY     MALONEY DILATION N/A 09/23/2020   Procedure: Elease Hashimoto DILATION;  Surgeon: Corbin Ade, MD;  [facility]: [facility];  Service: Endoscopy;  Laterality: N/A;   POLYPECTOMY  02/25/2022   Procedure: POLYPECTOMY;  Surgeon: Corbin Ade, MD;  [facility]: [facility];  Service: Endoscopy;;   THROAT SURGERY     Biopsy    Family History  Problem Relation Age of Onset   Cancer Mother    Heart failure Father     Current Outpatient Medications on File Prior to Visit  Medication Sig Dispense Refill   acetaminophen (TYLENOL) 500 MG tablet Take 1,000 mg by mouth daily as needed for moderate pain.     Biotin 10 MG TABS Take 1 tablet by mouth daily.     Cyanocobalamin (B-12) 2500 MCG TABS Take 2,500 mcg by mouth every other day.     FLUoxetine (PROZAC) 20 MG capsule Take 20 mg by mouth daily.     hydrochlorothiazide (HYDRODIURIL) 25 MG tablet Take 25 mg by mouth daily.     HYDROcodone-acetaminophen (NORCO/VICODIN) 5-325 MG tablet Take 0.5-1 tablets by mouth 2 (two) times daily as needed for severe pain.     hydrocortisone (ANUSOL-HC) 2.5 % rectal cream Place 1 application. rectally 2 (two) times daily. (Patient taking differently: Place 1 application  rectally 2 (two) times daily as  needed for hemorrhoids.) 30 g 1   levothyroxine (SYNTHROID) 88 MCG tablet Take 88 mcg by mouth daily.     meclizine (DRAMAMINE) 25 MG tablet Take 25 mg by mouth 3 (three) times daily as needed for nausea.     metFORMIN (GLUCOPHAGE) 500 MG tablet Take 500 mg by mouth daily.     metoprolol tartrate (LOPRESSOR) 25 MG tablet Take 25 mg by mouth daily.     pantoprazole (PROTONIX) 40 MG tablet Take 1 tablet (40 mg total) by mouth daily. 90 tablet 1   penicillin v potassium (VEETID) 500 MG tablet Take 500 mg by mouth daily.      predniSONE (DELTASONE) 5 MG tablet Take 5 mg by mouth daily as needed (back pain).     vitamin E 180 MG (400 UNITS) capsule Take 400 Units by mouth every other day.     No current facility-administered medications on file prior to visit.    Allergies  Allergen Reactions   Cyclobenzaprine     sick   Tramadol Other (See Comments)    sick    Social History   Substance and Sexual Activity  Alcohol Use No    Social History   Tobacco Use  Smoking Status Former   Types: Cigarettes   Passive exposure: Past  Smokeless Tobacco Never    Review of Systems  Constitutional:  Positive for malaise/fatigue.  HENT: Negative.    Eyes: Negative.   Respiratory:  Positive for cough.   Cardiovascular: Negative.   Gastrointestinal:  Positive for abdominal pain and nausea.  Genitourinary:  Positive for frequency.  Musculoskeletal:  Positive for back pain, joint pain and neck pain.  Skin: Negative.   Neurological: Negative.   Endo/Heme/Allergies: Negative.   Psychiatric/Behavioral: Negative.      Objective   Vitals:   06/11/22 1018  BP: 137/81  Pulse: 67  Resp: 18  Temp: 97.8 F (36.6 C)  SpO2: 95%    Physical Exam Vitals reviewed.  Constitutional:      Appearance: Normal appearance. She is not ill-appearing.  HENT:     Head: Normocephalic and atraumatic.  Cardiovascular:     Rate and Rhythm: Normal rate and regular rhythm.     Heart sounds: Normal heart sounds. No murmur heard.    No friction rub. No gallop.  Pulmonary:     Effort: Pulmonary effort is normal. No respiratory distress.     Breath sounds: Normal breath sounds. No stridor. No wheezing, rhonchi or rales.  Abdominal:     General: Bowel sounds are normal. There is no distension.     Palpations: Abdomen is soft. There is no mass.     Tenderness: There is abdominal tenderness. There is no guarding or rebound.     Hernia: A hernia is present.     Comments: Tender over umbilical hernia with the palpable  subcutaneous fat pad supraumbilical.  3 cm umbilical hernia appreciated.  Skin:    General: Skin is warm and dry.  Neurological:     Mental Status: She is alert and oriented to person, place, and time.    Previous records reviewed Assessment  Ventral hernia Plan  I told the patient that I could not assure her that fixing the hernias would resolve all her pain.  She understands this and would like to proceed with surgery.  Patient is scheduled for robotic assisted laparoscopic ventral herniorrhaphy with mesh on 07/31/2022.  The risks and benefits of the procedure including bleeding, infection, mesh use, bowel injury,  and the possibility of an open procedure were fully explained to the patient, who gave informed consent.

## 2022-07-03 ENCOUNTER — Telehealth: Payer: Self-pay | Admitting: Cardiovascular Disease

## 2022-07-03 NOTE — Telephone Encounter (Signed)
  Pt c/o of Chest Pain: STAT if CP now or developed within 24 hours  1. Are you having CP right now? No arm pain right now or chest pain   2. Are you experiencing any other symptoms (ex. SOB, nausea, vomiting, sweating)? no  3. How long have you been experiencing CP? Arm pain started last week.   4. Is your CP continuous or coming and going? It comes and goes.   5. Have you taken Nitroglycerin? no  Patient states she has arm pain to both arms. She states it started last week. She states when she gets the arm pain her chest hurts as well.  She takes tylenol and it eases the pain.   ?

## 2022-07-03 NOTE — Telephone Encounter (Signed)
Patient states chest pan for a week and half. None at this time.  She states last night at dinner.  Feels it "coming on" arm hurts real bad.  Shoulder and works it way down and neck will hurt. Took 2 tylenol and 10-15 minutes will ease off but doesn't seem to go away. She has nitroglycerin but is old so advised not to take it. . Patient has stress with husband having Dementia.  She is alone most of the time and "things closing up" .  When has pain she has nausea and sweating with episode. It has happened at rest and with minimal exertion. Burning in chest as well like heartburn.  Advised with these symptoms she should be evaluated at ED.  She is at home with her husband and he cannot be left alone. She will call her daughter and have her come sit with husband so she can be assessed at ED

## 2022-07-07 NOTE — Patient Instructions (Signed)
Your procedure is scheduled on: 07/14/2022  Report to [facility] Main Entrance at 10:00    AM.  Call this number if you have problems the morning of surgery: [phone]   Remember:   Do not Eat or Drink after midnight         No Smoking the morning of surgery  :  Take these medicines the morning of surgery with A SIP OF WATER: Prozac, levothroxine, metoprolol, and hydrocodone if needed   Do not wear jewelry, make-up or nail polish.  Do not wear lotions, powders, or perfumes. You may wear deodorant.  Do not shave 48 hours prior to surgery. Men may shave face and neck.  Do not bring valuables to the hospital.  Contacts, dentures or bridgework may not be worn into surgery.  Leave suitcase in the car. After surgery it may be brought to your room.  For patients admitted to the hospital, checkout time is 11:00 AM the day of discharge.   Patients discharged the day of surgery will not be allowed to drive home.    Special Instructions: Shower using CHG night before surgery and shower the day of surgery use CHG.  Use special wash - you have one bottle of CHG for all showers.  You should use approximately 1/2 of the bottle for each shower. How to Use Chlorhexidine Before Surgery Chlorhexidine gluconate (CHG) is a germ-killing (antiseptic) solution that is used to clean the skin. It can get rid of the bacteria that normally live on the skin and can keep them away for about 24 hours. To clean your skin with CHG, you may be given: A CHG solution to use in the shower or as part of a sponge bath. A prepackaged cloth that contains CHG. Cleaning your skin with CHG may help lower the risk for infection: While you are staying in the intensive care unit of the hospital. If you have a vascular access, such as a central line, to provide short-term or long-term access to your veins. If you have a catheter to drain urine from your bladder. If you are on a ventilator. A ventilator is a machine that helps  you breathe by moving air in and out of your lungs. After surgery. What are the risks? Risks of using CHG include: A skin reaction. Hearing loss, if CHG gets in your ears and you have a perforated eardrum. Eye injury, if CHG gets in your eyes and is not rinsed out. The CHG product catching fire. Make sure that you avoid smoking and flames after applying CHG to your skin. Do not use CHG: If you have a chlorhexidine allergy or have previously reacted to chlorhexidine. On babies younger than 59 months of age. How to use CHG solution Use CHG only as told by your health care provider, and follow the instructions on the label. Use the full amount of CHG as directed. Usually, this is one bottle. During a shower Follow these steps when using CHG solution during a shower (unless your health care provider gives you different instructions): Start the shower. Use your normal soap and shampoo to wash your face and hair. Turn off the shower or move out of the shower stream. Pour the CHG onto a clean washcloth. Do not use any type of brush or rough-edged sponge. Starting at your neck, lather your body down to your toes. Make sure you follow these instructions: If you will be having surgery, pay special attention to the part of your body where you  will be having surgery. Scrub this area for at least 1 minute. Do not use CHG on your head or face. If the solution gets into your ears or eyes, rinse them well with water. Avoid your genital area. Avoid any areas of skin that have broken skin, cuts, or scrapes. Scrub your back and under your arms. Make sure to wash skin folds. Let the lather sit on your skin for 1-2 minutes or as long as told by your health care provider. Thoroughly rinse your entire body in the shower. Make sure that all body creases and crevices are rinsed well. Dry off with a clean towel. Do not put any substances on your body afterward--such as powder, lotion, or perfume--unless you are  told to do so by your health care provider. Only use lotions that are recommended by the manufacturer. Put on clean clothes or pajamas. If it is the night before your surgery, sleep in clean sheets.  During a sponge bath Follow these steps when using CHG solution during a sponge bath (unless your health care provider gives you different instructions): Use your normal soap and shampoo to wash your face and hair. Pour the CHG onto a clean washcloth. Starting at your neck, lather your body down to your toes. Make sure you follow these instructions: If you will be having surgery, pay special attention to the part of your body where you will be having surgery. Scrub this area for at least 1 minute. Do not use CHG on your head or face. If the solution gets into your ears or eyes, rinse them well with water. Avoid your genital area. Avoid any areas of skin that have broken skin, cuts, or scrapes. Scrub your back and under your arms. Make sure to wash skin folds. Let the lather sit on your skin for 1-2 minutes or as long as told by your health care provider. Using a different clean, wet washcloth, thoroughly rinse your entire body. Make sure that all body creases and crevices are rinsed well. Dry off with a clean towel. Do not put any substances on your body afterward--such as powder, lotion, or perfume--unless you are told to do so by your health care provider. Only use lotions that are recommended by the manufacturer. Put on clean clothes or pajamas. If it is the night before your surgery, sleep in clean sheets. How to use CHG prepackaged cloths Only use CHG cloths as told by your health care provider, and follow the instructions on the label. Use the CHG cloth on clean, dry skin. Do not use the CHG cloth on your head or face unless your health care provider tells you to. When washing with the CHG cloth: Avoid your genital area. Avoid any areas of skin that have broken skin, cuts, or  scrapes. Before surgery Follow these steps when using a CHG cloth to clean before surgery (unless your health care provider gives you different instructions): Using the CHG cloth, vigorously scrub the part of your body where you will be having surgery. Scrub using a back-and-forth motion for 3 minutes. The area on your body should be completely wet with CHG when you are done scrubbing. Do not rinse. Discard the cloth and let the area air-dry. Do not put any substances on the area afterward, such as powder, lotion, or perfume. Put on clean clothes or pajamas. If it is the night before your surgery, sleep in clean sheets.  For general bathing Follow these steps when using CHG cloths for general bathing (  unless your health care provider gives you different instructions). Use a separate CHG cloth for each area of your body. Make sure you wash between any folds of skin and between your fingers and toes. Wash your body in the following order, switching to a new cloth after each step: The front of your neck, shoulders, and chest. Both of your arms, under your arms, and your hands. Your stomach and groin area, avoiding the genitals. Your right leg and foot. Your left leg and foot. The back of your neck, your back, and your buttocks. Do not rinse. Discard the cloth and let the area air-dry. Do not put any substances on your body afterward--such as powder, lotion, or perfume--unless you are told to do so by your health care provider. Only use lotions that are recommended by the manufacturer. Put on clean clothes or pajamas. Contact a health care provider if: Your skin gets irritated after scrubbing. You have questions about using your solution or cloth. You swallow any chlorhexidine. Call your local poison control center [phone] in the [place] Get help right away if: Your eyes itch badly, or they become very red or swollen. Your skin itches badly and is red or swollen. Your hearing  changes. You have trouble seeing. You have swelling or tingling in your mouth or throat. You have trouble breathing. These symptoms may represent a serious problem that is an emergency. Do not wait to see if the symptoms will go away. Get medical help right away. Call your local emergency services (911 in the [place] Do not drive yourself to the hospital. Summary Chlorhexidine gluconate (CHG) is a germ-killing (antiseptic) solution that is used to clean the skin. Cleaning your skin with CHG may help to lower your risk for infection. You may be given CHG to use for bathing. It may be in a bottle or in a prepackaged cloth to use on your skin. Carefully follow your health care provider's instructions and the instructions on the product label. Do not use CHG if you have a chlorhexidine allergy. Contact your health care provider if your skin gets irritated after scrubbing. This information is not intended to replace advice given to you by your health care provider. Make sure you discuss any questions you have with your health care provider. Document Revised: 04/28/2021 Document Reviewed: 03/11/2020 Elsevier Patient Education  2023 [employer] Laparoscopic Ventral Hernia Repair, Care After The following information offers guidance on how to care for yourself after your procedure. Your health care provider may also give you more specific instructions. If you have problems or questions, contact your health care provider. What can I expect after the procedure? After the procedure, it is common to have pain, discomfort, or soreness. Follow these instructions at home: Medicines Take over-the-counter and prescription medicines only as told by your health care provider. Ask your health care provider if the medicine prescribed to you: Requires you to avoid driving or using machinery. Can cause constipation. You may need to take these actions to prevent or treat constipation: Drink enough fluid to keep  your urine pale yellow. Take over-the-counter or prescription medicines. Eat foods that are high in fiber, such as beans, whole grains, and fresh fruits and vegetables. Limit foods that are high in fat and processed sugars, such as fried or sweet foods. Incision care  Follow instructions from your health care provider about how to take care of your incisions. Make sure you: Wash your hands with soap and water for at least 20 seconds before  and after you change your bandage (dressing) or before you touch your abdomen. If soap and water are not available, use hand sanitizer. Change your dressing as told by your health care provider. Leave stitches (sutures), skin glue, or adhesive strips in place. These skin closures may need to stay in place for 2 weeks or longer. If adhesive strip edges start to loosen and curl up, you may trim the loose edges. Do not remove adhesive strips completely unless your health care provider tells you to do that. Check your incision areas every day for signs of infection. Check for: More redness, swelling, or pain. Fluid or blood. Warmth. Pus or a bad smell. Bathing  Do not take baths, swim, or use a hot tub until your health care provider approves. Ask your health care provider if you may take showers. You may only be allowed to take sponge baths. Keep your dressing dry until your health care provider says it can be removed. Activity  Rest as told by your health care provider. Avoid sitting for a long time without moving. Get up to take short walks every 1-2 hours. This is important to improve blood flow and breathing. Ask for help if you feel weak or unsteady. Do not lift anything that is heavier than 10 lb (4.5 kg), or the limit that you are told, until your health care provider says that it is safe. If you were given a sedative during the procedure, it can affect you for several hours. Do not drive or operate machinery until your health care provider says that it  is safe. Return to your normal activities as told by your health care provider. Ask your health care provider what activities are safe for you. General instructions  Hold a pillow over your abdomen when you cough or sneeze. This helps with pain. Do not use any products that contain nicotine or tobacco. These products include cigarettes, chewing tobacco, and vaping devices, such as e-cigarettes. These can delay healing after surgery. If you need help quitting, ask your health care provider. You may be asked to continue to do deep breathing exercises at home. This will help to prevent a lung infection. Keep all follow-up visits. This is important. Contact a health care provider if: You have any of these signs of infection: More redness, swelling, or pain around an incision. Fluid or blood coming from an incision. Warmth coming from an incision. Pus or a bad smell coming from an incision. A fever or chills. You have pain that gets worse or does not get better with medicine. You have nausea or vomiting. You have a cough. You have shortness of breath. You have not had a bowel movement in 3 days. You are not able to urinate. Get help right away if you have: Severe pain in your abdomen. Persistent nausea and vomiting. Redness, warmth, or pain in your leg. Chest pain. Trouble breathing. These symptoms may represent a serious problem that is an emergency. Do not wait to see if the symptoms will go away. Get medical help right away. Call your local emergency services (911 in the [place] Do not drive yourself to the hospital. Summary After this procedure, it is common to have pain, discomfort, or soreness. Follow instructions from your health care provider about how to take care of your incision. Check your incision area every day for signs of infection. Report any signs of infection to your health care provider. Keep all follow-up visits. This is important. This information is not intended  to  replace advice given to you by your health care provider. Make sure you discuss any questions you have with your health care provider. Document Revised: 08/18/2019 Document Reviewed: 08/18/2019 Elsevier Patient Education  2024 Else[employer] Inc. General Anesthesia, Adult, Care After The following information offers guidance on how to care for yourself after your procedure. Your health care provider may also give you more specific instructions. If you have problems or questions, contact your health care provider. What can I expect after the procedure? After the procedure, it is common for people to: Have pain or discomfort at the IV site. Have nausea or vomiting. Have a sore throat or hoarseness. Have trouble concentrating. Feel cold or chills. Feel weak, sleepy, or tired (fatigue). Have soreness and body aches. These can affect parts of the body that were not involved in surgery. Follow these instructions at home: For the time period you were told by your health care provider:  Rest. Do not participate in activities where you could fall or become injured. Do not drive or use machinery. Do not drink alcohol. Do not take sleeping pills or medicines that cause drowsiness. Do not make important decisions or sign legal documents. Do not take care of children on your own. General instructions Drink enough fluid to keep your urine pale yellow. If you have sleep apnea, surgery and certain medicines can increase your risk for breathing problems. Follow instructions from your health care provider about wearing your sleep device: Anytime you are sleeping, including during daytime naps. While taking prescription pain medicines, sleeping medicines, or medicines that make you drowsy. Return to your normal activities as told by your health care provider. Ask your health care provider what activities are safe for you. Take over-the-counter and prescription medicines only as told by your health care  provider. Do not use any products that contain nicotine or tobacco. These products include cigarettes, chewing tobacco, and vaping devices, such as e-cigarettes. These can delay incision healing after surgery. If you need help quitting, ask your health care provider. Contact a health care provider if: You have nausea or vomiting that does not get better with medicine. You vomit every time you eat or drink. You have pain that does not get better with medicine. You cannot urinate or have bloody urine. You develop a skin rash. You have a fever. Get help right away if: You have trouble breathing. You have chest pain. You vomit blood. These symptoms may be an emergency. Get help right away. Call 911. Do not wait to see if the symptoms will go away. Do not drive yourself to the hospital. Summary After the procedure, it is common to have a sore throat, hoarseness, nausea, vomiting, or to feel weak, sleepy, or fatigue. For the time period you were told by your health care provider, do not drive or use machinery. Get help right away if you have difficulty breathing, have chest pain, or vomit blood. These symptoms may be an emergency. This information is not intended to replace advice given to you by your health care provider. Make sure you discuss any questions you have with your health care provider. Document Revised: 03/28/2021 Document Reviewed: 03/28/2021 Elsevier Patient Education  2024 [employer].

## 2022-07-08 NOTE — H&P (Signed)
Connie Kent; [id number]; 01/16/1945   HPI Patient is a 77-year-old white female who was referred to my care by Drs. Shah and Rourk for evaluation and treatment of a ventral hernia.  Patient has had a longstanding history of intermittent vague abdominal pain.  She states that it is made worse with straining.  She does have a 3 cm umbilical hernia as well as an additional fat filled supraumbilical hernia.  She has had extensive workup for her abdominal pain in the past which has been for the most part unremarkable. Past Medical History:  Diagnosis Date   Diabetes mellitus without complication (HCC)    GERD (gastroesophageal reflux disease)    H. pylori infection    Confirmed eradication on EGD in September 2022   Hyperlipidemia    Hypertension    Thyroid disease     Past Surgical History:  Procedure Laterality Date   ABDOMINAL HYSTERECTOMY     BIOPSY  09/23/2020   Procedure: BIOPSY;  Surgeon: Rourk, Robert M, MD;  Location: [facility];  Service: Endoscopy;;   breast biopsies     BREAST BIOPSY Bilateral    [place]   CHOLECYSTECTOMY     COLONOSCOPY  06/02/1999   Dr. Rourk; internal and external hemorrhoids, poor prep.   COLONOSCOPY  09/08/2002   Dr. Rourk; friable internal hemorrhoids, scattered left-sided diverticula, otherwise normal colon and TI.   COLONOSCOPY WITH PROPOFOL N/A 02/25/2022   Procedure: COLONOSCOPY WITH PROPOFOL;  Surgeon: Rourk, Robert M, MD;  Location: [facility];  Service: Endoscopy;  Laterality: N/A;  7:30 am   ESOPHAGOGASTRODUODENOSCOPY  2003   Dr. Rourk; normal esophagus s/p empiric dilation, superficial antral erosions.   ESOPHAGOGASTRODUODENOSCOPY  09/08/2002   Small hiatal hernia, otherwise normal.   ESOPHAGOGASTRODUODENOSCOPY  08/22/2007   Dr. Rourk; normal esophagus s/p empiric dilation, small hiatal hernia, benign-appearing polypoid antral mucosa, otherwise normal.   ESOPHAGOGASTRODUODENOSCOPY (EGD) WITH PROPOFOL N/A 09/23/2020   Procedure:  ESOPHAGOGASTRODUODENOSCOPY (EGD) WITH PROPOFOL;  Surgeon: Rourk, Robert M, MD;  Location: [facility];  Service: Endoscopy;  Laterality: N/A;  7:30am   EYE SURGERY     HEMORRHOID SURGERY     MALONEY DILATION N/A 09/23/2020   Procedure: MALONEY DILATION;  Surgeon: Rourk, Robert M, MD;  Location: [facility];  Service: Endoscopy;  Laterality: N/A;   POLYPECTOMY  02/25/2022   Procedure: POLYPECTOMY;  Surgeon: Rourk, Robert M, MD;  Location: [facility];  Service: Endoscopy;;   THROAT SURGERY     Biopsy    Family History  Problem Relation Age of Onset   Cancer Mother    Heart failure Father     Current Outpatient Medications on File Prior to Visit  Medication Sig Dispense Refill   acetaminophen (TYLENOL) 500 MG tablet Take 1,000 mg by mouth daily as needed for moderate pain.     Biotin 10 MG TABS Take 1 tablet by mouth daily.     Cyanocobalamin (B-12) 2500 MCG TABS Take 2,500 mcg by mouth every other day.     FLUoxetine (PROZAC) 20 MG capsule Take 20 mg by mouth daily.     hydrochlorothiazide (HYDRODIURIL) 25 MG tablet Take 25 mg by mouth daily.     HYDROcodone-acetaminophen (NORCO/VICODIN) 5-325 MG tablet Take 0.5-1 tablets by mouth 2 (two) times daily as needed for severe pain.     hydrocortisone (ANUSOL-HC) 2.5 % rectal cream Place 1 application. rectally 2 (two) times daily. (Patient taking differently: Place 1 application  rectally 2 (two) times daily as   needed for hemorrhoids.) 30 g 1   levothyroxine (SYNTHROID) 88 MCG tablet Take 88 mcg by mouth daily.     meclizine (DRAMAMINE) 25 MG tablet Take 25 mg by mouth 3 (three) times daily as needed for nausea.     metFORMIN (GLUCOPHAGE) 500 MG tablet Take 500 mg by mouth daily.     metoprolol tartrate (LOPRESSOR) 25 MG tablet Take 25 mg by mouth daily.     pantoprazole (PROTONIX) 40 MG tablet Take 1 tablet (40 mg total) by mouth daily. 90 tablet 1   penicillin v potassium (VEETID) 500 MG tablet Take 500 mg by mouth daily.      predniSONE (DELTASONE) 5 MG tablet Take 5 mg by mouth daily as needed (back pain).     vitamin E 180 MG (400 UNITS) capsule Take 400 Units by mouth every other day.     No current facility-administered medications on file prior to visit.    Allergies  Allergen Reactions   Cyclobenzaprine     sick   Tramadol Other (See Comments)    sick    Social History   Substance and Sexual Activity  Alcohol Use No    Social History   Tobacco Use  Smoking Status Former   Types: Cigarettes   Passive exposure: Past  Smokeless Tobacco Never    Review of Systems  Constitutional:  Positive for malaise/fatigue.  HENT: Negative.    Eyes: Negative.   Respiratory:  Positive for cough.   Cardiovascular: Negative.   Gastrointestinal:  Positive for abdominal pain and nausea.  Genitourinary:  Positive for frequency.  Musculoskeletal:  Positive for back pain, joint pain and neck pain.  Skin: Negative.   Neurological: Negative.   Endo/Heme/Allergies: Negative.   Psychiatric/Behavioral: Negative.      Objective   Vitals:   06/11/22 1018  BP: 137/81  Pulse: 67  Resp: 18  Temp: 97.8 F (36.6 C)  SpO2: 95%    Physical Exam Vitals reviewed.  Constitutional:      Appearance: Normal appearance. She is not ill-appearing.  HENT:     Head: Normocephalic and atraumatic.  Cardiovascular:     Rate and Rhythm: Normal rate and regular rhythm.     Heart sounds: Normal heart sounds. No murmur heard.    No friction rub. No gallop.  Pulmonary:     Effort: Pulmonary effort is normal. No respiratory distress.     Breath sounds: Normal breath sounds. No stridor. No wheezing, rhonchi or rales.  Abdominal:     General: Bowel sounds are normal. There is no distension.     Palpations: Abdomen is soft. There is no mass.     Tenderness: There is abdominal tenderness. There is no guarding or rebound.     Hernia: A hernia is present.     Comments: Tender over umbilical hernia with the palpable  subcutaneous fat pad supraumbilical.  3 cm umbilical hernia appreciated.  Skin:    General: Skin is warm and dry.  Neurological:     Mental Status: She is alert and oriented to person, place, and time.    Previous records reviewed Assessment  Ventral hernia Plan  I told the patient that I could not assure her that fixing the hernias would resolve all her pain.  She understands this and would like to proceed with surgery.  Patient is scheduled for robotic assisted laparoscopic ventral herniorrhaphy with mesh on 07/31/2022.  The risks and benefits of the procedure including bleeding, infection, mesh use, bowel injury,   and the possibility of an open procedure were fully explained to the patient, who gave informed consent. 

## 2022-07-09 ENCOUNTER — Encounter (HOSPITAL_COMMUNITY)
Admission: RE | Admit: 2022-07-09 | Discharge: 2022-07-09 | Disposition: A | Discharge status: Home / Self Care | Payer: Medicare PPO | Source: Ambulatory Visit | Attending: General Surgery | Admitting: General Surgery

## 2022-07-09 VITALS — BP 143/67 | HR 97 | Temp 98.4°F | Resp 18 | Ht 61.0 in | Wt 153.0 lb

## 2022-07-09 DIAGNOSIS — I10 Essential (primary) hypertension: Secondary | ICD-10-CM

## 2022-07-09 DIAGNOSIS — Z01810 Encounter for preprocedural cardiovascular examination: Secondary | ICD-10-CM | POA: Insufficient documentation

## 2022-07-12 DIAGNOSIS — E1165 Type 2 diabetes mellitus with hyperglycemia: Secondary | ICD-10-CM | POA: Diagnosis not present

## 2022-07-14 ENCOUNTER — Ambulatory Visit (HOSPITAL_BASED_OUTPATIENT_CLINIC_OR_DEPARTMENT_OTHER): Payer: Medicare PPO | Admitting: Anesthesiology

## 2022-07-14 ENCOUNTER — Encounter (HOSPITAL_COMMUNITY)
Admission: RE | Disposition: A | Discharge status: Home / Self Care | Payer: Self-pay | Source: Home / Self Care | Attending: General Surgery

## 2022-07-14 ENCOUNTER — Ambulatory Visit (HOSPITAL_COMMUNITY): Payer: Medicare PPO | Admitting: Anesthesiology

## 2022-07-14 ENCOUNTER — Ambulatory Visit (HOSPITAL_COMMUNITY)
Admission: RE | Admit: 2022-07-14 | Discharge: 2022-07-14 | Disposition: A | Discharge status: Home / Self Care | Payer: Medicare PPO | Attending: General Surgery | Admitting: General Surgery

## 2022-07-14 DIAGNOSIS — K439 Ventral hernia without obstruction or gangrene: Secondary | ICD-10-CM | POA: Diagnosis not present

## 2022-07-14 DIAGNOSIS — E119 Type 2 diabetes mellitus without complications: Secondary | ICD-10-CM | POA: Diagnosis not present

## 2022-07-14 DIAGNOSIS — E039 Hypothyroidism, unspecified: Secondary | ICD-10-CM | POA: Insufficient documentation

## 2022-07-14 DIAGNOSIS — Z7984 Long term (current) use of oral hypoglycemic drugs: Secondary | ICD-10-CM | POA: Diagnosis not present

## 2022-07-14 DIAGNOSIS — K429 Umbilical hernia without obstruction or gangrene: Secondary | ICD-10-CM | POA: Insufficient documentation

## 2022-07-14 DIAGNOSIS — Z09 Encounter for follow-up examination after completed treatment for conditions other than malignant neoplasm: Secondary | ICD-10-CM | POA: Insufficient documentation

## 2022-07-14 DIAGNOSIS — Z87891 Personal history of nicotine dependence: Secondary | ICD-10-CM | POA: Insufficient documentation

## 2022-07-14 DIAGNOSIS — K432 Incisional hernia without obstruction or gangrene: Secondary | ICD-10-CM | POA: Diagnosis not present

## 2022-07-14 DIAGNOSIS — I10 Essential (primary) hypertension: Secondary | ICD-10-CM | POA: Insufficient documentation

## 2022-07-14 HISTORY — PX: XI ROBOTIC ASSISTED VENTRAL HERNIA: SHX6789

## 2022-07-14 LAB — GLUCOSE, CAPILLARY
Glucose-Capillary: 101 mg/dL — ABNORMAL HIGH (ref 70–99)
Glucose-Capillary: 149 mg/dL — ABNORMAL HIGH (ref 70–99)

## 2022-07-14 SURGERY — REPAIR, HERNIA, VENTRAL, ROBOT-ASSISTED
Anesthesia: General | Site: Abdomen

## 2022-07-14 MED ORDER — DEXAMETHASONE SODIUM PHOSPHATE 10 MG/ML IJ SOLN
INTRAMUSCULAR | Status: DC | PRN
  Administered 2022-07-14: 8 mg via INTRAVENOUS

## 2022-07-14 MED ORDER — LACTATED RINGERS IV SOLN: INTRAVENOUS | Status: DC

## 2022-07-14 MED ORDER — ONDANSETRON HCL 4 MG/2ML IJ SOLN
4.0000 mg | Freq: Once | INTRAMUSCULAR | Status: AC | PRN
  Administered 2022-07-14: 4 mg via INTRAVENOUS
  Filled 2022-07-14: qty 2

## 2022-07-14 MED ORDER — CHLORHEXIDINE GLUCONATE 0.12 % MT SOLN: 15.0000 mL | Freq: Once | OROMUCOSAL | Status: DC

## 2022-07-14 MED ORDER — BUPIVACAINE HCL (PF) 0.5 % IJ SOLN
INTRAMUSCULAR | Status: AC
  Filled 2022-07-14: qty 30

## 2022-07-14 MED ORDER — CHLORHEXIDINE GLUCONATE CLOTH 2 % EX PADS: 6.0000 | MEDICATED_PAD | Freq: Once | CUTANEOUS | Status: DC

## 2022-07-14 MED ORDER — KETOROLAC TROMETHAMINE 30 MG/ML IJ SOLN
30.0000 mg | Freq: Once | INTRAMUSCULAR | Status: AC
  Administered 2022-07-14: 30 mg via INTRAVENOUS
  Filled 2022-07-14: qty 1

## 2022-07-14 MED ORDER — OXYCODONE HCL 5 MG PO TABS
5.0000 mg | ORAL_TABLET | Freq: Once | ORAL | Status: AC | PRN
  Administered 2022-07-14: 5 mg via ORAL
  Filled 2022-07-14: qty 1

## 2022-07-14 MED ORDER — ROCURONIUM 10MG/ML (10ML) SYRINGE FOR MEDFUSION PUMP - OPTIME
INTRAVENOUS | Status: DC | PRN
  Administered 2022-07-14: 50 mg via INTRAVENOUS
  Administered 2022-07-14: 10 mg via INTRAVENOUS

## 2022-07-14 MED ORDER — CEFAZOLIN SODIUM-DEXTROSE 2-4 GM/100ML-% IV SOLN
2.0000 g | INTRAVENOUS | Status: AC
  Administered 2022-07-14: 2 g via INTRAVENOUS

## 2022-07-14 MED ORDER — OXYCODONE HCL 5 MG/5ML PO SOLN: 5.0000 mg | Freq: Once | ORAL | Status: AC | PRN

## 2022-07-14 MED ORDER — ONDANSETRON HCL 4 MG/2ML IJ SOLN
INTRAMUSCULAR | Status: DC | PRN
  Administered 2022-07-14: 4 mg via INTRAVENOUS

## 2022-07-14 MED ORDER — ROCURONIUM BROMIDE 10 MG/ML (PF) SYRINGE
PREFILLED_SYRINGE | INTRAVENOUS | Status: AC
  Filled 2022-07-14: qty 10

## 2022-07-14 MED ORDER — HYDROCODONE-ACETAMINOPHEN 5-325 MG PO TABS: 1.0000 | ORAL_TABLET | Freq: Four times a day (QID) | ORAL | 0 refills | Status: DC | PRN

## 2022-07-14 MED ORDER — PROPOFOL 10 MG/ML IV BOLUS
INTRAVENOUS | Status: AC
  Filled 2022-07-14: qty 20

## 2022-07-14 MED ORDER — DEXAMETHASONE SODIUM PHOSPHATE 10 MG/ML IJ SOLN
INTRAMUSCULAR | Status: AC
  Filled 2022-07-14: qty 1

## 2022-07-14 MED ORDER — FENTANYL CITRATE PF 50 MCG/ML IJ SOSY
25.0000 ug | PREFILLED_SYRINGE | INTRAMUSCULAR | Status: DC | PRN
  Administered 2022-07-14: 50 ug via INTRAVENOUS
  Filled 2022-07-14: qty 1

## 2022-07-14 MED ORDER — LIDOCAINE HCL (PF) 2 % IJ SOLN
INTRAMUSCULAR | Status: AC
  Filled 2022-07-14: qty 5

## 2022-07-14 MED ORDER — FENTANYL CITRATE (PF) 100 MCG/2ML IJ SOLN
INTRAMUSCULAR | Status: AC
  Filled 2022-07-14: qty 2

## 2022-07-14 MED ORDER — LIDOCAINE HCL (CARDIAC) PF 50 MG/5ML IV SOSY
PREFILLED_SYRINGE | INTRAVENOUS | Status: DC | PRN
  Administered 2022-07-14: 80 mg via INTRAVENOUS

## 2022-07-14 MED ORDER — ONDANSETRON HCL 4 MG/2ML IJ SOLN
INTRAMUSCULAR | Status: AC
  Filled 2022-07-14: qty 2

## 2022-07-14 MED ORDER — ORAL CARE MOUTH RINSE: 15.0000 mL | Freq: Once | OROMUCOSAL | Status: DC

## 2022-07-14 MED ORDER — PROPOFOL 10 MG/ML IV BOLUS
INTRAVENOUS | Status: DC | PRN
  Administered 2022-07-14: 130 mg via INTRAVENOUS

## 2022-07-14 MED ORDER — LACTATED RINGERS IV SOLN: INTRAVENOUS | Status: DC | PRN

## 2022-07-14 MED ORDER — BUPIVACAINE HCL (PF) 0.5 % IJ SOLN
INTRAMUSCULAR | Status: DC | PRN
  Administered 2022-07-14: 30 mL

## 2022-07-14 MED ORDER — SUGAMMADEX SODIUM 200 MG/2ML IV SOLN
INTRAVENOUS | Status: DC | PRN
  Administered 2022-07-14: 200 mg via INTRAVENOUS

## 2022-07-14 MED ORDER — FENTANYL CITRATE (PF) 100 MCG/2ML IJ SOLN
INTRAMUSCULAR | Status: DC | PRN
  Administered 2022-07-14: 50 ug via INTRAVENOUS
  Administered 2022-07-14 (×2): 25 ug via INTRAVENOUS

## 2022-07-14 MED ORDER — 0.9 % SODIUM CHLORIDE (POUR BTL) OPTIME
TOPICAL | Status: DC | PRN
  Administered 2022-07-14: 1000 mL

## 2022-07-14 MED ORDER — CEFAZOLIN SODIUM-DEXTROSE 2-4 GM/100ML-% IV SOLN
INTRAVENOUS | Status: AC
  Filled 2022-07-14: qty 100

## 2022-07-14 SURGICAL SUPPLY — 48 items
ADH SKN CLS APL DERMABOND .7 (GAUZE/BANDAGES/DRESSINGS) ×1
ADH SKN CLS LQ APL DERMABOND (GAUZE/BANDAGES/DRESSINGS) ×1
APL PRP STRL LF DISP 70% ISPRP (MISCELLANEOUS) ×1
CHLORAPREP W/TINT 26 (MISCELLANEOUS) ×1 IMPLANT
COVER LIGHT HANDLE STERIS (MISCELLANEOUS) ×2 IMPLANT
COVER MAYO STAND XLG (MISCELLANEOUS) ×1 IMPLANT
COVER TIP SHEARS 8 DVNC (MISCELLANEOUS) ×1 IMPLANT
DERMABOND ADVANCED .7 DNX12 (GAUZE/BANDAGES/DRESSINGS) ×1 IMPLANT
DERMABOND ADVANCED .7 DNX6 (GAUZE/BANDAGES/DRESSINGS) IMPLANT
DRAPE ARM DVNC X/XI (DISPOSABLE) ×3 IMPLANT
DRAPE COLUMN DVNC XI (DISPOSABLE) ×1 IMPLANT
DRIVER NDL MEGA SUTCUT DVNCXI (INSTRUMENTS) ×1 IMPLANT
DRIVER NDLE MEGA SUTCUT DVNCXI (INSTRUMENTS) ×1 IMPLANT
ELECT REM PT RETURN 9FT ADLT (ELECTROSURGICAL) ×1
ELECTRODE REM PT RTRN 9FT ADLT (ELECTROSURGICAL) ×1 IMPLANT
FORCEPS BPLR R/ABLATION 8 DVNC (INSTRUMENTS) ×1 IMPLANT
GLOVE BIOGEL PI IND STRL 6.5 (GLOVE) IMPLANT
GLOVE BIOGEL PI IND STRL 7.0 (GLOVE) ×4 IMPLANT
GLOVE SURG SS PI 7.5 STRL IVOR (GLOVE) ×2 IMPLANT
GOWN STRL REUS W/ TWL LRG LVL3 (GOWN DISPOSABLE) IMPLANT
GOWN STRL REUS W/TWL LRG LVL3 (GOWN DISPOSABLE) ×4 IMPLANT
GRASPER SUT TROCAR 14GX15 (MISCELLANEOUS) IMPLANT
KIT TURNOVER KIT A (KITS) ×1 IMPLANT
MESH VENTRALIGHT ST 4X6IN (Mesh General) IMPLANT
NDL HYPO 21X1.5 SAFETY (NEEDLE) ×1 IMPLANT
NDL INSUFFLATION 14GA 120MM (NEEDLE) ×1 IMPLANT
NEEDLE HYPO 21X1.5 SAFETY (NEEDLE) ×1 IMPLANT
NEEDLE INSUFFLATION 14GA 120MM (NEEDLE) ×1 IMPLANT
OBTURATOR OPTICAL STND 8 DVNC (TROCAR) ×1
OBTURATOR OPTICALSTD 8 DVNC (TROCAR) ×1 IMPLANT
PACK LAP CHOLE LZT030E (CUSTOM PROCEDURE TRAY) ×1 IMPLANT
PAD ARMBOARD 7.5X6 YLW CONV (MISCELLANEOUS) ×1 IMPLANT
PENCIL HANDSWITCHING (ELECTRODE) ×1 IMPLANT
POSITIONER HEAD 8X9X4 ADT (SOFTGOODS) ×1 IMPLANT
SCISSORS MNPLR CVD DVNC XI (INSTRUMENTS) ×1 IMPLANT
SEAL CANN UNIV 5-8 DVNC XI (MISCELLANEOUS) ×3 IMPLANT
SET BASIN LINEN APH (SET/KITS/TRAYS/PACK) ×1 IMPLANT
SET TUBE SMOKE EVAC HIGH FLOW (TUBING) ×1 IMPLANT
SUT MNCRL AB 4-0 PS2 18 (SUTURE) ×2 IMPLANT
SUT STRATAFIX 0 PDS+ CT-2 23 (SUTURE) ×1
SUT STRATAFIX PDS 30 CT-1 (SUTURE) ×1 IMPLANT
SUT V-LOC 90 ABS 3-0 VLT V-20 (SUTURE) ×1 IMPLANT
SUTURE STRATFX 0 PDS+ CT-2 23 (SUTURE) IMPLANT
SYR 30ML LL (SYRINGE) ×1 IMPLANT
TAPE TRANSPORE STRL 2 31045 (GAUZE/BANDAGES/DRESSINGS) ×2 IMPLANT
TRAY FOLEY W/BAG SLVR 16FR (SET/KITS/TRAYS/PACK) ×1
TRAY FOLEY W/BAG SLVR 16FR ST (SET/KITS/TRAYS/PACK) ×1 IMPLANT
WATER STERILE IRR 500ML POUR (IV SOLUTION) ×1 IMPLANT

## 2022-07-14 NOTE — Anesthesia Preprocedure Evaluation (Signed)
Anesthesia Evaluation  Patient identified by MRN, date of birth, ID band Patient awake    Reviewed: Allergy & Precautions, H&P , NPO status , Patient's Chart, lab work & pertinent test results, reviewed documented beta blocker date and time   Airway Mallampati: II  TM Distance: >3 FB Neck ROM: full    Dental no notable dental hx.    Pulmonary neg pulmonary ROS, former smoker   Pulmonary exam normal breath sounds clear to auscultation       Cardiovascular Exercise Tolerance: Good hypertension, negative cardio ROS  Rhythm:regular Rate:Normal     Neuro/Psych  PSYCHIATRIC DISORDERS Anxiety Depression     Neuromuscular disease negative neurological ROS  negative psych ROS   GI/Hepatic negative GI ROS, Neg liver ROS,GERD  ,,  Endo/Other  negative endocrine ROSdiabetesHypothyroidism    Renal/GU negative Renal ROS  negative genitourinary   Musculoskeletal   Abdominal   Peds  Hematology negative hematology ROS (+)   Anesthesia Other Findings   Reproductive/Obstetrics negative OB ROS                             Anesthesia Physical Anesthesia Plan  ASA: 2  Anesthesia Plan: General and General ETT   Post-op Pain Management:    Induction:   PONV Risk Score and Plan: Ondansetron  Airway Management Planned:   Additional Equipment:   Intra-op Plan:   Post-operative Plan:   Informed Consent: I have reviewed the patients History and Physical, chart, labs and discussed the procedure including the risks, benefits and alternatives for the proposed anesthesia with the patient or authorized representative who has indicated his/her understanding and acceptance.     Dental Advisory Given  Plan Discussed with: CRNA  Anesthesia Plan Comments:        Anesthesia Quick Evaluation

## 2022-07-14 NOTE — Interval H&P Note (Signed)
History and Physical Interval Note:  07/14/2022 6:59 AM  Connie Kent  has presented today for surgery, with the diagnosis of Ventral/Umbilical Hernia.  The various methods of treatment have been discussed with the patient and family. After consideration of risks, benefits and other options for treatment, the patient has consented to  Procedure(s) with comments: XI ROBOTIC ASSISTED VENTRAL HERNIA W/ MESH (N/A) - per office pt will arrive at 6:30 as a surgical intervention.  The patient's history has been reviewed, patient examined, no change in status, stable for surgery.  I have reviewed the patient's chart and labs.  Questions were answered to the patient's satisfaction.     Franky Macho

## 2022-07-14 NOTE — Op Note (Signed)
Patient:  Connie Kent  DOB:  October 24, 1945  MRN:  [id number]   Preop Diagnosis: Ventral hernia x 2  Postop Diagnosis: Same  Procedure: Robotic assisted laparoscopic ventral herniorrhaphy with mesh  Surgeon: Franky Macho, MD  Anes: General endotracheal  Indications: Patient is a 77 year old white female who presents with 2 ventral hernias.  The risks and benefits of the procedure including bleeding, infection, mesh use, the possibility of recurrence of the hernia were fully explained to the patient, who gave informed consent.  Procedure note: The patient was placed in the supine position.  After induction of general endotracheal anesthesia, the abdomen was prepped and draped using the usual sterile technique with ChloraPrep.  Surgical site confirmation was performed.  An incision was made at Palmer's point.  A Veress needle was introduced into the abdominal cavity and confirmation of placement was done using the saline drop test.  The abdomen was then insufflated to 15 mmHg pressure.  An 8 mm trocar was introduced into the abdominal cavity under direct visualization without difficulty.  Additional 8 mm trocars were placed in the left mid flank and left lower quadrant regions.  The robot was then targeted and docked.  On inspection of the anterior abdominal wall.  The patient had a 3 cm umbilical hernia as well as a 3 cm supraumbilical hernia that were approximately 3 cm apart.  Both hernias were reduced of adipose tissue.  They were closed as 1 using an 0 Stratafix running suture.  A 10 x 15 cm Bard Ventralight dual mesh was then placed and secured circumferentially using a 3-0 V-Loc running suture.  The robot was undocked and all air was evacuated from the abdominal cavity prior to removal of the trocars.  All wounds were irrigated with normal saline.  All wounds were injected with point 5% Sensorcaine.  All incisions were closed using 4-0 Monocryl subcuticular suture.  Dermabond was  applied.  All tape and needle counts were correct at the end of the procedure.  The patient was extubated in the operating room and transferred to PACU in stable condition.  Complications: None  EBL: Minimal  Specimen: None

## 2022-07-14 NOTE — Transfer of Care (Signed)
Immediate Anesthesia Transfer of Care Note  Patient: Connie Kent  Procedure(s) Performed: XI ROBOTIC ASSISTED VENTRAL HERNIA W/ MESH (Abdomen)  Patient Location: PACU  Anesthesia Type:General  Level of Consciousness: awake  Airway & Oxygen Therapy: Patient Spontanous Breathing  Post-op Assessment: Report given to RN  Post vital signs: Reviewed and stable  Last Vitals:  Vitals Value Taken Time  BP 160/67 07/14/22 0930  Temp    Pulse 85 07/14/22 0932  Resp 14 07/14/22 0932  SpO2 95 % 07/14/22 0932  Vitals shown include unvalidated device data.  Last Pain:  Vitals:   07/14/22 0647  TempSrc: Oral  PainSc: 0-No pain      Patients Stated Pain Goal: 6 (07/14/22 0454)  Complications: No notable events documented.

## 2022-07-14 NOTE — Anesthesia Procedure Notes (Signed)
Procedure Name: Intubation Date/Time: 07/14/2022 7:31 AM  Performed by: Moshe Salisbury, CRNAPre-anesthesia Checklist: Patient identified, Patient being monitored, Timeout performed, Emergency Drugs available and Suction available Patient Re-evaluated:Patient Re-evaluated prior to induction Oxygen Delivery Method: Circle system utilized Preoxygenation: Pre-oxygenation with 100% oxygen Induction Type: IV induction Ventilation: Mask ventilation without difficulty Laryngoscope Size: Mac and 3 Grade View: Grade I Tube type: Oral Tube size: 7.0 mm Number of attempts: 1 Airway Equipment and Method: Stylet Placement Confirmation: ETT inserted through vocal cords under direct vision, positive ETCO2 and breath sounds checked- equal and bilateral Secured at: 21 cm Tube secured with: Tape Dental Injury: Teeth and Oropharynx as per pre-operative assessment

## 2022-07-16 NOTE — Anesthesia Postprocedure Evaluation (Signed)
Anesthesia Post Note  Patient: Connie Kent  Procedure(s) Performed: XI ROBOTIC ASSISTED VENTRAL HERNIA W/ MESH (Abdomen)  Patient location during evaluation: Phase II Anesthesia Type: General Level of consciousness: awake Pain management: pain level controlled Vital Signs Assessment: post-procedure vital signs reviewed and stable Respiratory status: spontaneous breathing and respiratory function stable Cardiovascular status: blood pressure returned to baseline and stable Postop Assessment: no headache and no apparent nausea or vomiting Anesthetic complications: no Comments: Late entry   No notable events documented.   Last Vitals:  Vitals:   07/14/22 1045 07/14/22 1115  BP: (!) 156/65 130/78  Pulse: 86 84  Resp: 13 18  Temp:  36.7 C  SpO2: 95% 96%    Last Pain:  Vitals:   07/14/22 1115  TempSrc: Oral  PainSc: 4                  Windell Norfolk

## 2022-07-17 ENCOUNTER — Encounter (HOSPITAL_COMMUNITY): Payer: Self-pay | Admitting: General Surgery

## 2022-07-23 ENCOUNTER — Ambulatory Visit (INDEPENDENT_AMBULATORY_CARE_PROVIDER_SITE_OTHER): Payer: Medicare PPO | Admitting: General Surgery

## 2022-07-23 DIAGNOSIS — Z09 Encounter for follow-up examination after completed treatment for conditions other than malignant neoplasm: Secondary | ICD-10-CM

## 2022-07-23 DIAGNOSIS — K439 Ventral hernia without obstruction or gangrene: Secondary | ICD-10-CM | POA: Diagnosis not present

## 2022-07-24 ENCOUNTER — Encounter: Payer: Self-pay | Admitting: General Surgery

## 2022-07-24 NOTE — Progress Notes (Signed)
Subjective:     Connie Kent  Virtual telephone postoperative visit performed with patient.  I was in my office and the patient was at home.  She states that she had had some nausea after the surgery as well as moderate incisional pain.  This has been easing since the surgery.  Her appetite is starting to come back.  She denies any fever or chills.  She states her incisions are healing well. Objective:    There were no vitals taken for this visit.  General:  alert and cooperative       Assessment:    Doing well postoperatively.    Plan:   I told the patient that she should continue to recover from the surgery.  Her postoperative course is not unexpected given the extent of her surgery.  Should she develop worsening symptoms or has any questions, she was instructed to call my office.  She may resume normal activity as able.  Total telephone time was 3 minutes.

## 2022-07-29 ENCOUNTER — Other Ambulatory Visit (HOSPITAL_COMMUNITY): Payer: Medicare PPO

## 2022-07-29 ENCOUNTER — Other Ambulatory Visit (INDEPENDENT_AMBULATORY_CARE_PROVIDER_SITE_OTHER): Payer: Medicare PPO | Admitting: General Surgery

## 2022-07-29 DIAGNOSIS — Z09 Encounter for follow-up examination after completed treatment for conditions other than malignant neoplasm: Secondary | ICD-10-CM

## 2022-07-29 MED ORDER — HYDROCODONE-ACETAMINOPHEN 5-325 MG PO TABS: 1.0000 | ORAL_TABLET | Freq: Four times a day (QID) | ORAL | 0 refills | Status: AC | PRN

## 2022-07-29 NOTE — Progress Notes (Signed)
Refill for Norco.

## 2022-08-07 DIAGNOSIS — I700 Atherosclerosis of aorta: Secondary | ICD-10-CM | POA: Diagnosis not present

## 2022-08-07 DIAGNOSIS — Z299 Encounter for prophylactic measures, unspecified: Secondary | ICD-10-CM | POA: Diagnosis not present

## 2022-08-07 DIAGNOSIS — E1165 Type 2 diabetes mellitus with hyperglycemia: Secondary | ICD-10-CM | POA: Diagnosis not present

## 2022-08-07 DIAGNOSIS — I471 Supraventricular tachycardia, unspecified: Secondary | ICD-10-CM | POA: Diagnosis not present

## 2022-08-07 DIAGNOSIS — I10 Essential (primary) hypertension: Secondary | ICD-10-CM | POA: Diagnosis not present

## 2022-08-07 DIAGNOSIS — Z0000 Encounter for general adult medical examination without abnormal findings: Secondary | ICD-10-CM | POA: Diagnosis not present

## 2022-08-12 DIAGNOSIS — E1165 Type 2 diabetes mellitus with hyperglycemia: Secondary | ICD-10-CM | POA: Diagnosis not present

## 2022-09-09 ENCOUNTER — Other Ambulatory Visit: Payer: Self-pay | Admitting: Internal Medicine

## 2022-09-11 ENCOUNTER — Telehealth: Payer: Self-pay | Admitting: *Deleted

## 2022-09-11 NOTE — Telephone Encounter (Signed)
Surgical Date: 07/14/2022 Procedure: XI ROBOTIC ASSISTED VENTRAL HERNIA W/ MESH   Received call from patient [phone]~ telephone.  Requested refill on Hydrocodone/ APAP 5/ 325 mg tablets. Advised that she is >1 month post op and should not require additional pain management at this time.   Inquired as to source of pain. Patient reports that she continues to have tenderness in LLQ of abdomen. States that the tenderness is under the incision in the area.   No redness or irritation to skin reported. No reports of warmth to touch, drainage, fever or chills.   Advised that patient would need to be seen by provider to evaluate. Advised that she can see PCP if needed.

## 2022-09-12 DIAGNOSIS — E1165 Type 2 diabetes mellitus with hyperglycemia: Secondary | ICD-10-CM | POA: Diagnosis not present

## 2022-10-05 ENCOUNTER — Other Ambulatory Visit (HOSPITAL_COMMUNITY): Payer: Self-pay | Admitting: Internal Medicine

## 2022-10-05 DIAGNOSIS — Z1231 Encounter for screening mammogram for malignant neoplasm of breast: Secondary | ICD-10-CM

## 2022-10-12 DIAGNOSIS — E1165 Type 2 diabetes mellitus with hyperglycemia: Secondary | ICD-10-CM | POA: Diagnosis not present

## 2022-11-09 DIAGNOSIS — E1165 Type 2 diabetes mellitus with hyperglycemia: Secondary | ICD-10-CM | POA: Diagnosis not present

## 2022-11-09 DIAGNOSIS — I10 Essential (primary) hypertension: Secondary | ICD-10-CM | POA: Diagnosis not present

## 2022-11-10 ENCOUNTER — Other Ambulatory Visit: Payer: Self-pay | Admitting: Gastroenterology

## 2022-11-11 DIAGNOSIS — E1165 Type 2 diabetes mellitus with hyperglycemia: Secondary | ICD-10-CM | POA: Diagnosis not present

## 2022-11-17 ENCOUNTER — Ambulatory Visit (HOSPITAL_COMMUNITY)
Admission: RE | Admit: 2022-11-17 | Discharge: 2022-11-17 | Disposition: A | Discharge status: Home / Self Care | Payer: Medicare PPO | Source: Ambulatory Visit | Attending: Urology | Admitting: Urology

## 2022-11-17 DIAGNOSIS — N289 Disorder of kidney and ureter, unspecified: Secondary | ICD-10-CM | POA: Diagnosis present

## 2022-11-17 DIAGNOSIS — N2889 Other specified disorders of kidney and ureter: Secondary | ICD-10-CM | POA: Insufficient documentation

## 2022-11-17 DIAGNOSIS — N1339 Other hydronephrosis: Secondary | ICD-10-CM | POA: Insufficient documentation

## 2022-11-17 DIAGNOSIS — N200 Calculus of kidney: Secondary | ICD-10-CM | POA: Diagnosis not present

## 2022-11-17 DIAGNOSIS — N1330 Unspecified hydronephrosis: Secondary | ICD-10-CM | POA: Diagnosis not present

## 2022-11-23 ENCOUNTER — Ambulatory Visit: Payer: Medicare PPO | Admitting: Urology

## 2022-11-23 VITALS — BP 111/66 | HR 79

## 2022-11-23 DIAGNOSIS — N289 Disorder of kidney and ureter, unspecified: Secondary | ICD-10-CM

## 2022-11-23 DIAGNOSIS — N3001 Acute cystitis with hematuria: Secondary | ICD-10-CM

## 2022-11-23 DIAGNOSIS — N3281 Overactive bladder: Secondary | ICD-10-CM

## 2022-11-23 LAB — URINALYSIS, ROUTINE W REFLEX MICROSCOPIC
Bilirubin, UA: NEGATIVE
Glucose, UA: NEGATIVE
Nitrite, UA: POSITIVE — AB
RBC, UA: NEGATIVE
Specific Gravity, UA: 1.025 (ref 1.005–1.030)
Urobilinogen, Ur: 0.2 mg/dL (ref 0.2–1.0)
pH, UA: 5.5 (ref 5.0–7.5)

## 2022-11-23 LAB — MICROSCOPIC EXAMINATION: WBC, UA: >30 /[HPF] — AB (ref 0–5)

## 2022-11-23 NOTE — Progress Notes (Unsigned)
11/23/2022 2:17 PM   Connie Kent 07-Feb-1945 [id number]  Referring provider: Kirstie Peri, MD [address] [place],  [place] [address]  Followup left renal mass   HPI: Connie Kent is a 77yo here for followup for a left renal mass. Mass is stable at 1.6cm on renal US. No flank pain. No worsening LUTS   PMH: Past Medical History:  Diagnosis Date   Diabetes mellitus without complication (HCC)    GERD (gastroesophageal reflux disease)    H. pylori infection    Confirmed eradication on EGD in September 2022   Hyperlipidemia    Hypertension    Thyroid disease     Surgical History: Past Surgical History:  Procedure Laterality Date   ABDOMINAL HYSTERECTOMY     BIOPSY  09/23/2020   Procedure: BIOPSY;  Surgeon: Corbin Ade, MD;  Location: [facility];  Service: Endoscopy;;   breast biopsies     BREAST BIOPSY Bilateral    [place]hin   CHOLECYSTECTOMY     COLONOSCOPY  06/02/1999   Dr. Jena Gauss; internal and external hemorrhoids, poor prep.   COLONOSCOPY  09/08/2002   Dr. Jena Gauss; friable internal hemorrhoids, scattered left-sided diverticula, otherwise normal colon and TI.   COLONOSCOPY WITH PROPOFOL N/A 02/25/2022   Procedure: COLONOSCOPY WITH PROPOFOL;  Surgeon: Corbin Ade, MD;  Location: [facility];  Service: Endoscopy;  Laterality: N/A;  7:30 am   ESOPHAGOGASTRODUODENOSCOPY  2003   Dr. Jena Gauss; normal esophagus s/p empiric dilation, superficial antral erosions.   ESOPHAGOGASTRODUODENOSCOPY  09/08/2002   Small hiatal hernia, otherwise normal.   ESOPHAGOGASTRODUODENOSCOPY  08/22/2007   Dr. Jena Gauss; normal esophagus s/p empiric dilation, small hiatal hernia, benign-appearing polypoid antral mucosa, otherwise normal.   ESOPHAGOGASTRODUODENOSCOPY (EGD) WITH PROPOFOL N/A 09/23/2020   Procedure: ESOPHAGOGASTRODUODENOSCOPY (EGD) WITH PROPOFOL;  Surgeon: Corbin Ade, MD;  [facility]: [facility];  Service: Endoscopy;  Laterality: N/A;  7:30am   EYE SURGERY     HEMORRHOID SURGERY      MALONEY DILATION N/A 09/23/2020   Procedure: Elease Hashimoto DILATION;  Surgeon: Corbin Ade, MD;  Location: [facility];  Service: Endoscopy;  Laterality: N/A;   POLYPECTOMY  02/25/2022   Procedure: POLYPECTOMY;  Surgeon: Corbin Ade, MD;  Location: [facility];  Service: Endoscopy;;   THROAT SURGERY     Biopsy   XI ROBOTIC ASSISTED VENTRAL HERNIA N/A 07/14/2022   Procedure: XI ROBOTIC ASSISTED VENTRAL HERNIA W/ MESH;  Surgeon: Franky Macho, MD;  Location: [facility];  Service: General;  Laterality: N/A;    Home Medications:  Allergies as of 11/23/2022       Reactions   Cyclobenzaprine    sick   Tramadol Other (See Comments)   sick        Medication List        Accurate as of November 23, 2022  2:17 PM. If you have any questions, ask your nurse or doctor.          acetaminophen 500 MG tablet Commonly known as: TYLENOL Take 1,000 mg by mouth every 6 (six) hours as needed for moderate pain.   B-12 2500 MCG Tabs Take 2,500 mcg by mouth every other day.   Biotin 10 MG Tabs Take 10 mg by mouth daily.   Dramamine 25 MG tablet Generic drug: meclizine Take 25 mg by mouth 3 (three) times daily as needed for nausea.   FLUoxetine 20 MG capsule Commonly known as: PROZAC Take 20 mg by mouth daily.   hydrochlorothiazide 25  MG tablet Commonly known as: HYDRODIURIL Take 25 mg by mouth daily.   HYDROcodone-acetaminophen 5-325 MG tablet Commonly known as: Norco Take 1 tablet by mouth every 6 (six) hours as needed for moderate pain.   levothyroxine 88 MCG tablet Commonly known as: SYNTHROID Take 88 mcg by mouth daily before breakfast.   metFORMIN 500 MG tablet Commonly known as: GLUCOPHAGE Take 500 mg by mouth daily.   metoprolol tartrate 25 MG tablet Commonly known as: LOPRESSOR Take 25 mg by mouth daily.   pantoprazole 40 MG tablet Commonly known as: PROTONIX TAKE 1 TABLET (40 MG TOTAL) BY MOUTH DAILY.   penicillin v potassium 500 MG tablet Commonly known  as: VEETID Take 500 mg by mouth daily.   predniSONE 5 MG tablet Commonly known as: DELTASONE Take 5 mg by mouth daily as needed (back pain).   vitamin E 180 MG (400 UNITS) capsule Take 400 Units by mouth every other day.        Allergies:  Allergies  Allergen Reactions   Cyclobenzaprine     sick   Tramadol Other (See Comments)    sick    Family History: Family History  Problem Relation Age of Onset   Cancer Mother    Heart failure Father     Social History:  reports that she has quit smoking. Her smoking use included cigarettes. She has been exposed to tobacco smoke. She has never used smokeless tobacco. She reports that she does not drink alcohol and does not use drugs.  ROS: All other review of systems were reviewed and are negative except what is noted above in HPI  Physical Exam: BP 111/66   Pulse 79   Constitutional:  Alert and oriented, No acute distress. HEENT: [place] AT, moist mucus membranes.  Trachea midline, no masses. Cardiovascular: No clubbing, cyanosis, or edema. Respiratory: Normal respiratory effort, no increased work of breathing. GI: Abdomen is soft, nontender, nondistended, no abdominal masses GU: No CVA tenderness.  Lymph: No cervical or inguinal lymphadenopathy. Skin: No rashes, bruises or suspicious lesions. Neurologic: Grossly intact, no focal deficits, moving all 4 extremities. Psychiatric: Normal mood and affect.  Laboratory Data: Lab Results  Component Value Date   WBC 4.3 04/29/2022   HGB 13.3 04/29/2022   HCT 40.8 04/29/2022   MCV 94 04/29/2022   PLT 234 04/29/2022    Lab Results  Component Value Date   CREATININE 0.86 04/29/2022    No results found for: "PSA"  No results found for: "TESTOSTERONE"  Lab Results  Component Value Date   HGBA1C 5.9 (H) 04/29/2022    Urinalysis    Component Value Date/Time   COLORURINE YELLOW 07/31/2020 1651   APPEARANCEUR Clear 04/29/2022 1607   LABSPEC 1.017 07/31/2020 1651   PHURINE  6.0 07/31/2020 1651   GLUCOSEU Negative 04/29/2022 1607   HGBUR NEGATIVE 07/31/2020 1651   BILIRUBINUR Negative 04/29/2022 1607   KETONESUR NEGATIVE 07/31/2020 1651   PROTEINUR Trace (A) 04/29/2022 1607   PROTEINUR NEGATIVE 07/31/2020 1651   UROBILINOGEN negative 01/31/2015 1149   NITRITE Positive (A) 04/29/2022 1607   NITRITE NEGATIVE 07/31/2020 1651   LEUKOCYTESUR 1+ (A) 04/29/2022 1607   LEUKOCYTESUR NEGATIVE 07/31/2020 1651    Lab Results  Component Value Date   LABMICR See below: 04/29/2022   WBCUA >30 (A) 04/29/2022   LABEPIT 0-10 04/29/2022   MUCUS Present (A) 11/26/2021   BACTERIA Many (A) 04/29/2022    Pertinent Imaging: Renal US 11/17/2022: Images reviewed and discussed with the patient  Results  for orders placed in visit on 04/29/22  DG Abd 1 View  Narrative CLINICAL DATA:  Flank pain, generalized abdominal pain and bloating  EXAM: ABDOMEN - 1 VIEW  COMPARISON:  None Available.  FINDINGS: Surgical clips RIGHT upper quadrant likely reflect prior cholecystectomy.  Nonobstructive bowel gas pattern with scattered stool throughout colon.  No bowel dilatation or bowel wall thickening.  No urinary tract calcification.  IMPRESSION: No acute abnormalities.   Electronically Signed By: Ulyses Southward M.D. On: 05/02/2022 12:04  No results found for this or any previous visit.  No results found for this or any previous visit.  No results found for this or any previous visit.  Results for orders placed during the hospital encounter of 11/18/21  US RENAL  Narrative CLINICAL DATA:  Renal lesion, follow-up  EXAM: RENAL / URINARY TRACT ULTRASOUND COMPLETE  COMPARISON:  CT abdomen and pelvis 09/18/2021, renal ultrasound 05/01/2021  FINDINGS: Right Kidney:  Renal measurements: 10.0 x 3.3 x 4.9 cm = volume: 85 mL. Cortical thinning. Normal cortical echogenicity. No hydronephrosis. Small exophytic cyst lateral RIGHT kidney 19 x 17 x 18 mm, containing  a few scattered internal echoes. Additional tiny cyst upper pole 8 x 8 x 9 mm, simple features. Small echogenic focus consistent with calculus 4 mm diameter at inferior pole, also noted on prior CT.  Left Kidney:  Renal measurements: 9.9 x 5.0 x 5.0 cm = volume: 129 mL. Normal cortical thickness and echogenicity. Mild collecting system dilatation. Heterogeneous solid-appearing mass at upper pole, exophytic, 16 x 15 x 16 mm, unchanged in size since recent CT. Lesion is highly suspicious for renal neoplasm. No additional masses or shadowing calcifications.  Bladder:  Appears normal for degree of bladder distention. Ureteral jets visualized. No postvoid bladder residual volume.  Other:  N/A  IMPRESSION: Mild LEFT renal collecting system dilatation.  Two small cysts within RIGHT kidney, the larger of which contains a few scattered internal echoes but is stable in size since 09/18/2021 and slightly smaller since the earlier ultrasound of 05/01/2021; no follow-up imaging recommended.  16 mm diameter heterogeneous solid mass at upper pole LEFT kidney, unchanged in size since recent CT, highly suspicious for renal neoplasm.  4 mm nonobstructing calculus at inferior pole RIGHT kidney.   Electronically Signed By: Ulyses Southward M.D. On: 11/19/2021 17:12  No valid procedures specified. No results found for this or any previous visit.  No results found for this or any previous visit.   Assessment & Plan:    1. Renal lesion -continue surveillance. Followup 1 year with a renal US - Urinalysis, Routine w reflex microscopic     No follow-ups on file.  Wilkie Aye, MD  [facility] [place]

## 2022-11-24 ENCOUNTER — Encounter: Payer: Self-pay | Admitting: Urology

## 2022-11-24 NOTE — Patient Instructions (Signed)

## 2022-11-27 ENCOUNTER — Telehealth: Payer: Self-pay

## 2022-11-27 LAB — URINE CULTURE

## 2022-11-27 MED ORDER — NITROFURANTOIN MONOHYD MACRO 100 MG PO CAPS: 100.0000 mg | ORAL_CAPSULE | Freq: Two times a day (BID) | ORAL | 0 refills | Status: DC

## 2022-11-27 NOTE — Telephone Encounter (Signed)
Pt called and made aware of positive urine culture and antibiotic sent to pharmacy.

## 2022-12-07 NOTE — Progress Notes (Deleted)
GI Office Note    Referring Provider: Kirstie Peri, MD Primary Care Physician:  Kirstie Peri, MD  Primary Gastroenterologist: Roetta Sessions, MD   Chief Complaint   No chief complaint on file.   History of Present Illness   Connie Kent is a 77 y.o. female presenting today for follow-up.  Last seen in May 2024.  History of chronic abdominal pain, vaguely worsened by eating, occurring for at least a couple of years.  No change with bowel function.  CTA demonstrated mesenteric vascular wide open, left renal lesion noted (concerning for left renal cell carcinoma)and now being followed by urology.  She also has a history of well-controlled GERD, with H. pylori infection treated and eradicated.  History of constipation MiraLAX.  History of nodular appearing liver on CT scan consistent with cirrhosis, likely NASH.  No evidence of portal hypertension.  Small ventral hernia just above the periumbilical hernia.  Abdominal pain suspected to be due to hernia, referred to general surgery.   GES normal 11/2021.   EGD September 2022: -Normal esophagus status post dilation -Normal stomach status post biopsy, mild chronic inflammation with reactive changes.  No H. pylori.   Colonoscopy 02/2022: -diverticulosis in sigmoid colon, noncompliant left colon -three 5-8 polyps in mid rectum, descending colon and cecum -two tubular adenomas -no future colonoscopy unless new symptoms  Medications   Current Outpatient Medications  Medication Sig Dispense Refill   acetaminophen (TYLENOL) 500 MG tablet Take 1,000 mg by mouth every 6 (six) hours as needed for moderate pain.     Biotin 10 MG TABS Take 10 mg by mouth daily.     Cyanocobalamin (B-12) 2500 MCG TABS Take 2,500 mcg by mouth every other day.     FLUoxetine (PROZAC) 20 MG capsule Take 20 mg by mouth daily.     hydrochlorothiazide (HYDRODIURIL) 25 MG tablet Take 25 mg by mouth daily.     HYDROcodone-acetaminophen (NORCO) 5-325 MG tablet  Take 1 tablet by mouth every 6 (six) hours as needed for moderate pain. 20 tablet 0   levothyroxine (SYNTHROID) 88 MCG tablet Take 88 mcg by mouth daily before breakfast.     meclizine (DRAMAMINE) 25 MG tablet Take 25 mg by mouth 3 (three) times daily as needed for nausea.     metFORMIN (GLUCOPHAGE) 500 MG tablet Take 500 mg by mouth daily.     metoprolol tartrate (LOPRESSOR) 25 MG tablet Take 25 mg by mouth daily.     nitrofurantoin, macrocrystal-monohydrate, (MACROBID) 100 MG capsule Take 1 capsule (100 mg total) by mouth 2 (two) times daily. 14 capsule 0   pantoprazole (PROTONIX) 40 MG tablet Take 1 tablet by mouth once daily 30 tablet 11   penicillin v potassium (VEETID) 500 MG tablet Take 500 mg by mouth daily.     predniSONE (DELTASONE) 5 MG tablet Take 5 mg by mouth daily as needed (back pain).     vitamin E 180 MG (400 UNITS) capsule Take 400 Units by mouth every other day.     No current facility-administered medications for this visit.    Allergies   Allergies as of 12/08/2022 - Review Complete 11/24/2022  Allergen Reaction Noted   Cyclobenzaprine  03/12/2016   Tramadol Other (See Comments) 03/12/2016     Past Medical History   Past Medical History:  Diagnosis Date   Diabetes mellitus without complication (HCC)    GERD (gastroesophageal reflux disease)    H. pylori infection    Confirmed eradication on EGD  in September 2022   Hyperlipidemia    Hypertension    Thyroid disease     Past Surgical History   Past Surgical History:  Procedure Laterality Date   ABDOMINAL HYSTERECTOMY     BIOPSY  09/23/2020   Procedure: BIOPSY;  Surgeon: Corbin Ade, MD;  Location: [facility];  Service: Endoscopy;;   breast biopsies     BREAST BIOPSY Bilateral    beghin   CHOLECYSTECTOMY     COLONOSCOPY  06/02/1999   Dr. Jena Gauss; internal and external hemorrhoids, poor prep.   COLONOSCOPY  09/08/2002   Dr. Jena Gauss; friable internal hemorrhoids, scattered left-sided diverticula,  otherwise normal colon and TI.   COLONOSCOPY WITH PROPOFOL N/A 02/25/2022   Procedure: COLONOSCOPY WITH PROPOFOL;  Surgeon: Corbin Ade, MD;  Location: [facility];  Service: Endoscopy;  Laterality: N/A;  7:30 am   ESOPHAGOGASTRODUODENOSCOPY  2003   Dr. Jena Gauss; normal esophagus s/p empiric dilation, superficial antral erosions.   ESOPHAGOGASTRODUODENOSCOPY  09/08/2002   Small hiatal hernia, otherwise normal.   ESOPHAGOGASTRODUODENOSCOPY  08/22/2007   Dr. Jena Gauss; normal esophagus s/p empiric dilation, small hiatal hernia, benign-appearing polypoid antral mucosa, otherwise normal.   ESOPHAGOGASTRODUODENOSCOPY (EGD) WITH PROPOFOL N/A 09/23/2020   Procedure: ESOPHAGOGASTRODUODENOSCOPY (EGD) WITH PROPOFOL;  Surgeon: Corbin Ade, MD;  [facility]: [facility];  Service: Endoscopy;  Laterality: N/A;  7:30am   EYE SURGERY     HEMORRHOID SURGERY     MALONEY DILATION N/A 09/23/2020   Procedure: Elease Hashimoto DILATION;  Surgeon: Corbin Ade, MD;  [facility]: [facility];  Service: Endoscopy;  Laterality: N/A;   POLYPECTOMY  02/25/2022   Procedure: POLYPECTOMY;  Surgeon: Corbin Ade, MD;  [facility]: [facility];  Service: Endoscopy;;   THROAT SURGERY     Biopsy   XI ROBOTIC ASSISTED VENTRAL HERNIA N/A 07/14/2022   Procedure: XI ROBOTIC ASSISTED VENTRAL HERNIA W/ MESH;  Surgeon: Franky Macho, MD;  Location: [facility];  Service: General;  Laterality: N/A;    Past Family History   Family History  Problem Relation Age of Onset   Cancer Mother    Heart failure Father     Past Social History   Social History   Socioeconomic History   Marital status: Married    Spouse name: Not on file   Number of children: 4   Years of education: Not on file   Highest education level: Not on file  Occupational History   Occupation: Retired  Tobacco Use   Smoking status: Former    Types: Cigarettes    Passive exposure: Past   Smokeless tobacco: Never  Vaping Use   Vaping status: Never Used   Substance and Sexual Activity   Alcohol use: No   Drug use: No   Sexual activity: Not Currently  Other Topics Concern   Not on file  Social History Narrative   Not on file   Social Determinants of Health   Financial Resource Strain: Not on file  Food Insecurity: Not on file  Transportation Needs: Not on file  Physical Activity: Not on file  Stress: Not on file  Social Connections: Not on file  Intimate Partner Violence: Not on file    Review of Systems   General: Negative for anorexia, weight loss, fever, chills, fatigue, weakness. ENT: Negative for hoarseness, difficulty swallowing , nasal congestion. CV: Negative for chest pain, angina, palpitations, dyspnea on exertion, peripheral edema.  Respiratory: Negative for dyspnea at rest, dyspnea on exertion, cough, sputum, wheezing.  GI: See history of present illness. GU:  Negative for dysuria, hematuria, urinary incontinence, urinary frequency, nocturnal urination.  Endo: Negative for unusual weight change.     Physical Exam   There were no vitals taken for this visit.   General: Well-nourished, well-developed in no acute distress.  Eyes: No icterus. Mouth: Oropharyngeal mucosa moist and pink , no lesions erythema or exudate. Lungs: Clear to auscultation bilaterally.  Heart: Regular rate and rhythm, no murmurs rubs or gallops.  Abdomen: Bowel sounds are normal, nontender, nondistended, no hepatosplenomegaly or masses,  no abdominal bruits or hernia , no rebound or guarding.  Rectal: ***  Extremities: No lower extremity edema. No clubbing or deformities. Neuro: Alert and oriented x 4   Skin: Warm and dry, no jaundice.   Psych: Alert and cooperative, normal mood and affect.  Labs   *** Imaging Studies   Ultrasound renal complete  Result Date: 12/07/2022 : PROCEDURE: US RENAL HISTORY: Patient is a 77 y/o  F with left renal mass. COMPARISON: U/S renal 11/18/2021, 05/01/2021, CTA abdomen/pelvis 09/18/2021. TECHNIQUE:  Two-dimensional grayscale and color Doppler ultrasound of the kidneys was performed. FINDINGS: The urinary bladder demonstrates normal anechoic echogenicity without wall thickening. The bilateral ureteral jets are not visualized. The right kidney measures 9.1 cm. Renal cortical echotexture is normal. There is no hydronephrosis. There is a 0.6 cm echogenic focus with a small amount of shadowing visualized within the lower pole sinus. There is a 1.7 cm anechoic cyst visualized arising from the lateral mid cortex. The left kidney measures 9.6 cm. Renal cortical echotexture is normal. There is a 1.9 x 1.6 x 1.6 cm ovoid, solid-appearing lesion with internal vascularity visualized arising from the upper pole cortex. There is mild hydronephrosis. There are no stones. There are no cysts. IMPRESSION: 1. Left renal 1.9 cm ovoid, exophytic solid-appearing lesion arising from the upper pole. This has increased in size when compared to the prior ultrasound dated 11/18/2021 when it measured 1.6 cm. 2.  Mild left-sided hydronephrosis. 3.  Nonobstructing left lower pole 0.6 cm calculus. Thank you for allowing [place] to assist in the care of this patient. Electronically Signed   By: Lestine Box M.D.   On: 12/07/2022 12:16    Assessment     Hepatoma screning  PLAN   ***   Leanna Battles. Melvyn Neth, MHS, PA-C [facility]

## 2022-12-08 ENCOUNTER — Ambulatory Visit: Payer: Medicare PPO | Admitting: Gastroenterology

## 2022-12-11 DIAGNOSIS — I10 Essential (primary) hypertension: Secondary | ICD-10-CM | POA: Diagnosis not present

## 2022-12-11 DIAGNOSIS — E1165 Type 2 diabetes mellitus with hyperglycemia: Secondary | ICD-10-CM | POA: Diagnosis not present

## 2022-12-12 DIAGNOSIS — E1165 Type 2 diabetes mellitus with hyperglycemia: Secondary | ICD-10-CM | POA: Diagnosis not present

## 2022-12-12 DIAGNOSIS — I10 Essential (primary) hypertension: Secondary | ICD-10-CM | POA: Diagnosis not present

## 2022-12-15 ENCOUNTER — Ambulatory Visit: Payer: Medicare PPO | Admitting: Gastroenterology

## 2022-12-22 ENCOUNTER — Ambulatory Visit: Payer: Medicare PPO | Admitting: Gastroenterology

## 2022-12-22 ENCOUNTER — Encounter: Payer: Self-pay | Admitting: Gastroenterology

## 2022-12-22 NOTE — Progress Notes (Unsigned)
GI Office Note    Referring Provider: Kirstie Peri, MD Primary Care Physician:  Kirstie Peri, MD  Primary Gastroenterologist: Roetta Sessions, MD   Chief Complaint   No chief complaint on file.   History of Present Illness   Connie Kent is a 77 y.o. female presenting today for follow up. Last seen in 05/2022. History of chronic abdominal pain, GERD, constipation, nodular appearing liver on CT c/w cirrhosis likely NASH, and small umbilical hernia containing fat and small ventral hernia just above noted on CT.  She continues to follow with urology for left renal mass.   Abdominal pain suspected to be due to hernias, seen by Dr. Lovell Sheehan and underwent ventral hernia repair X 2 in 07/2022.     CTA recently revealed wildly patent mesenteric vasculature.  Probable left renal cell carcinoma measuring 1.6 cm. Initially seen on 2022 study, 10mm at that time.  Changes of liver consistent with underlying cirrhosis,  no evidence of portal hypertension.No evidence of portal hypertension on EGD 1 year ago.  Dysphagia resolved  - status post dilation.   GES 11/2021: normal   Hep B vaccination advised.    EGD September 2022: -Normal esophagus status post dilation -Normal stomach status post biopsy, mild chronic inflammation with reactive changes.  No H. pylori.   Colonoscopy 02/2022: -diverticulosis in sigmoid colon, noncompliant left colon -three 5-8 polyps in mid rectum, descending colon and cecum -two tubular adenomas -no future colonoscopy unless new symptoms        Medications   Current Outpatient Medications  Medication Sig Dispense Refill   acetaminophen (TYLENOL) 500 MG tablet Take 1,000 mg by mouth every 6 (six) hours as needed for moderate pain.     Biotin 10 MG TABS Take 10 mg by mouth daily.     Cyanocobalamin (B-12) 2500 MCG TABS Take 2,500 mcg by mouth every other day.     FLUoxetine (PROZAC) 20 MG capsule Take 20 mg by mouth daily.     hydrochlorothiazide  (HYDRODIURIL) 25 MG tablet Take 25 mg by mouth daily.     HYDROcodone-acetaminophen (NORCO) 5-325 MG tablet Take 1 tablet by mouth every 6 (six) hours as needed for moderate pain. 20 tablet 0   levothyroxine (SYNTHROID) 88 MCG tablet Take 88 mcg by mouth daily before breakfast.     meclizine (DRAMAMINE) 25 MG tablet Take 25 mg by mouth 3 (three) times daily as needed for nausea.     metFORMIN (GLUCOPHAGE) 500 MG tablet Take 500 mg by mouth daily.     metoprolol tartrate (LOPRESSOR) 25 MG tablet Take 25 mg by mouth daily.     nitrofurantoin, macrocrystal-monohydrate, (MACROBID) 100 MG capsule Take 1 capsule (100 mg total) by mouth 2 (two) times daily. 14 capsule 0   pantoprazole (PROTONIX) 40 MG tablet Take 1 tablet by mouth once daily 30 tablet 11   penicillin v potassium (VEETID) 500 MG tablet Take 500 mg by mouth daily.     predniSONE (DELTASONE) 5 MG tablet Take 5 mg by mouth daily as needed (back pain).     vitamin E 180 MG (400 UNITS) capsule Take 400 Units by mouth every other day.     No current facility-administered medications for this visit.    Allergies   Allergies as of 12/22/2022 - Review Complete 11/24/2022  Allergen Reaction Noted   Cyclobenzaprine  03/12/2016   Tramadol Other (See Comments) 03/12/2016     Past Medical History   Past Medical History:  Diagnosis  Date   Diabetes mellitus without complication (HCC)    GERD (gastroesophageal reflux disease)    H. pylori infection    Confirmed eradication on EGD in September 2022   Hyperlipidemia    Hypertension    Thyroid disease     Past Surgical History   Past Surgical History:  Procedure Laterality Date   ABDOMINAL HYSTERECTOMY     BIOPSY  09/23/2020   Procedure: BIOPSY;  Surgeon: Corbin Ade, MD;  [facility]: [facility];  Service: Endoscopy;;   breast biopsies     BREAST BIOPSY Bilateral    beghin   CHOLECYSTECTOMY     COLONOSCOPY  06/02/1999   Dr. Jena Gauss; internal and external hemorrhoids, poor  prep.   COLONOSCOPY  09/08/2002   Dr. Jena Gauss; friable internal hemorrhoids, scattered left-sided diverticula, otherwise normal colon and TI.   COLONOSCOPY WITH PROPOFOL N/A 02/25/2022   Procedure: COLONOSCOPY WITH PROPOFOL;  Surgeon: Corbin Ade, MD;  [facility]: [facility];  Service: Endoscopy;  Laterality: N/A;  7:30 am   ESOPHAGOGASTRODUODENOSCOPY  2003   Dr. Jena Gauss; normal esophagus s/p empiric dilation, superficial antral erosions.   ESOPHAGOGASTRODUODENOSCOPY  09/08/2002   Small hiatal hernia, otherwise normal.   ESOPHAGOGASTRODUODENOSCOPY  08/22/2007   Dr. Jena Gauss; normal esophagus s/p empiric dilation, small hiatal hernia, benign-appearing polypoid antral mucosa, otherwise normal.   ESOPHAGOGASTRODUODENOSCOPY (EGD) WITH PROPOFOL N/A 09/23/2020   Procedure: ESOPHAGOGASTRODUODENOSCOPY (EGD) WITH PROPOFOL;  Surgeon: Corbin Ade, MD;  [facility];  Service: Endoscopy;  Laterality: N/A;  7:30am   EYE SURGERY     HEMORRHOID SURGERY     MALONEY DILATION N/A 09/23/2020   Procedure: Elease Hashimoto DILATION;  Surgeon: Corbin Ade, MD;  [facility]: [facility];  Service: Endoscopy;  Laterality: N/A;   POLYPECTOMY  02/25/2022   Procedure: POLYPECTOMY;  Surgeon: Corbin Ade, MD;  [facility];  Service: Endoscopy;;   THROAT SURGERY     Biopsy   XI ROBOTIC ASSISTED VENTRAL HERNIA N/A 07/14/2022   Procedure: XI ROBOTIC ASSISTED VENTRAL HERNIA W/ MESH;  Surgeon: Franky Macho, MD;  Location: [facility];  Service: General;  Laterality: N/A;    Past Family History   Family History  Problem Relation Age of Onset   Cancer Mother    Heart failure Father     Past Social History   Social History   Socioeconomic History   Marital status: Married    Spouse name: Not on file   Number of children: 4   Years of education: Not on file   Highest education level: Not on file  Occupational History   Occupation: Retired  Tobacco Use   Smoking status: Former    Types:  Cigarettes    Passive exposure: Past   Smokeless tobacco: Never  Vaping Use   Vaping status: Never Used  Substance and Sexual Activity   Alcohol use: No   Drug use: No   Sexual activity: Not Currently  Other Topics Concern   Not on file  Social History Narrative   Not on file   Social Determinants of Health   Financial Resource Strain: Not on file  Food Insecurity: Not on file  Transportation Needs: Not on file  Physical Activity: Not on file  Stress: Not on file  Social Connections: Not on file  Intimate Partner Violence: Not on file    Review of Systems   General: Negative for anorexia, weight loss, fever, chills, fatigue, weakness. ENT: Negative for hoarseness, difficulty swallowing ,  nasal congestion. CV: Negative for chest pain, angina, palpitations, dyspnea on exertion, peripheral edema.  Respiratory: Negative for dyspnea at rest, dyspnea on exertion, cough, sputum, wheezing.  GI: See history of present illness. GU:  Negative for dysuria, hematuria, urinary incontinence, urinary frequency, nocturnal urination.  Endo: Negative for unusual weight change.     Physical Exam   There were no vitals taken for this visit.   General: Well-nourished, well-developed in no acute distress.  Eyes: No icterus. Mouth: Oropharyngeal mucosa moist and pink , no lesions erythema or exudate. Lungs: Clear to auscultation bilaterally.  Heart: Regular rate and rhythm, no murmurs rubs or gallops.  Abdomen: Bowel sounds are normal, nontender, nondistended, no hepatosplenomegaly or masses,  no abdominal bruits or hernia , no rebound or guarding.  Rectal: ***  Extremities: No lower extremity edema. No clubbing or deformities. Neuro: Alert and oriented x 4   Skin: Warm and dry, no jaundice.   Psych: Alert and cooperative, normal mood and affect.  Labs   *** Imaging Studies   No results found.  Assessment       PLAN   ***   Leanna Battles. Melvyn Neth, MHS, PA-C [facility]

## 2023-01-11 DIAGNOSIS — E1165 Type 2 diabetes mellitus with hyperglycemia: Secondary | ICD-10-CM | POA: Diagnosis not present

## 2023-01-29 DIAGNOSIS — M549 Dorsalgia, unspecified: Secondary | ICD-10-CM | POA: Diagnosis not present

## 2023-01-29 DIAGNOSIS — Z299 Encounter for prophylactic measures, unspecified: Secondary | ICD-10-CM | POA: Diagnosis not present

## 2023-01-29 DIAGNOSIS — E1169 Type 2 diabetes mellitus with other specified complication: Secondary | ICD-10-CM | POA: Diagnosis not present

## 2023-01-29 DIAGNOSIS — M25559 Pain in unspecified hip: Secondary | ICD-10-CM | POA: Diagnosis not present

## 2023-01-29 DIAGNOSIS — I10 Essential (primary) hypertension: Secondary | ICD-10-CM | POA: Diagnosis not present

## 2023-01-29 DIAGNOSIS — R110 Nausea: Secondary | ICD-10-CM | POA: Diagnosis not present

## 2023-02-09 ENCOUNTER — Ambulatory Visit: Payer: Medicare HMO | Admitting: Internal Medicine

## 2023-02-10 DIAGNOSIS — E1165 Type 2 diabetes mellitus with hyperglycemia: Secondary | ICD-10-CM | POA: Diagnosis not present

## 2023-02-12 ENCOUNTER — Ambulatory Visit (INDEPENDENT_AMBULATORY_CARE_PROVIDER_SITE_OTHER): Payer: Medicare HMO | Admitting: Internal Medicine

## 2023-02-12 ENCOUNTER — Encounter: Payer: Self-pay | Admitting: Internal Medicine

## 2023-02-12 ENCOUNTER — Encounter: Payer: Self-pay | Admitting: *Deleted

## 2023-02-12 ENCOUNTER — Other Ambulatory Visit: Payer: Self-pay | Admitting: *Deleted

## 2023-02-12 ENCOUNTER — Other Ambulatory Visit: Payer: Self-pay

## 2023-02-12 ENCOUNTER — Telehealth: Payer: Self-pay | Admitting: *Deleted

## 2023-02-12 VITALS — BP 137/76 | HR 86 | Temp 98.2°F | Ht 61.0 in | Wt 147.4 lb

## 2023-02-12 DIAGNOSIS — K7460 Unspecified cirrhosis of liver: Secondary | ICD-10-CM

## 2023-02-12 DIAGNOSIS — K219 Gastro-esophageal reflux disease without esophagitis: Secondary | ICD-10-CM

## 2023-02-12 DIAGNOSIS — I10 Essential (primary) hypertension: Secondary | ICD-10-CM | POA: Diagnosis not present

## 2023-02-12 DIAGNOSIS — K5900 Constipation, unspecified: Secondary | ICD-10-CM

## 2023-02-12 DIAGNOSIS — E1165 Type 2 diabetes mellitus with hyperglycemia: Secondary | ICD-10-CM | POA: Diagnosis not present

## 2023-02-12 MED ORDER — ESOMEPRAZOLE MAGNESIUM 40 MG PO CPDR: 40.0000 mg | DELAYED_RELEASE_CAPSULE | Freq: Every day | ORAL | 11 refills | Status: DC

## 2023-02-12 NOTE — Progress Notes (Unsigned)
Primary Care Physician:  Kirstie Peri, MD Primary Gastroenterologist:  Dr. Jena Gauss  Pre-Procedure History & Physical: HPI:  Connie Kent is a 78 y.o. female here for constipation.  MiraLAX not working.  3 days since she had a bowel movement.  Has to strain all the time does not feel MiraLAX is working any longer.  No bleeding.  She is up-to-date on colonoscopy.  She has poorly controlled GERD on Protonix 40 mg once daily.  Recurrent esophageal dysphagia. Robotic assisted ventral hernia repair with mesh last year by Dr. Lovell Sheehan.  Has done well from that surgery. MASLD cirrhosis  -  remains well compensated.  Needs to have a screening EGD. Overdue for liver screening.  Immune to hepatitis A hepatitis B susceptible.  Chart needs to be researched as to whether or not she received hepatitis B vaccine.  Patient does not recall.  Past Medical History:  Diagnosis Date   Diabetes mellitus without complication (HCC)    GERD (gastroesophageal reflux disease)    H. pylori infection    Confirmed eradication on EGD in September 2022   Hyperlipidemia    Hypertension    Thyroid disease     Past Surgical History:  Procedure Laterality Date   ABDOMINAL HYSTERECTOMY     BIOPSY  09/23/2020   Procedure: BIOPSY;  Surgeon: Corbin Ade, MD;  Location: [facility];  Service: Endoscopy;;   breast biopsies     BREAST BIOPSY Bilateral    [place]   CHOLECYSTECTOMY     COLONOSCOPY  06/02/1999   Dr. Jena Gauss; internal and external hemorrhoids, poor prep.   COLONOSCOPY  09/08/2002   Dr. Jena Gauss; friable internal hemorrhoids, scattered left-sided diverticula, otherwise normal colon and TI.   COLONOSCOPY WITH PROPOFOL N/A 02/25/2022   Procedure: COLONOSCOPY WITH PROPOFOL;  Surgeon: Corbin Ade, MD;  Location: [facility];  Service: Endoscopy;  Laterality: N/A;  7:30 am   ESOPHAGOGASTRODUODENOSCOPY  2003   Dr. Jena Gauss; normal esophagus s/p empiric dilation, superficial antral erosions.    ESOPHAGOGASTRODUODENOSCOPY  09/08/2002   Small hiatal hernia, otherwise normal.   ESOPHAGOGASTRODUODENOSCOPY  08/22/2007   Dr. Jena Gauss; normal esophagus s/p empiric dilation, small hiatal hernia, benign-appearing polypoid antral mucosa, otherwise normal.   ESOPHAGOGASTRODUODENOSCOPY (EGD) WITH PROPOFOL N/A 09/23/2020   Procedure: ESOPHAGOGASTRODUODENOSCOPY (EGD) WITH PROPOFOL;  Surgeon: Corbin Ade, MD;  Location: [facility];  Service: Endoscopy;  Laterality: N/A;  7:30am   EYE SURGERY     HEMORRHOID SURGERY     MALONEY DILATION N/A 09/23/2020   Procedure: Elease Hashimoto DILATION;  Surgeon: Corbin Ade, MD;  Location: [facility];  Service: Endoscopy;  Laterality: N/A;   POLYPECTOMY  02/25/2022   Procedure: POLYPECTOMY;  Surgeon: Corbin Ade, MD;  Location: [facility];  Service: Endoscopy;;   THROAT SURGERY     Biopsy   XI ROBOTIC ASSISTED VENTRAL HERNIA N/A 07/14/2022   Procedure: XI ROBOTIC ASSISTED VENTRAL HERNIA W/ MESH;  Surgeon: Franky Macho, MD;  Location: [facility];  Service: General;  Laterality: N/A;    Prior to Admission medications   Medication Sig Start Date End Date Taking? Authorizing Provider  acetaminophen (TYLENOL) 500 MG tablet Take 1,000 mg by mouth every 6 (six) hours as needed for moderate pain.   Yes [provider]  Biotin 10 MG TABS Take 10 mg by mouth daily.   Yes [provider]  Cyanocobalamin (B-12) 2500 MCG TABS Take 2,500 mcg by mouth every other day.   Yes  [provider]  FLUoxetine (PROZAC) 20 MG capsule Take 20 mg by mouth daily. 05/04/20  Yes [provider]  hydrochlorothiazide (HYDRODIURIL) 25 MG tablet Take 25 mg by mouth daily. 03/26/21  Yes [provider]  HYDROcodone-acetaminophen (NORCO) 5-325 MG tablet Take 1 tablet by mouth every 6 (six) hours as needed for moderate pain. 07/29/22  Yes Franky Macho, MD  levothyroxine (SYNTHROID) 88 MCG tablet Take 88 mcg by mouth daily before breakfast. 03/17/22   Yes [provider]  meclizine (DRAMAMINE) 25 MG tablet Take 25 mg by mouth 3 (three) times daily as needed for nausea.   Yes [provider]  metFORMIN (GLUCOPHAGE) 500 MG tablet Take 500 mg by mouth daily. 12/30/21  Yes [provider]  nitrofurantoin, macrocrystal-monohydrate, (MACROBID) 100 MG capsule Take 1 capsule (100 mg total) by mouth 2 (two) times daily. 11/27/22  Yes McKenzie, Mardene Celeste, MD  pantoprazole (PROTONIX) 40 MG tablet Take 1 tablet by mouth once daily 11/25/22  Yes Nohelani Benning, Gerrit Friends, MD  penicillin v potassium (VEETID) 500 MG tablet Take 500 mg by mouth daily. 01/07/22  Yes [provider]  predniSONE (DELTASONE) 5 MG tablet Take 5 mg by mouth daily as needed (back pain).   Yes [provider]  vitamin E 180 MG (400 UNITS) capsule Take 400 Units by mouth every other day.   Yes [provider]  metoprolol tartrate (LOPRESSOR) 25 MG tablet Take 25 mg by mouth daily. Patient not taking: Reported on 02/12/2023 06/04/16   [provider]    Allergies as of 02/12/2023 - Review Complete 02/12/2023  Allergen Reaction Noted   Cyclobenzaprine  03/12/2016   Tramadol Other (See Comments) 03/12/2016    Family History  Problem Relation Age of Onset   Cancer Mother    Heart failure Father     Social History   Socioeconomic History   Marital status: Married    Spouse name: Not on file   Number of children: 4   Years of education: Not on file   Highest education level: Not on file  Occupational History   Occupation: Retired  Tobacco Use   Smoking status: Former    Types: Cigarettes    Passive exposure: Past   Smokeless tobacco: Never  Vaping Use   Vaping status: Never Used  Substance and Sexual Activity   Alcohol use: No   Drug use: No   Sexual activity: Not Currently  Other Topics Concern   Not on file  Social History Narrative   Not on file   Social Drivers of Health   Financial Resource Strain: Not  on file  Food Insecurity: Not on file  Transportation Needs: Not on file  Physical Activity: Not on file  Stress: Not on file  Social Connections: Not on file  Intimate Partner Violence: Not on file    Review of Systems: See HPI, otherwise negative ROS  Physical Exam: BP 137/76   Pulse 86   Temp 98.2 F (36.8 C)   Ht 5\' 1"  (1.549 m)   Wt 147 lb 6.4 oz (66.9 kg)   BMI 27.85 kg/m  General:   Alert,  Well-developed, well-nourished, pleasant and cooperative in NAD Neck:  Supple; no masses or thyromegaly. No significant cervical adenopathy. Lungs:  Clear throughout to auscultation.   No wheezes, crackles, or rhonchi. No acute distress. Heart:  Regular rate and rhythm; no murmurs, clicks, rubs,  or gallops. Abdomen: Nondistended.  Surgical scars well-healed positive bowel sounds soft and nontender  no appreciable mass no appreciable hernia.    Impression/Plan:    78 year old lady with well compensated mass old cirrhosis presents with a complaint of constipation in spite of MiraLAX.  Up-to-date on colonoscopy  Poorly controlled GERD with recurrent esophageal dysphagia on Protonix.  Not mentioned above, she stated she got some Nexium and took it and recently did much better as far as reflux control is concerned  Cirrhosis-well compensated.  Needs updated hepatoma screening and lab work.  Hepatitis B susceptible.  Chart needs to be researched as to whether or not she received hepatitis B vaccine.   As discussed, I have offered the patient an upper endoscopy to further evaluate your difficulty swallowing.  ASA 3 will schedule that in near future.  The risks, benefits, limitations, alternatives and imponderables have been reviewed with the patient. Potential for esophageal dilation, biopsy, etc. have also been reviewed.  Questions have been answered. All parties agreeable.   Stop Protonix  Begin Nexium 40 mg pill dispense 30 with 11 refills.  Take 1 of these pills 30 minutes before  breakfast daily.  Begin Linzess 145 gelcaps once daily for constipation.  2-week supply provided.  Please call  in 2 weeks to let me know how this medication is working . C-Met, CBC, INR, AFP now.   Determine hepatitis B vaccine status.  Avoid straining.  Constipation and GERD information provided  Further recommendations to follow.      Notice: This dictation was prepared with Dragon dictation along with smaller phrase technology. Any transcriptional errors that result from this process are unintentional and may not be corrected upon review.

## 2023-02-12 NOTE — Telephone Encounter (Signed)
Pt informed of [place] appt date, time, location and instructions.  EGD scheduled for 03/24/23. Instructions mailed

## 2023-02-12 NOTE — Telephone Encounter (Signed)
Attempted to call pt, unable to leave message due to vm  not activated.   [place] scheduled for 03/18/23, arrive at 8:15 am to check in, NPO after midnight  EGD w/Dr.Rourk, asa 3

## 2023-02-12 NOTE — Telephone Encounter (Signed)
[employer] PA:  Approved Authorization #[id number]  Tracking #[id number] Dates of service 03/24/2023 - 06/23/2023

## 2023-02-12 NOTE — Patient Instructions (Addendum)
It was good to see you again today!  As discussed you need to have an upper endoscopy to further evaluate your difficulty swallowing.  ASA 3 will schedule that in near future.  Stop Protonix  Begin Nexium 40 mg pill dispense 30 with 11 refills.  Take 1 of these pills 30 minutes before breakfast daily.  Begin Linzess 145 gelcaps once daily for constipation.  2-week supply provided.  Please call me in 2 weeks to let me know how this medication is working for you.  C-Met, CBC, INR, AFP now.    Avoid straining.  Constipation and GERD information provided  Further recommendations to follow.

## 2023-02-15 ENCOUNTER — Other Ambulatory Visit: Payer: Self-pay

## 2023-02-15 DIAGNOSIS — K5900 Constipation, unspecified: Secondary | ICD-10-CM

## 2023-02-15 DIAGNOSIS — K219 Gastro-esophageal reflux disease without esophagitis: Secondary | ICD-10-CM

## 2023-02-15 DIAGNOSIS — K7460 Unspecified cirrhosis of liver: Secondary | ICD-10-CM

## 2023-02-16 DIAGNOSIS — K7460 Unspecified cirrhosis of liver: Secondary | ICD-10-CM | POA: Diagnosis not present

## 2023-02-16 DIAGNOSIS — K5900 Constipation, unspecified: Secondary | ICD-10-CM | POA: Diagnosis not present

## 2023-02-16 DIAGNOSIS — K219 Gastro-esophageal reflux disease without esophagitis: Secondary | ICD-10-CM | POA: Diagnosis not present

## 2023-02-17 LAB — CBC WITH DIFFERENTIAL/PLATELET
Absolute Lymphocytes: 2253 {cells}/uL (ref 850–3900)
Absolute Monocytes: 562 {cells}/uL (ref 200–950)
Basophils Absolute: 48 {cells}/uL (ref 0–200)
Basophils Relative: 0.9 %
Eosinophils Absolute: 482 {cells}/uL (ref 15–500)
Eosinophils Relative: 9.1 %
HCT: 37.2 % (ref 35.0–45.0)
Hemoglobin: 11.5 g/dL — ABNORMAL LOW (ref 11.7–15.5)
MCH: 28.4 pg (ref 27.0–33.0)
MCHC: 30.9 g/dL — ABNORMAL LOW (ref 32.0–36.0)
MCV: 91.9 fL (ref 80.0–100.0)
MPV: 11.5 fL (ref 7.5–12.5)
Monocytes Relative: 10.6 %
Neutro Abs: 1956 {cells}/uL (ref 1500–7800)
Neutrophils Relative %: 36.9 %
Platelets: 258 10*3/uL (ref 140–400)
RBC: 4.05 10*6/uL (ref 3.80–5.10)
RDW: 13.4 % (ref 11.0–15.0)
Total Lymphocyte: 42.5 %
WBC: 5.3 10*3/uL (ref 3.8–10.8)

## 2023-02-17 LAB — COMPREHENSIVE METABOLIC PANEL
AG Ratio: 1.6 (calc) (ref 1.0–2.5)
ALT: 12 U/L (ref 6–29)
AST: 23 U/L (ref 10–35)
Albumin: 4.1 g/dL (ref 3.6–5.1)
Alkaline phosphatase (APISO): 74 U/L (ref 37–153)
BUN: 17 mg/dL (ref 7–25)
CO2: 25 mmol/L (ref 20–32)
Calcium: 10.6 mg/dL — ABNORMAL HIGH (ref 8.6–10.4)
Chloride: 105 mmol/L (ref 98–110)
Creat: 0.95 mg/dL (ref 0.60–1.00)
Globulin: 2.6 g/dL (ref 1.9–3.7)
Glucose, Bld: 111 mg/dL — ABNORMAL HIGH (ref 65–99)
Potassium: 4.5 mmol/L (ref 3.5–5.3)
Sodium: 141 mmol/L (ref 135–146)
Total Bilirubin: 0.5 mg/dL (ref 0.2–1.2)
Total Protein: 6.7 g/dL (ref 6.1–8.1)

## 2023-02-17 LAB — AFP TUMOR MARKER: AFP-Tumor Marker: 5.1 ng/mL

## 2023-02-17 LAB — PROTIME-INR
INR: 1
Prothrombin Time: 10.9 s (ref 9.0–11.5)

## 2023-02-18 ENCOUNTER — Ambulatory Visit (HOSPITAL_COMMUNITY)
Admission: RE | Admit: 2023-02-18 | Discharge: 2023-02-18 | Disposition: A | Discharge status: Home / Self Care | Payer: Medicare HMO | Source: Ambulatory Visit | Attending: Internal Medicine | Admitting: Internal Medicine

## 2023-02-18 DIAGNOSIS — K7689 Other specified diseases of liver: Secondary | ICD-10-CM | POA: Diagnosis not present

## 2023-02-18 DIAGNOSIS — K7460 Unspecified cirrhosis of liver: Secondary | ICD-10-CM | POA: Insufficient documentation

## 2023-02-18 DIAGNOSIS — R932 Abnormal findings on diagnostic imaging of liver and biliary tract: Secondary | ICD-10-CM | POA: Diagnosis not present

## 2023-02-18 DIAGNOSIS — Z9049 Acquired absence of other specified parts of digestive tract: Secondary | ICD-10-CM | POA: Diagnosis not present

## 2023-02-23 ENCOUNTER — Other Ambulatory Visit: Payer: Self-pay

## 2023-02-23 DIAGNOSIS — R7989 Other specified abnormal findings of blood chemistry: Secondary | ICD-10-CM

## 2023-02-23 DIAGNOSIS — R634 Abnormal weight loss: Secondary | ICD-10-CM

## 2023-02-23 DIAGNOSIS — K7460 Unspecified cirrhosis of liver: Secondary | ICD-10-CM

## 2023-03-01 DIAGNOSIS — R634 Abnormal weight loss: Secondary | ICD-10-CM | POA: Diagnosis not present

## 2023-03-01 DIAGNOSIS — K5900 Constipation, unspecified: Secondary | ICD-10-CM | POA: Diagnosis not present

## 2023-03-01 DIAGNOSIS — K219 Gastro-esophageal reflux disease without esophagitis: Secondary | ICD-10-CM | POA: Diagnosis not present

## 2023-03-01 DIAGNOSIS — K7460 Unspecified cirrhosis of liver: Secondary | ICD-10-CM | POA: Diagnosis not present

## 2023-03-01 DIAGNOSIS — R7989 Other specified abnormal findings of blood chemistry: Secondary | ICD-10-CM | POA: Diagnosis not present

## 2023-03-02 LAB — CALCIUM, IONIZED: Calcium, Ion: 5.5 mg/dL (ref 4.7–5.5)

## 2023-03-03 LAB — AFP TUMOR MARKER: AFP-Tumor Marker: 5.7 ng/mL

## 2023-03-12 DIAGNOSIS — E1165 Type 2 diabetes mellitus with hyperglycemia: Secondary | ICD-10-CM | POA: Diagnosis not present

## 2023-03-22 ENCOUNTER — Encounter (HOSPITAL_COMMUNITY): Payer: Self-pay

## 2023-03-22 ENCOUNTER — Encounter (HOSPITAL_COMMUNITY)
Admission: RE | Admit: 2023-03-22 | Discharge: 2023-03-22 | Disposition: A | Discharge status: Home / Self Care | Payer: Medicare HMO | Source: Ambulatory Visit | Attending: Internal Medicine | Admitting: Internal Medicine

## 2023-03-22 ENCOUNTER — Other Ambulatory Visit: Payer: Self-pay

## 2023-03-22 NOTE — Progress Notes (Signed)
PAT visit completed. Pt verbalized understanding on procedure instructions and arrival time.   

## 2023-03-22 NOTE — Patient Instructions (Signed)
 Connie Kent  03/22/2023     @PREFPERIOPPHARMACY @   Your procedure is scheduled on 03/24/2023.   [facility] to [facility] at 11:00 [facility].M.   Call this number if you have problems the morning of surgery:  [phone]  If you experience any cold or flu symptoms such as cough, fever, chills, shortness of breath, etc. between now and your scheduled surgery, please notify us at the above number.   Remember:   Please Follow the Diet and Prep Instructions given to you by Dr Luvenia Starch office.    You may drink clear liquids until 9:00AM .  Clear liquids allowed are:                    Water, Juice (No red color; non-citric and without pulp; diabetics please choose diet or no sugar options), Carbonated beverages (diabetics please choose diet or no sugar options), and Clear Sports drink (No red color; diabetics please choose diet or no sugar options)    Take these medicines the morning of surgery with A SIP OF WATER : Esomeprazole Fluoxetine Hydrocodone Levothyroxine Pantoprazole Meclizine and Prednisone     Do not wear jewelry, make-up or nail polish, including gel polish,  artificial nails, or any other type of covering on natural nails (fingers and  toes).  Do not wear lotions, powders, or perfumes, or deodorant.  Do not shave 48 hours prior to surgery.  Men may shave face and neck.  Do not bring valuables to the hospital.  [facility] is not responsible for any belongings or valuables.  Contacts, dentures or bridgework may not be worn into surgery.  Leave your suitcase in the car.  After surgery it may be brought to your room.  For patients admitted to the hospital, discharge time will be determined by your treatment team.  Patients discharged the day of surgery will not be allowed to drive home.   Name and phone number of your driver:   Family Special instructions:  N/A  Please read over the following fact sheets that you were given. Care and Recovery After Surgery    Upper  Endoscopy, Adult Upper endoscopy is a procedure to look inside the upper GI (gastrointestinal) tract. The upper GI tract is made up of: The esophagus. This is the part of the body that moves food from your mouth to your stomach. The stomach. The duodenum. This is the first part of your small intestine. This procedure is also called esophagogastroduodenoscopy (EGD) or gastroscopy. In this procedure, your health care provider passes a thin, flexible tube (endoscope) through your mouth and down your esophagus into your stomach and into your duodenum. A small camera is attached to the end of the tube. Images from the camera appear on a monitor in the exam room. During this procedure, your health care provider may also remove a small piece of tissue to be sent to a lab and examined under a microscope (biopsy). Your health care provider may do an upper endoscopy to diagnose cancers of the upper GI tract. You may also have this procedure to find the cause of other conditions, such as: Stomach pain. Heartburn. Pain or problems when swallowing. Nausea and vomiting. Stomach bleeding. Stomach ulcers. Tell a health care provider about: Any allergies you have. All medicines you are taking, including vitamins, herbs, eye drops, creams, and over-the-counter medicines. Any problems you or family members have had with anesthetic medicines. Any bleeding problems you have. Any surgeries you have had. Any  medical conditions you have. Whether you are pregnant or may be pregnant. What are the risks? Your healthcare provider will talk with you about risks. These may include: Infection. Bleeding. Allergic reactions to medicines. A tear or hole (perforation) in the esophagus, stomach, or duodenum. What happens before the procedure? When to stop eating and drinking Follow instructions from your health care provider about what you may eat and drink. These may include: 8 hours before your procedure Stop eating  most foods. Do not eat meat, fried foods, or fatty foods. Eat only light foods, such as toast or crackers. All liquids are okay except energy drinks and alcohol. 6 hours before your procedure Stop eating. Drink only clear liquids, such as water, clear fruit juice, black coffee, plain tea, and sports drinks. Do not drink energy drinks or alcohol. 2 hours before your procedure Stop drinking all liquids. You may be allowed to take medicines with small sips of water. If you do not follow your health care provider's instructions, your procedure may be delayed or canceled. Medicines Ask your health care provider about: Changing or stopping your regular medicines. This is especially important if you are taking diabetes medicines or blood thinners. Taking medicines such as aspirin and ibuprofen. These medicines can thin your blood. Do not take these medicines unless your health care provider tells you to take them. Taking over-the-counter medicines, vitamins, herbs, and supplements. General instructions If you will be going home right after the procedure, plan to have a responsible adult: Take you home from the hospital or clinic. You will not be allowed to drive. Care for you for the time you are told. What happens during the procedure?  An IV will be inserted into one of your veins. You may be given one or more of the following: A medicine to help you relax (sedative). A medicine to numb the throat (local anesthetic). You will lie on your left side on an exam table. Your health care provider will pass the endoscope through your mouth and down your esophagus. Your health care provider will use the scope to check the inside of your esophagus, stomach, and duodenum. Biopsies may be taken. The endoscope will be removed. The procedure may vary among health care providers and hospitals. What happens after the procedure? Your blood pressure, heart rate, breathing rate, and blood oxygen level will  be monitored until you leave the hospital or clinic. When your throat is no longer numb, you may be given some fluids to drink. If you were given a sedative during the procedure, it can affect you for several hours. Do not drive or operate machinery until your health care provider says that it is safe. It is up to you to get the results of your procedure. Ask your health care provider, or the department that is doing the procedure, when your results will be ready. Contact a health care provider if you: Have a sore throat that lasts longer than 1 day. Have a fever. Get help right away if you: Vomit blood or your vomit looks like coffee grounds. Have bloody, black, or tarry stools. Have a very bad sore throat or you cannot swallow. Have difficulty breathing or very bad pain in your chest or abdomen. These symptoms may be an emergency. Get help right away. Call 911. Do not wait to see if the symptoms will go away. Do not drive yourself to the hospital. Summary Upper endoscopy is a procedure to look inside the upper GI tract. During the procedure,  an IV will be inserted into one of your veins. You may be given a medicine to help you relax. The endoscope will be passed through your mouth and down your esophagus. Follow instructions from your health care provider about what you can eat and drink. This information is not intended to replace advice given to you by your health care provider. Make sure you discuss any questions you have with your health care provider. Document Revised: 04/09/2021 Document Reviewed: 04/09/2021 Elsevier Patient Education  2024 [employer]  Monitored Anesthesia Care Anesthesia refers to the techniques, procedures, and medicines that help a person stay safe and comfortable during surgery. Monitored anesthesia care, or sedation, is one type of anesthesia. You may have sedation if you do not need to be asleep for your procedure. Procedures that use sedation may  include: Surgery to remove cataracts from your eyes. A dental procedure. A biopsy. This is when a tissue sample is removed and looked at under a microscope. You will be watched closely during your procedure. Your level of sedation or type of anesthesia may be changed to fit your needs. Tell a health care provider about: Any allergies you have. All medicines you are taking, including vitamins, herbs, eye drops, creams, and over-the-counter medicines. Any problems you or family members have had with anesthesia. Any bleeding problems you have. Any surgeries you have had. Any medical conditions or illnesses you have. This includes sleep apnea, cough, fever, or the flu. Whether you are pregnant or may be pregnant. Whether you use cigarettes, alcohol, or drugs. Any use of steroids, whether by mouth or as a cream. What are the risks? Your health care provider will talk with you about risks. These may include: Getting too much medicine (oversedation). Nausea. Allergic reactions to medicines. Trouble breathing. If this happens, a breathing tube may be used to help you breathe. It will be removed when you are awake and breathing on your own. Heart trouble. Lung trouble. Confusion that gets better with time (emergence delirium). What happens before the procedure? When to stop eating and drinking Follow instructions from your health care provider about what you may eat and drink. These may include: 8 hours before your procedure Stop eating most foods. Do not eat meat, fried foods, or fatty foods. Eat only light foods, such as toast or crackers. All liquids are okay except energy drinks and alcohol. 6 hours before your procedure Stop eating. Drink only clear liquids, such as water, clear fruit juice, black coffee, plain tea, and sports drinks. Do not drink energy drinks or alcohol. 2 hours before your procedure Stop drinking all liquids. You may be allowed to take medicines with small sips of  water. If you do not follow your health care provider's instructions, your procedure may be delayed or canceled. Medicines Ask your health care provider about: Changing or stopping your regular medicines. These include any diabetes medicines or blood thinners you take. Taking medicines such as aspirin and ibuprofen. These medicines can thin your blood. Do not take them unless your health care provider tells you to. Taking over-the-counter medicines, vitamins, herbs, and supplements. Testing You may have an exam or testing. You may have a blood or urine sample taken. General instructions Do not use any products that contain nicotine or tobacco for at least 4 weeks before the procedure. These products include cigarettes, chewing tobacco, and vaping devices, such as e-cigarettes. If you need help quitting, ask your health care provider. If you will be going home right after  the procedure, plan to have a responsible adult: Take you home from the hospital or clinic. You will not be allowed to drive. Care for you for the time you are told. What happens during the procedure?  Your blood pressure, heart rate, breathing, level of pain, and blood oxygen level will be monitored. An IV will be inserted into one of your veins. You may be given: A sedative. This helps you relax. Anesthesia. This will: Numb certain areas of your body. Make you fall asleep for surgery. You will be given medicines as needed to keep you comfortable. The more medicine you are given, the deeper your level of sedation will be. Your level of sedation may be changed to fit your needs. There are three levels of sedation: Mild sedation. At this level, you may feel awake and relaxed. You will be able to follow directions. Moderate sedation. At this level, you will be sleepy. You may not remember the procedure. Deep sedation. At this level, you will be asleep. You will not remember the procedure. How you get the medicines will  depend on your age and the procedure. They may be given as: A pill. This may be taken by mouth (orally) or inserted into the rectum. An injection. This may be into a vein or muscle. A spray through the nose. After your procedure is over, the medicine will be stopped. The procedure may vary among health care providers and hospitals. What happens after the procedure? Your blood pressure, heart rate, breathing rate, and blood oxygen level will be monitored until you leave the hospital or clinic. You may feel sleepy, clumsy, or nauseous. You may not remember what happened during or after the procedure. Sedation can affect you for several hours. Do not drive or use machinery until your health care provider says that it is safe. This information is not intended to replace advice given to you by your health care provider. Make sure you discuss any questions you have with your health care provider. Document Revised: 05/26/2021 Document Reviewed: 05/26/2021 Elsevier Patient Education  2024 [employer].

## 2023-03-24 ENCOUNTER — Encounter (HOSPITAL_COMMUNITY): Payer: Self-pay | Admitting: Internal Medicine

## 2023-03-24 ENCOUNTER — Ambulatory Visit (HOSPITAL_COMMUNITY)
Admission: RE | Admit: 2023-03-24 | Discharge: 2023-03-24 | Disposition: A | Discharge status: Home / Self Care | Payer: Medicare HMO | Attending: Internal Medicine | Admitting: Internal Medicine

## 2023-03-24 ENCOUNTER — Ambulatory Visit (HOSPITAL_COMMUNITY): Admitting: Anesthesiology

## 2023-03-24 ENCOUNTER — Encounter (HOSPITAL_COMMUNITY)
Admission: RE | Disposition: A | Discharge status: Home / Self Care | Payer: Self-pay | Source: Home / Self Care | Attending: Internal Medicine

## 2023-03-24 DIAGNOSIS — K449 Diaphragmatic hernia without obstruction or gangrene: Secondary | ICD-10-CM

## 2023-03-24 DIAGNOSIS — K7460 Unspecified cirrhosis of liver: Secondary | ICD-10-CM | POA: Insufficient documentation

## 2023-03-24 DIAGNOSIS — Z79890 Hormone replacement therapy (postmenopausal): Secondary | ICD-10-CM | POA: Diagnosis not present

## 2023-03-24 DIAGNOSIS — I10 Essential (primary) hypertension: Secondary | ICD-10-CM | POA: Insufficient documentation

## 2023-03-24 DIAGNOSIS — F419 Anxiety disorder, unspecified: Secondary | ICD-10-CM | POA: Insufficient documentation

## 2023-03-24 DIAGNOSIS — E119 Type 2 diabetes mellitus without complications: Secondary | ICD-10-CM | POA: Insufficient documentation

## 2023-03-24 DIAGNOSIS — R1310 Dysphagia, unspecified: Secondary | ICD-10-CM | POA: Diagnosis not present

## 2023-03-24 DIAGNOSIS — K219 Gastro-esophageal reflux disease without esophagitis: Secondary | ICD-10-CM | POA: Insufficient documentation

## 2023-03-24 DIAGNOSIS — E039 Hypothyroidism, unspecified: Secondary | ICD-10-CM | POA: Diagnosis not present

## 2023-03-24 DIAGNOSIS — K3189 Other diseases of stomach and duodenum: Secondary | ICD-10-CM | POA: Diagnosis not present

## 2023-03-24 DIAGNOSIS — K766 Portal hypertension: Secondary | ICD-10-CM | POA: Diagnosis not present

## 2023-03-24 DIAGNOSIS — Z87891 Personal history of nicotine dependence: Secondary | ICD-10-CM | POA: Diagnosis not present

## 2023-03-24 DIAGNOSIS — K222 Esophageal obstruction: Secondary | ICD-10-CM

## 2023-03-24 DIAGNOSIS — Z7984 Long term (current) use of oral hypoglycemic drugs: Secondary | ICD-10-CM | POA: Insufficient documentation

## 2023-03-24 HISTORY — PX: MALONEY DILATION: SHX5535

## 2023-03-24 HISTORY — PX: ESOPHAGOGASTRODUODENOSCOPY (EGD) WITH PROPOFOL: SHX5813

## 2023-03-24 LAB — GLUCOSE, CAPILLARY
Glucose-Capillary: 74 mg/dL (ref 70–99)
Glucose-Capillary: 68 mg/dL — ABNORMAL LOW (ref 70–99)
Glucose-Capillary: 143 mg/dL — ABNORMAL HIGH (ref 70–99)

## 2023-03-24 SURGERY — ESOPHAGOGASTRODUODENOSCOPY (EGD) WITH PROPOFOL
Anesthesia: General

## 2023-03-24 MED ORDER — PROPOFOL 10 MG/ML IV BOLUS
INTRAVENOUS | Status: DC | PRN
  Administered 2023-03-24: 50 mg via INTRAVENOUS
  Administered 2023-03-24: 80 mg via INTRAVENOUS
  Administered 2023-03-24: 20 mg via INTRAVENOUS

## 2023-03-24 MED ORDER — DEXTROSE 50 % IV SOLN
INTRAVENOUS | Status: AC
  Administered 2023-03-24: 25 mL via INTRAVENOUS
  Filled 2023-03-24: qty 50

## 2023-03-24 MED ORDER — LIDOCAINE HCL (CARDIAC) PF 100 MG/5ML IV SOSY
PREFILLED_SYRINGE | INTRAVENOUS | Status: DC | PRN
  Administered 2023-03-24: 60 mg via INTRAVENOUS

## 2023-03-24 MED ORDER — SCOPOLAMINE 1 MG/3DAYS TD PT72
MEDICATED_PATCH | TRANSDERMAL | Status: AC
  Filled 2023-03-24: qty 1

## 2023-03-24 MED ORDER — LACTATED RINGERS IV SOLN
INTRAVENOUS | Status: DC | PRN
Start: 2023-03-24 — End: 2023-03-24

## 2023-03-24 MED ORDER — LACTATED RINGERS IV SOLN: INTRAVENOUS | Status: DC

## 2023-03-24 MED ORDER — DEXTROSE 50 % IV SOLN: 25.0000 mL | INTRAVENOUS | Status: AC

## 2023-03-24 MED ORDER — DEXTROSE 50 % IV SOLN
INTRAVENOUS | Status: AC
  Administered 2023-03-24: 25 g via INTRAVENOUS
  Filled 2023-03-24: qty 50

## 2023-03-24 MED ORDER — DEXTROSE 50 % IV SOLN: 25.0000 g | Freq: Once | INTRAVENOUS | Status: AC

## 2023-03-24 NOTE — H&P (Signed)
 @LOGO @   Primary Care Physician:  Kirstie Peri, MD Primary Gastroenterologist:  Dr. Jena Gauss  Pre-Procedure History & Physical: HPI:  Connie Kent is a 78 y.o. female here for   Further evaluation poorly controlled GERD/dysphagia.  History of MASLD/ cirrhosis.  Past Medical History:  Diagnosis Date   Diabetes mellitus without complication (HCC)    GERD (gastroesophageal reflux disease)    H. pylori infection    Confirmed eradication on EGD in September 2022   Hyperlipidemia    Hypertension    Thyroid disease     Past Surgical History:  Procedure Laterality Date   ABDOMINAL HYSTERECTOMY     BIOPSY  09/23/2020   Procedure: BIOPSY;  Surgeon: Corbin Ade, MD;  Location: [facility];  Service: Endoscopy;;   breast biopsies     BREAST BIOPSY Bilateral    [place]   CHOLECYSTECTOMY     COLONOSCOPY  06/02/1999   Dr. Jena Gauss; internal and external hemorrhoids, poor prep.   COLONOSCOPY  09/08/2002   Dr. Jena Gauss; friable internal hemorrhoids, scattered left-sided diverticula, otherwise normal colon and TI.   COLONOSCOPY WITH PROPOFOL N/A 02/25/2022   Procedure: COLONOSCOPY WITH PROPOFOL;  Surgeon: Corbin Ade, MD;  Location: [facility];  Service: Endoscopy;  Laterality: N/A;  7:30 am   ESOPHAGOGASTRODUODENOSCOPY  2003   Dr. Jena Gauss; normal esophagus s/p empiric dilation, superficial antral erosions.   ESOPHAGOGASTRODUODENOSCOPY  09/08/2002   Small hiatal hernia, otherwise normal.   ESOPHAGOGASTRODUODENOSCOPY  08/22/2007   Dr. Jena Gauss; normal esophagus s/p empiric dilation, small hiatal hernia, benign-appearing polypoid antral mucosa, otherwise normal.   ESOPHAGOGASTRODUODENOSCOPY (EGD) WITH PROPOFOL N/A 09/23/2020   Procedure: ESOPHAGOGASTRODUODENOSCOPY (EGD) WITH PROPOFOL;  Surgeon: Corbin Ade, MD;  Location: [facility];  Service: Endoscopy;  Laterality: N/A;  7:30am   EYE SURGERY     HEMORRHOID SURGERY     MALONEY DILATION N/A 09/23/2020   Procedure: Elease Hashimoto DILATION;   Surgeon: Corbin Ade, MD;  Location: [facility];  Service: Endoscopy;  Laterality: N/A;   POLYPECTOMY  02/25/2022   Procedure: POLYPECTOMY;  Surgeon: Corbin Ade, MD;  Location: [facility];  Service: Endoscopy;;   THROAT SURGERY     Biopsy   XI ROBOTIC ASSISTED VENTRAL HERNIA N/A 07/14/2022   Procedure: XI ROBOTIC ASSISTED VENTRAL HERNIA W/ MESH;  Surgeon: Franky Macho, MD;  Location: [facility];  Service: General;  Laterality: N/A;    Prior to Admission medications   Medication Sig Start Date End Date Taking? Authorizing Provider  acetaminophen (TYLENOL) 500 MG tablet Take 1,000 mg by mouth every 6 (six) hours as needed for moderate pain.   Yes [provider]  Biotin 10 MG TABS Take 10 mg by mouth daily.   Yes [provider]  Cyanocobalamin (B-12) 2500 MCG TABS Take 2,500 mcg by mouth every other day.   Yes [provider]  esomeprazole (NEXIUM) 40 MG capsule Take 1 capsule (40 mg total) by mouth daily at 12 noon. 02/12/23  Yes Christelle Igoe, Gerrit Friends, MD  FLUoxetine (PROZAC) 20 MG capsule Take 20 mg by mouth daily. 05/04/20  Yes [provider]  hydrochlorothiazide (HYDRODIURIL) 25 MG tablet Take 25 mg by mouth daily. 03/26/21  Yes [provider]  HYDROcodone-acetaminophen (NORCO) 5-325 MG tablet Take 1 tablet by mouth every 6 (six) hours as needed for moderate pain. 07/29/22  Yes Franky Macho, MD  levothyroxine (SYNTHROID) 88 MCG tablet Take 88 mcg by mouth daily before breakfast. 03/17/22  Yes [provider]  meclizine (DRAMAMINE) 25 MG tablet Take 25 mg by mouth 3 (three) times daily as needed for nausea.   Yes [provider]  metFORMIN (GLUCOPHAGE) 500 MG tablet Take 500 mg by mouth daily. 12/30/21  Yes [provider]  penicillin v potassium (VEETID) 500 MG tablet Take 500 mg by mouth daily. 01/07/22  Yes [provider]  predniSONE (DELTASONE) 5 MG tablet Take 5 mg by mouth daily as needed (back pain).    Yes [provider]  vitamin E 180 MG (400 UNITS) capsule Take 400 Units by mouth every other day.   Yes [provider]  metoprolol tartrate (LOPRESSOR) 25 MG tablet Take 25 mg by mouth daily. Patient not taking: Reported on 02/12/2023 06/04/16   [provider]  nitrofurantoin, macrocrystal-monohydrate, (MACROBID) 100 MG capsule Take 1 capsule (100 mg total) by mouth 2 (two) times daily. 11/27/22   McKenzie, Mardene Celeste, MD  pantoprazole (PROTONIX) 40 MG tablet Take 1 tablet by mouth once daily 11/25/22   Kacey Vicuna, Gerrit Friends, MD    Allergies as of 02/12/2023 - Review Complete 02/12/2023  Allergen Reaction Noted   Cyclobenzaprine  03/12/2016   Tramadol Other (See Comments) 03/12/2016    Family History  Problem Relation Age of Onset   Cancer Mother    Heart failure Father     Social History   Socioeconomic History   Marital status: Married    Spouse name: Not on file   Number of children: 4   Years of education: Not on file   Highest education level: Not on file  Occupational History   Occupation: Retired  Tobacco Use   Smoking status: Former    Types: Cigarettes    Passive exposure: Past   Smokeless tobacco: Never  Vaping Use   Vaping status: Never Used  Substance and Sexual Activity   Alcohol use: No   Drug use: No   Sexual activity: Not Currently  Other Topics Concern   Not on file  Social History Narrative   Not on file   Social Drivers of Health   Financial Resource Strain: Not on file  Food Insecurity: Not on file  Transportation Needs: Not on file  Physical Activity: Not on file  Stress: Not on file  Social Connections: Not on file  Intimate Partner Violence: Not on file    Review of Systems: See HPI, otherwise negative ROS  Physical Exam: BP (!) 143/60   Pulse 74   Resp 18   Ht 5\' 1"  (1.549 m)   Wt 66.9 kg   SpO2 97%   BMI 27.87 kg/m  General:   Alert,  Well-developed, well-nourished, pleasant and cooperative in  NAD Neck:  Supple; no masses or thyromegaly. No significant cervical adenopathy. Lungs:  Clear throughout to auscultation.   No wheezes, crackles, or rhonchi. No acute distress. Heart:  Regular rate and rhythm; no murmurs, clicks, rubs,  or gallops. Abdomen: Non-distended, normal bowel sounds.  Soft and nontender without appreciable mass or hepatosplenomegaly.   Impression/Plan:    78 year old lady with cirrhosis poorly controlled GERD now with esophageal dysphagia.  Here to screen for varices and to further evaluate dysphagia. The risks, benefits, limitations, alternatives and imponderables have been reviewed with the patient. Potential for esophageal dilation, biopsy, etc. have also been reviewed.  Questions have been answered. All parties agreeable.      Notice: This dictation was prepared with Dragon dictation along with smaller phrase technology. Any transcriptional errors that result from  this process are unintentional and may not be corrected upon review.

## 2023-03-24 NOTE — Anesthesia Preprocedure Evaluation (Signed)
 Anesthesia Evaluation  Patient identified by MRN, date of birth, ID band Patient awake    Reviewed: Allergy & Precautions, NPO status , Patient's Chart, lab work & pertinent test results  Airway Mallampati: II  TM Distance: >3 FB Neck ROM: Full    Dental  (+) Edentulous Upper, Dental Advisory Given   Pulmonary former smoker   Pulmonary exam normal        Cardiovascular hypertension, negative cardio ROS Normal cardiovascular exam Rhythm:Regular Rate:Normal     Neuro/Psych  PSYCHIATRIC DISORDERS Anxiety Depression     Neuromuscular disease    GI/Hepatic ,GERD  ,,  Endo/Other  diabetesHypothyroidism    Renal/GU      Musculoskeletal  (+) Arthritis ,    Abdominal Normal abdominal exam  (+)   Peds  Hematology   Anesthesia Other Findings   Reproductive/Obstetrics                             Anesthesia Physical Anesthesia Plan  ASA: 2  Anesthesia Plan: General   Post-op Pain Management:    Induction: Intravenous  PONV Risk Score and Plan: Propofol infusion  Airway Management Planned: Nasal Cannula and Natural Airway  Additional Equipment:   Intra-op Plan:   Post-operative Plan:   Informed Consent: I have reviewed the patients History and Physical, chart, labs and discussed the procedure including the risks, benefits and alternatives for the proposed anesthesia with the patient or authorized representative who has indicated his/her understanding and acceptance.     Dental advisory given  Plan Discussed with: Anesthesiologist  Anesthesia Plan Comments:        Anesthesia Quick Evaluation

## 2023-03-24 NOTE — Op Note (Signed)
 [facility] Patient Name: Connie Kent Procedure Date: 03/24/2023 2:21 PM MRN: [id number] Date of Birth: December 02, 1945 Attending MD: Connie Kent , MD, [id number] CSN: [id number] Age: 78 Admit Type: Outpatient Procedure:                Upper GI endoscopy Indications:              Dysphagia Providers:                Connie Pac, MD, Nena Polio, RN, Lennice Sites Technician, Technician Referring MD:              Medicines:                Propofol per Anesthesia Complications:            No immediate complications. Estimated Blood Loss:     Estimated blood loss: none. Procedure:                Pre-Anesthesia Assessment:                           - Prior to the procedure, a History and Physical                            was performed, and patient medications and                            allergies were reviewed. The patient's tolerance of                            previous anesthesia was also reviewed. The risks                            and benefits of the procedure and the sedation                            options and risks were discussed with the patient.                            All questions were answered, and informed consent                            was obtained. Prior Anticoagulants: The patient has                            taken no anticoagulant or antiplatelet agents. ASA                            Grade Assessment: III - A patient with severe                            systemic disease. After reviewing the risks and  benefits, the patient was deemed in satisfactory                            condition to undergo the procedure.                           After obtaining informed consent, the endoscope was                            passed under direct vision. Throughout the                            procedure, the patient's blood pressure, pulse, and                            oxygen saturations  were monitored continuously. The                            GIF-H190 ([id number]) scope was introduced through the                            mouth, and advanced to the second part of duodenum.                            The upper GI endoscopy was accomplished without                            difficulty. The patient tolerated the procedure                            well. Scope In: 2:47:03 PM Scope Out: 2:51:46 PM Total Procedure Duration: 0 hours 4 minutes 43 seconds  Findings:      The examined esophagus was normal. The scope was withdrawn. Dilation was       performed with a Maloney dilator with mild resistance at 54 Fr. The       dilation site was examined following endoscope reinsertion and showed no       change. Estimated blood loss: none.      A small hiatal hernia was present.      Mild portal hypertensive gastropathy was found in the entire examined       stomach.      The duodenal bulb and second portion of the duodenum were normal. Impression:               - Normal esophagus. Dilated.                           - Small hiatal hernia.                           - Portal hypertensive gastropathy.                           - Normal duodenal bulb and second portion of the  duodenum.                           - No specimens collected. Since Nexium is now                            controlling reflux symptoms better than Protonix we                            will continue that medication. Moderate Sedation:      Moderate (conscious) sedation was personally administered by an       anesthesia professional. The following parameters were monitored: oxygen       saturation, heart rate, blood pressure, respiratory rate, EKG, adequacy       of pulmonary ventilation, and response to care. Recommendation:           - Patient has a contact number available for                            emergencies. The signs and symptoms of potential                             delayed complications were discussed with the                            patient. Return to normal activities tomorrow.                            Written discharge instructions were provided to the                            patient.                           - Advance diet as tolerated.                           - Continue present medications.                           - Return to my office in 3 months. Timing of future                            EGD to be determined by clinical parameters. Procedure Code(s):        --- Professional ---                           [phone], Esophagogastroduodenoscopy, flexible,                            transoral; diagnostic, including collection of                            specimen(s) by brushing or washing, when performed                            (  separate procedure)                           43450, Dilation of esophagus, by unguided sound or                            bougie, single or multiple passes Diagnosis Code(s):        --- Professional ---                           K44.9, Diaphragmatic hernia without obstruction or                            gangrene                           K76.6, Portal hypertension                           K31.89, Other diseases of stomach and duodenum                           R13.10, Dysphagia, unspecified CPT copyright 2022 American [employer]. All rights reserved. The codes documented in this report are preliminary and upon coder review may  be revised to meet current compliance requirements. Connie Kent. Connie Frandsen, MD Connie Pac, MD 03/24/2023 3:02:20 PM This report has been signed electronically. Number of Addenda: 0

## 2023-03-24 NOTE — Discharge Instructions (Signed)
 EGD Discharge instructions Please read the instructions outlined below and refer to this sheet in the next few weeks. These discharge instructions provide you with general information on caring for yourself after you leave the hospital. Your doctor may also give you specific instructions. While your treatment has been planned according to the most current medical practices available, unavoidable complications occasionally occur. If you have any problems or questions after discharge, please call your doctor. ACTIVITY You may resume your regular activity but move at a slower pace for the next 24 hours.  Take frequent rest periods for the next 24 hours.  Walking will help expel (get rid of) the air and reduce the bloated feeling in your abdomen.  No driving for 24 hours (because of the anesthesia (medicine) used during the test).  You may shower.  Do not sign any important legal documents or operate any machinery for 24 hours (because of the anesthesia used during the test).  NUTRITION Drink plenty of fluids.  You may resume your normal diet.  Begin with a light meal and progress to your normal diet.  Avoid alcoholic beverages for 24 hours or as instructed by your caregiver.  MEDICATIONS You may resume your normal medications unless your caregiver tells you otherwise.  WHAT YOU CAN EXPECT TODAY You may experience abdominal discomfort such as a feeling of fullness or "gas" pains.  FOLLOW-UP Your doctor will discuss the results of your test with you.  SEEK IMMEDIATE MEDICAL ATTENTION IF ANY OF THE FOLLOWING OCCUR: Excessive nausea (feeling sick to your stomach) and/or vomiting.  Severe abdominal pain and distention (swelling).  Trouble swallowing.  Temperature over 101 F (37.8 C).  Rectal bleeding or vomiting of blood.      No esophageal varices found today  Your esophagus was dilated   continue Nexium 40 mg daily  Office visit with [place] in 3 months  At patient request, I called Merrilyn Puma at [phone] -  call rolled to voicemail -  left a detailed message.

## 2023-03-24 NOTE — Transfer of Care (Signed)
 Immediate Anesthesia Transfer of Care Note  Patient: Connie Kent  Procedure(s) Performed: ESOPHAGOGASTRODUODENOSCOPY (EGD) WITH PROPOFOL DILATION, ESOPHAGUS, USING MALONEY DILATOR  Patient Location: Short Stay  Anesthesia Type:General  Level of Consciousness: drowsy  Airway & Oxygen Therapy: Patient Spontanous Breathing  Post-op Assessment: Report given to RN and Post -op Vital signs reviewed and stable  Post vital signs: Reviewed and stable  Last Vitals:  Vitals Value Taken Time  BP 97/50 03/24/23 1455  Temp 36.4 C 03/24/23 1455  Pulse 83 03/24/23 1455  Resp 16 03/24/23 1455  SpO2 95 % 03/24/23 1455    Last Pain:  Vitals:   03/24/23 1455  TempSrc: Oral  PainSc: 0-No pain      Patients Stated Pain Goal: 6 (03/24/23 1055)  Complications: No notable events documented.

## 2023-03-25 ENCOUNTER — Encounter (HOSPITAL_COMMUNITY): Payer: Self-pay | Admitting: Internal Medicine

## 2023-03-26 NOTE — Anesthesia Postprocedure Evaluation (Signed)
 Anesthesia Post Note  Patient: Connie Kent  Procedure(s) Performed: ESOPHAGOGASTRODUODENOSCOPY (EGD) WITH PROPOFOL DILATION, ESOPHAGUS, USING MALONEY DILATOR  Patient location during evaluation: Phase II Anesthesia Type: General Level of consciousness: awake Pain management: pain level controlled Vital Signs Assessment: post-procedure vital signs reviewed and stable Respiratory status: spontaneous breathing and respiratory function stable Cardiovascular status: blood pressure returned to baseline and stable Postop Assessment: no headache and no apparent nausea or vomiting Anesthetic complications: no Comments: Late entry   No notable events documented.   Last Vitals:  Vitals:   03/24/23 1455 03/24/23 1500  BP: (!) 97/50 (!) 117/57  Pulse: 83 80  Resp: 16 20  Temp: (!) 36.4 C   SpO2: 95% 96%    Last Pain:  Vitals:   03/25/23 1332  TempSrc:   PainSc: 0-No pain                 Windell Norfolk

## 2023-03-29 ENCOUNTER — Other Ambulatory Visit: Payer: Self-pay

## 2023-03-29 ENCOUNTER — Telehealth: Payer: Self-pay

## 2023-03-29 MED ORDER — LINACLOTIDE 145 MCG PO CAPS
145.0000 ug | ORAL_CAPSULE | Freq: Every day | ORAL | 11 refills | Status: DC
Start: 2023-03-29 — End: 2023-06-22

## 2023-03-29 NOTE — Telephone Encounter (Signed)
 Okay.  Linzess 145 1 capsule daily.  Dispense 30 with 11 refills.

## 2023-03-29 NOTE — Telephone Encounter (Signed)
 Rx sent to pharmacy on file. Pt was made aware.

## 2023-03-29 NOTE — Telephone Encounter (Signed)
 Pt called with a progress report. States that the linzess 145 is working and would like for an Rx to be sent in to the pharmacy.

## 2023-04-11 DIAGNOSIS — E1165 Type 2 diabetes mellitus with hyperglycemia: Secondary | ICD-10-CM | POA: Diagnosis not present

## 2023-04-26 DIAGNOSIS — I10 Essential (primary) hypertension: Secondary | ICD-10-CM | POA: Diagnosis not present

## 2023-04-26 DIAGNOSIS — F339 Major depressive disorder, recurrent, unspecified: Secondary | ICD-10-CM | POA: Diagnosis not present

## 2023-04-26 DIAGNOSIS — R42 Dizziness and giddiness: Secondary | ICD-10-CM | POA: Diagnosis not present

## 2023-04-26 DIAGNOSIS — R109 Unspecified abdominal pain: Secondary | ICD-10-CM | POA: Diagnosis not present

## 2023-04-26 DIAGNOSIS — E1169 Type 2 diabetes mellitus with other specified complication: Secondary | ICD-10-CM | POA: Diagnosis not present

## 2023-04-26 DIAGNOSIS — Z299 Encounter for prophylactic measures, unspecified: Secondary | ICD-10-CM | POA: Diagnosis not present

## 2023-05-12 DIAGNOSIS — E1165 Type 2 diabetes mellitus with hyperglycemia: Secondary | ICD-10-CM | POA: Diagnosis not present

## 2023-05-19 ENCOUNTER — Encounter: Payer: Self-pay | Admitting: Internal Medicine

## 2023-05-19 ENCOUNTER — Ambulatory Visit: Admitting: Student

## 2023-05-19 NOTE — Progress Notes (Deleted)
 Cardiology Office Note    Date:  05/19/2023  ID:  Connie Kent, DOB August 27, 1945, MRN [id number] Cardiologist: None    History of Present Illness:    Connie Kent is a 78 y.o. female with past medical history of HTN, HLD, Type II DM, Hypothyroidism and GERD who presents to the office today for follow-up.  She was last examined by Dr. Rolm Clos in 06/2021 as a New Patient referral for episodes of chest pain and shortness of breath. Episodes of chest pain were overall felt to be atypical as they were mostly right-sided and were a sharp discomfort which would last for few minutes and then go away. EKG showed no acute ST changes. An echocardiogram and stress test were recommended for further assessment. It appears she canceled her stress test and her echocardiogram showed preserved pumping function of her heart with an ejection fraction of 55 to 60%.  She did not have any regional wall motion normalities. Was noted to have Grade 1 DD, normal RV function and mild MR. Was informed to follow-up with Cardiology as needed going forward.  ROS: ***  Studies Reviewed:   EKG: EKG is*** ordered today and demonstrates ***   EKG Interpretation Date/Time:    Ventricular Rate:    PR Interval:    QRS Duration:    QT Interval:    QTC Calculation:   R Axis:      Text Interpretation:         Echocardiogram: 10/2021 IMPRESSIONS     1. Left ventricular ejection fraction, by estimation, is 55 to 60%. The  left ventricle has normal function. The left ventricle has no regional  wall motion abnormalities. Left ventricular diastolic parameters are  consistent with Grade I diastolic  dysfunction (impaired relaxation). The average left ventricular global  longitudinal strain is -21.3 %. The global longitudinal strain is normal.   2. Right ventricular systolic function is normal. The right ventricular  size is normal. There is normal pulmonary artery systolic pressure. The  estimated right ventricular  systolic pressure is 29.6 mmHg.   3. The mitral valve is abnormal, mildly calcified. Mild mitral valve  regurgitation.   4. The aortic valve is tricuspid. There is mild calcification of the  aortic valve. Aortic valve regurgitation is not visualized. Aortic valve  sclerosis is present, with no evidence of aortic valve stenosis.   5. The inferior vena cava is normal in size with greater than 50%  respiratory variability, suggesting right atrial pressure of 3 mmHg.   Comparison(s): No prior Echocardiogram.    Risk Assessment/Calculations:   {Does this patient have ATRIAL FIBRILLATION?:[phone]} No BP recorded.  {Refresh Note OR Click here to enter BP  :1}***         Physical Exam:   VS:  There were no vitals taken for this visit.   Wt Readings from Last 3 Encounters:  03/24/23 147 lb 7.8 oz (66.9 kg)  02/12/23 147 lb 6.4 oz (66.9 kg)  07/14/22 153 lb (69.4 kg)     GEN: Well nourished, well developed in no acute distress NECK: No JVD; No carotid bruits CARDIAC: ***RRR, no murmurs, rubs, gallops RESPIRATORY:  Clear to auscultation without rales, wheezing or rhonchi  ABDOMEN: Appears non-distended. No obvious abdominal masses. EXTREMITIES: No clubbing or cyanosis. No edema.  Distal pedal pulses are 2+ bilaterally.   Assessment and Plan:   1. Chest Pain - ***  2. HTN - ***  3. HLD - LDL was at 138 in 02/2022.  Signed, Dorma Gash, PA-C

## 2023-06-12 DIAGNOSIS — E1165 Type 2 diabetes mellitus with hyperglycemia: Secondary | ICD-10-CM | POA: Diagnosis not present

## 2023-06-22 ENCOUNTER — Encounter: Payer: Self-pay | Admitting: Internal Medicine

## 2023-06-22 ENCOUNTER — Ambulatory Visit (INDEPENDENT_AMBULATORY_CARE_PROVIDER_SITE_OTHER): Admitting: Internal Medicine

## 2023-06-22 VITALS — BP 119/70 | HR 79 | Temp 98.7°F | Ht 61.0 in | Wt 151.8 lb

## 2023-06-22 DIAGNOSIS — R1310 Dysphagia, unspecified: Secondary | ICD-10-CM

## 2023-06-22 DIAGNOSIS — K7460 Unspecified cirrhosis of liver: Secondary | ICD-10-CM | POA: Diagnosis not present

## 2023-06-22 NOTE — Patient Instructions (Signed)
 It was nice to see you again today!  Because you have continued difficulty swallowing, we will proceed with barium pill esophagram, and x-ray to further evaluate swallowing function  Recommend continued good control of diabetes to help with your fatty liver  Continue stool softener daily to facilitate good bowel function.  Continue pantoprazole  40 mg daily  Will go ahead and arrange hepatitis B vaccine  Continue checking your liver with ultrasound every 6 months  Office visit here in 9 months

## 2023-06-22 NOTE — Progress Notes (Unsigned)
 Primary Care Physician:  Theoplis Fix, MD Primary Gastroenterologist:  Dr. Riley Cheadle  Pre-Procedure History & Physical: HPI:  Connie Kent is a 78 y.o. female here for for follow-up.  States she still has trouble swallowing.  Some solids some liquid issues had a normal esophagus recently dilated with large bore Maloney dilator portal gastropathy.  Well compensated NAFLD cirrhosis.  Moves her bowels very well with a stool softener.  Meld of 7 when labs checked earlier this year.  Has not received hepatitis B vaccine.  Past Medical History:  Diagnosis Date   Diabetes mellitus without complication (HCC)    GERD (gastroesophageal reflux disease)    H. pylori infection    Confirmed eradication on EGD in September 2022   Hyperlipidemia    Hypertension    Thyroid  disease     Past Surgical History:  Procedure Laterality Date   ABDOMINAL HYSTERECTOMY     BIOPSY  09/23/2020   Procedure: BIOPSY;  Surgeon: Suzette Espy, MD;  [facility];  Service: Endoscopy;;   breast biopsies     BREAST BIOPSY Bilateral    beghin   CHOLECYSTECTOMY     COLONOSCOPY  06/02/1999   Dr. Riley Cheadle; internal and external hemorrhoids, poor prep.   COLONOSCOPY  09/08/2002   Dr. Riley Cheadle; friable internal hemorrhoids, scattered left-sided diverticula, otherwise normal colon and TI.   COLONOSCOPY WITH PROPOFOL  N/A 02/25/2022   Procedure: COLONOSCOPY WITH PROPOFOL ;  Surgeon: Suzette Espy, MD;  [facility];  Service: Endoscopy;  Laterality: N/A;  7:30 am   ESOPHAGOGASTRODUODENOSCOPY  2003   Dr. Riley Cheadle; normal esophagus s/p empiric dilation, superficial antral erosions.   ESOPHAGOGASTRODUODENOSCOPY  09/08/2002   Small hiatal hernia, otherwise normal.   ESOPHAGOGASTRODUODENOSCOPY  08/22/2007   Dr. Riley Cheadle; normal esophagus s/p empiric dilation, small hiatal hernia, benign-appearing polypoid antral mucosa, otherwise normal.   ESOPHAGOGASTRODUODENOSCOPY (EGD) WITH PROPOFOL  N/A 09/23/2020    Procedure: ESOPHAGOGASTRODUODENOSCOPY (EGD) WITH PROPOFOL ;  Surgeon: Suzette Espy, MD;  [facility];  Service: Endoscopy;  Laterality: N/A;  7:30am   ESOPHAGOGASTRODUODENOSCOPY (EGD) WITH PROPOFOL  N/A 03/24/2023   Procedure: ESOPHAGOGASTRODUODENOSCOPY (EGD) WITH PROPOFOL ;  Surgeon: Suzette Espy, MD;  [facility];  Service: Endoscopy;  Laterality: N/A;  1:00 pm, asa 3   EYE SURGERY     HEMORRHOID SURGERY     MALONEY DILATION N/A 09/23/2020   Procedure: MALONEY DILATION;  Surgeon: Suzette Espy, MD;  [facility];  Service: Endoscopy;  Laterality: N/A;   MALONEY DILATION  03/24/2023   Procedure: DILATION, ESOPHAGUS, USING MALONEY DILATOR;  Surgeon: Suzette Espy, MD;  [facility];  Service: Endoscopy;;   POLYPECTOMY  02/25/2022   Procedure: POLYPECTOMY;  Surgeon: Suzette Espy, MD;  [facility];  Service: Endoscopy;;   THROAT SURGERY     Biopsy   XI ROBOTIC ASSISTED VENTRAL HERNIA N/A 07/14/2022   Procedure: XI ROBOTIC ASSISTED VENTRAL HERNIA W/ MESH;  Surgeon: Alanda Allegra, MD;  Location: [facility];  Service: General;  Laterality: N/A;    Prior to Admission medications   Medication Sig Start Date End Date Taking? Authorizing Provider  acetaminophen  (TYLENOL ) 500 MG tablet Take 1,000 mg by mouth every 6 (six) hours as needed for moderate pain.   Yes [provider]  Biotin 10 MG TABS Take 10 mg by mouth daily.   Yes [provider]  Cyanocobalamin (B-12) 2500 MCG TABS Take 2,500 mcg by mouth every other  day.   Yes [provider]  dicyclomine (BENTYL) 20 MG tablet Take 20 mg by mouth 3 (three) times daily. 04/26/23  Yes [provider]  FLUoxetine (PROZAC) 20 MG capsule Take 20 mg by mouth daily. 05/04/20  Yes [provider]  gabapentin (NEURONTIN) 100 MG capsule Take 100 mg by mouth 2 (two) times daily. 06/16/23  Yes [provider]  hydrochlorothiazide  (HYDRODIURIL ) 25 MG tablet  Take 25 mg by mouth daily. 03/26/21  Yes [provider]  HYDROcodone -acetaminophen  (NORCO) 5-325 MG tablet Take 1 tablet by mouth every 6 (six) hours as needed for moderate pain. 07/29/22  Yes Alanda Allegra, MD  levothyroxine  (SYNTHROID ) 88 MCG tablet Take 88 mcg by mouth daily before breakfast. 03/17/22  Yes [provider]  meclizine (DRAMAMINE) 25 MG tablet Take 25 mg by mouth 3 (three) times daily as needed for nausea.   Yes [provider]  metFORMIN (GLUCOPHAGE) 500 MG tablet Take 500 mg by mouth daily. 12/30/21  Yes [provider]  metoprolol  tartrate (LOPRESSOR ) 25 MG tablet Take 25 mg by mouth daily. 06/04/16  Yes [provider]  pantoprazole  (PROTONIX ) 40 MG tablet Take 1 tablet by mouth once daily 11/25/22  Yes Jaritza Duignan, Windsor Hatcher, MD  penicillin v potassium (VEETID) 500 MG tablet Take 500 mg by mouth daily. 01/07/22  Yes [provider]  predniSONE  (DELTASONE ) 5 MG tablet Take 5 mg by mouth daily as needed (back pain).   Yes [provider]  vitamin E 180 MG (400 UNITS) capsule Take 400 Units by mouth every other day.   Yes [provider]    Allergies as of 06/22/2023 - Review Complete 06/22/2023  Allergen Reaction Noted   Cyclobenzaprine  03/12/2016   Tramadol  Other (See Comments) 03/12/2016    Family History  Problem Relation Age of Onset   Cancer Mother    Heart failure Father     Social History   Socioeconomic History   Marital status: Married    Spouse name: Not on file   Number of children: 4   Years of education: Not on file   Highest education level: Not on file  Occupational History   Occupation: Retired  Tobacco Use   Smoking status: Former    Types: Cigarettes    Passive exposure: Past   Smokeless tobacco: Never  Vaping Use   Vaping status: Never Used  Substance and Sexual Activity   Alcohol  use: No   Drug use: No   Sexual activity: Not Currently  Other Topics Concern   Not on file   Social History Narrative   Not on file   Social Drivers of Health   Financial Resource Strain: Not on file  Food Insecurity: Not on file  Transportation Needs: Not on file  Physical Activity: Not on file  Stress: Not on file  Social Connections: Not on file  Intimate Partner Violence: Not on file    Review of Systems: See HPI, otherwise negative ROS  Physical Exam: BP 119/70 (BP Location: Left Arm, Patient Position: Sitting, Cuff Size: Large)   Pulse 79   Temp 98.7 F (37.1 C) (Oral)   Ht 5\' 1"  (1.549 m)   Wt 151 lb 12.8 oz (68.9 kg)   SpO2 97%   BMI 28.68 kg/m  General:   Alert,  Well-developed, well-nourished, pleasant and cooperative in NAD Skin:  Intact without significant lesions or rashes. Eyes:  Sclera clear, no icterus.   Conjunctiva pink. Ears:  Normal auditory acuity.  Nose:  No deformity, discharge,  or lesions. Mouth:  No deformity or lesions. Neck:  Supple; no masses or thyromegaly. No significant cervical adenopathy. Lungs:  Clear throughout to auscultation.   No wheezes, crackles, or rhonchi. No acute distress. Heart:  Regular rate and rhythm; no murmurs, clicks, rubs,  or gallops. Abdomen: Non-distended, normal bowel sounds.  Soft and nontender without appreciable mass or hepatosplenomegaly.  Pulses:  Normal pulses noted. Extremities:  Without clubbing or edema.  Impression/Plan: 78 year old lady with well compensated MAS LD.  Low meld.  Esophageal dysphagia no response to large bore Maloney dilation.  Esophagus appeared endoscopically normal.  Recommendations:   Barium pill esophagram to further evaluate dysphagia Hepatitis B vaccine. Cirrhosis Lifestyle Recommendations:  High-protein diet from a primarily plant-based diet. Avoid red meat.  No raw or undercooked meat, seafood, or shellfish. Low-fat/cholesterol/carbohydrate diet. Limit sodium to no more than 2000 mg/day including everything that you eat and drink. Recommend at least 30 minutes of  aerobic and resistance exercise 3 days/week. Limit Tylenol  to 2000 mg daily.   Office visit with us  in 9 months    Notice: This dictation was prepared with Dragon dictation along with smaller phrase technology. Any transcriptional errors that result from this process are unintentional and may not be corrected upon review.

## 2023-06-25 DIAGNOSIS — U071 COVID-19: Secondary | ICD-10-CM | POA: Diagnosis not present

## 2023-06-25 DIAGNOSIS — E1169 Type 2 diabetes mellitus with other specified complication: Secondary | ICD-10-CM | POA: Diagnosis not present

## 2023-06-25 DIAGNOSIS — E1165 Type 2 diabetes mellitus with hyperglycemia: Secondary | ICD-10-CM | POA: Diagnosis not present

## 2023-06-25 DIAGNOSIS — Z299 Encounter for prophylactic measures, unspecified: Secondary | ICD-10-CM | POA: Diagnosis not present

## 2023-06-25 DIAGNOSIS — R059 Cough, unspecified: Secondary | ICD-10-CM | POA: Diagnosis not present

## 2023-06-28 ENCOUNTER — Other Ambulatory Visit (HOSPITAL_COMMUNITY)

## 2023-06-28 DIAGNOSIS — E1169 Type 2 diabetes mellitus with other specified complication: Secondary | ICD-10-CM | POA: Diagnosis not present

## 2023-06-28 DIAGNOSIS — R52 Pain, unspecified: Secondary | ICD-10-CM | POA: Diagnosis not present

## 2023-06-28 DIAGNOSIS — Z9181 History of falling: Secondary | ICD-10-CM | POA: Diagnosis not present

## 2023-06-28 DIAGNOSIS — Z299 Encounter for prophylactic measures, unspecified: Secondary | ICD-10-CM | POA: Diagnosis not present

## 2023-06-28 DIAGNOSIS — R109 Unspecified abdominal pain: Secondary | ICD-10-CM | POA: Diagnosis not present

## 2023-06-28 DIAGNOSIS — I10 Essential (primary) hypertension: Secondary | ICD-10-CM | POA: Diagnosis not present

## 2023-07-01 ENCOUNTER — Other Ambulatory Visit: Payer: Self-pay | Admitting: Internal Medicine

## 2023-07-30 ENCOUNTER — Ambulatory Visit: Admitting: Student

## 2023-08-03 NOTE — Progress Notes (Deleted)
 Cardiology Office Note    Date:  08/03/2023  ID:  Connie Kent, DOB 1945-03-20, MRN [id number] Cardiologist: None { :  History of Present Illness:    Connie Kent is a 78 y.o. female with past medical history of HTN, HLD, Type II DM, Hypothyroidism and GERD who presents to the office today for follow-up.   She was last examined by Dr. Barbaraann in 06/2021 as a New Patient referral for episodes of chest pain and shortness of breath. Episodes of chest pain were overall felt to be atypical as they were mostly right-sided and were a sharp discomfort which would last for few minutes and then go away. EKG showed no acute ST changes. An echocardiogram and stress test were recommended for further assessment. It appears she canceled her stress test and her echocardiogram showed preserved pumping function of her heart with an ejection fraction of 55 to 60%.  She did not have any regional wall motion normalities. Was noted to have Grade 1 DD, normal RV function and mild MR. Was informed to follow-up with Cardiology as needed going forward.  ROS: ***  Studies Reviewed:   EKG: EKG is*** ordered today and demonstrates ***   EKG Interpretation Date/Time:    Ventricular Rate:    PR Interval:    QRS Duration:    QT Interval:    QTC Calculation:   R Axis:      Text Interpretation:         Echocardiogram: 10/2021 IMPRESSIONS     1. Left ventricular ejection fraction, by estimation, is 55 to 60%. The  left ventricle has normal function. The left ventricle has no regional  wall motion abnormalities. Left ventricular diastolic parameters are  consistent with Grade I diastolic  dysfunction (impaired relaxation). The average left ventricular global  longitudinal strain is -21.3 %. The global longitudinal strain is normal.   2. Right ventricular systolic function is normal. The right ventricular  size is normal. There is normal pulmonary artery systolic pressure. The  estimated right  ventricular systolic pressure is 29.6 mmHg.   3. The mitral valve is abnormal, mildly calcified. Mild mitral valve  regurgitation.   4. The aortic valve is tricuspid. There is mild calcification of the  aortic valve. Aortic valve regurgitation is not visualized. Aortic valve  sclerosis is present, with no evidence of aortic valve stenosis.   5. The inferior vena cava is normal in size with greater than 50%  respiratory variability, suggesting right atrial pressure of 3 mmHg.   Comparison(s): No prior Echocardiogram.    Risk Assessment/Calculations:   {Does this patient have ATRIAL FIBRILLATION?:[id number]} No BP recorded.  {Refresh Note OR Click here to enter BP  :1}***         Physical Exam:   VS:  There were no vitals taken for this visit.   Wt Readings from Last 3 Encounters:  06/22/23 151 lb 12.8 oz (68.9 kg)  03/24/23 147 lb 7.8 oz (66.9 kg)  02/12/23 147 lb 6.4 oz (66.9 kg)     GEN: Well nourished, well developed in no acute distress NECK: No JVD; No carotid bruits CARDIAC: ***RRR, no murmurs, rubs, gallops RESPIRATORY:  Clear to auscultation without rales, wheezing or rhonchi  ABDOMEN: Appears non-distended. No obvious abdominal masses. EXTREMITIES: No clubbing or cyanosis. No edema.  Distal pedal pulses are 2+ bilaterally.   Assessment and Plan:   1. Chest Pain - ***   2. HTN - ***   3. HLD - LDL  was at 138 in 02/2022.   Signed, Laymon CHRISTELLA Qua, PA-C

## 2023-08-04 ENCOUNTER — Encounter: Payer: Self-pay | Admitting: Student

## 2023-08-04 ENCOUNTER — Ambulatory Visit: Attending: Student | Admitting: Student

## 2023-08-12 DIAGNOSIS — E1165 Type 2 diabetes mellitus with hyperglycemia: Secondary | ICD-10-CM | POA: Diagnosis not present

## 2023-08-13 DIAGNOSIS — I10 Essential (primary) hypertension: Secondary | ICD-10-CM | POA: Diagnosis not present

## 2023-08-13 DIAGNOSIS — Z299 Encounter for prophylactic measures, unspecified: Secondary | ICD-10-CM | POA: Diagnosis not present

## 2023-08-13 DIAGNOSIS — Z0000 Encounter for general adult medical examination without abnormal findings: Secondary | ICD-10-CM | POA: Diagnosis not present

## 2023-08-13 DIAGNOSIS — E1169 Type 2 diabetes mellitus with other specified complication: Secondary | ICD-10-CM | POA: Diagnosis not present

## 2023-08-18 DIAGNOSIS — E7800 Pure hypercholesterolemia, unspecified: Secondary | ICD-10-CM | POA: Diagnosis not present

## 2023-08-18 DIAGNOSIS — Z0000 Encounter for general adult medical examination without abnormal findings: Secondary | ICD-10-CM | POA: Diagnosis not present

## 2023-09-01 DIAGNOSIS — I10 Essential (primary) hypertension: Secondary | ICD-10-CM | POA: Diagnosis not present

## 2023-09-01 DIAGNOSIS — M549 Dorsalgia, unspecified: Secondary | ICD-10-CM | POA: Diagnosis not present

## 2023-09-01 DIAGNOSIS — R52 Pain, unspecified: Secondary | ICD-10-CM | POA: Diagnosis not present

## 2023-09-01 DIAGNOSIS — E1169 Type 2 diabetes mellitus with other specified complication: Secondary | ICD-10-CM | POA: Diagnosis not present

## 2023-09-01 DIAGNOSIS — Z299 Encounter for prophylactic measures, unspecified: Secondary | ICD-10-CM | POA: Diagnosis not present

## 2023-09-11 DIAGNOSIS — E1165 Type 2 diabetes mellitus with hyperglycemia: Secondary | ICD-10-CM | POA: Diagnosis not present

## 2023-09-28 DIAGNOSIS — Z2821 Immunization not carried out because of patient refusal: Secondary | ICD-10-CM | POA: Diagnosis not present

## 2023-09-28 DIAGNOSIS — I10 Essential (primary) hypertension: Secondary | ICD-10-CM | POA: Diagnosis not present

## 2023-09-28 DIAGNOSIS — Z299 Encounter for prophylactic measures, unspecified: Secondary | ICD-10-CM | POA: Diagnosis not present

## 2023-09-28 DIAGNOSIS — R42 Dizziness and giddiness: Secondary | ICD-10-CM | POA: Diagnosis not present

## 2023-10-11 DIAGNOSIS — R42 Dizziness and giddiness: Secondary | ICD-10-CM | POA: Diagnosis not present

## 2023-10-12 DIAGNOSIS — E1165 Type 2 diabetes mellitus with hyperglycemia: Secondary | ICD-10-CM | POA: Diagnosis not present

## 2023-10-29 DIAGNOSIS — E119 Type 2 diabetes mellitus without complications: Secondary | ICD-10-CM | POA: Diagnosis not present

## 2023-10-29 DIAGNOSIS — I10 Essential (primary) hypertension: Secondary | ICD-10-CM | POA: Diagnosis not present

## 2023-10-29 DIAGNOSIS — M549 Dorsalgia, unspecified: Secondary | ICD-10-CM | POA: Diagnosis not present

## 2023-10-29 DIAGNOSIS — Z1331 Encounter for screening for depression: Secondary | ICD-10-CM | POA: Diagnosis not present

## 2023-10-29 DIAGNOSIS — Z7189 Other specified counseling: Secondary | ICD-10-CM | POA: Diagnosis not present

## 2023-10-29 DIAGNOSIS — R52 Pain, unspecified: Secondary | ICD-10-CM | POA: Diagnosis not present

## 2023-10-29 DIAGNOSIS — Z299 Encounter for prophylactic measures, unspecified: Secondary | ICD-10-CM | POA: Diagnosis not present

## 2023-10-29 DIAGNOSIS — Z0000 Encounter for general adult medical examination without abnormal findings: Secondary | ICD-10-CM | POA: Diagnosis not present

## 2023-10-29 DIAGNOSIS — Z1339 Encounter for screening examination for other mental health and behavioral disorders: Secondary | ICD-10-CM | POA: Diagnosis not present

## 2023-11-02 ENCOUNTER — Ambulatory Visit: Admitting: Internal Medicine

## 2023-11-15 ENCOUNTER — Ambulatory Visit (HOSPITAL_COMMUNITY)
Admission: RE | Admit: 2023-11-15 | Discharge: 2023-11-15 | Disposition: A | Discharge status: Home / Self Care | Source: Ambulatory Visit | Attending: Urology | Admitting: Urology

## 2023-11-15 DIAGNOSIS — N289 Disorder of kidney and ureter, unspecified: Secondary | ICD-10-CM | POA: Diagnosis present

## 2023-11-15 DIAGNOSIS — N281 Cyst of kidney, acquired: Secondary | ICD-10-CM | POA: Diagnosis not present

## 2023-11-15 DIAGNOSIS — N2889 Other specified disorders of kidney and ureter: Secondary | ICD-10-CM | POA: Insufficient documentation

## 2023-11-22 ENCOUNTER — Telehealth: Payer: Self-pay

## 2023-11-22 NOTE — Telephone Encounter (Signed)
 Called to make sure pt US  came in confirmed US  and routed images to MD

## 2023-11-24 ENCOUNTER — Ambulatory Visit: Payer: Medicare PPO | Admitting: Urology

## 2023-11-24 VITALS — BP 125/75 | HR 75

## 2023-11-24 DIAGNOSIS — N3289 Other specified disorders of bladder: Secondary | ICD-10-CM

## 2023-11-24 DIAGNOSIS — N3001 Acute cystitis with hematuria: Secondary | ICD-10-CM

## 2023-11-24 DIAGNOSIS — N289 Disorder of kidney and ureter, unspecified: Secondary | ICD-10-CM

## 2023-11-24 LAB — MICROSCOPIC EXAMINATION: WBC, UA: >30 /HPF — AB (ref 0–5)

## 2023-11-24 LAB — URINALYSIS, ROUTINE W REFLEX MICROSCOPIC
Bilirubin, UA: NEGATIVE
Glucose, UA: NEGATIVE
Ketones, UA: NEGATIVE
Nitrite, UA: POSITIVE — AB
Specific Gravity, UA: 1.02 (ref 1.005–1.030)
Urobilinogen, Ur: 0.2 mg/dL (ref 0.2–1.0)
pH, UA: 5.5 (ref 5.0–7.5)

## 2023-11-24 MED ORDER — NITROFURANTOIN MONOHYD MACRO 100 MG PO CAPS: 100.0000 mg | ORAL_CAPSULE | Freq: Two times a day (BID) | ORAL | 0 refills | Status: AC

## 2023-11-24 NOTE — Progress Notes (Signed)
 11/24/2023 1:56 PM   Connie Kent 01-03-1946 [id number]  Referring provider: Maree Isles, MD [address] [place],  [place] [address]  Left renal mass   HPI: Connie Kent is a 78yo ere for followup for a left renal mass and for worsening urinary frequency and rugency. Renal US  from 11/3 shows a stable 1.8cm left renal mass. She denies any flank pain. She notes increased urinary urgency, frequency, dysuria and nocturia over the past 10 days. UA is concerning for infection. No other complaints today   PMH: Past Medical History:  Diagnosis Date   Diabetes mellitus without complication (HCC)    GERD (gastroesophageal reflux disease)    H. pylori infection    Confirmed eradication on EGD in September 2022   Hyperlipidemia    Hypertension    Thyroid  disease     Surgical History: Past Surgical History:  Procedure Laterality Date   ABDOMINAL HYSTERECTOMY     BIOPSY  09/23/2020   Procedure: BIOPSY;  Surgeon: Shaaron Lamar HERO, MD;  [facility];  Service: Endoscopy;;   breast biopsies     BREAST BIOPSY Bilateral    beghin   CHOLECYSTECTOMY     COLONOSCOPY  06/02/1999   Dr. Shaaron; internal and external hemorrhoids, poor prep.   COLONOSCOPY  09/08/2002   Dr. Shaaron; friable internal hemorrhoids, scattered left-sided diverticula, otherwise normal colon and TI.   COLONOSCOPY WITH PROPOFOL  N/A 02/25/2022   Procedure: COLONOSCOPY WITH PROPOFOL ;  Surgeon: Shaaron Lamar HERO, MD;  [facility];  Service: Endoscopy;  Laterality: N/A;  7:30 am   ESOPHAGOGASTRODUODENOSCOPY  2003   Dr. Shaaron; normal esophagus s/p empiric dilation, superficial antral erosions.   ESOPHAGOGASTRODUODENOSCOPY  09/08/2002   Small hiatal hernia, otherwise normal.   ESOPHAGOGASTRODUODENOSCOPY  08/22/2007   Dr. Shaaron; normal esophagus s/p empiric dilation, small hiatal hernia, benign-appearing polypoid antral mucosa, otherwise normal.   ESOPHAGOGASTRODUODENOSCOPY (EGD) WITH PROPOFOL  N/A 09/23/2020    Procedure: ESOPHAGOGASTRODUODENOSCOPY (EGD) WITH PROPOFOL ;  Surgeon: Shaaron Lamar HERO, MD;  [facility];  Service: Endoscopy;  Laterality: N/A;  7:30am   ESOPHAGOGASTRODUODENOSCOPY (EGD) WITH PROPOFOL  N/A 03/24/2023   Procedure: ESOPHAGOGASTRODUODENOSCOPY (EGD) WITH PROPOFOL ;  Surgeon: Shaaron Lamar HERO, MD;  [facility];  Service: Endoscopy;  Laterality: N/A;  1:00 pm, asa 3   EYE SURGERY     HEMORRHOID SURGERY     MALONEY DILATION N/A 09/23/2020   Procedure: MALONEY DILATION;  Surgeon: Shaaron Lamar HERO, MD;  [facility];  Service: Endoscopy;  Laterality: N/A;   MALONEY DILATION  03/24/2023   Procedure: DILATION, ESOPHAGUS, USING MALONEY DILATOR;  Surgeon: Shaaron Lamar HERO, MD;  [facility];  Service: Endoscopy;;   POLYPECTOMY  02/25/2022   Procedure: POLYPECTOMY;  Surgeon: Shaaron Lamar HERO, MD;  [facility];  Service: Endoscopy;;   THROAT SURGERY     Biopsy   XI ROBOTIC ASSISTED VENTRAL HERNIA N/A 07/14/2022   Procedure: XI ROBOTIC ASSISTED VENTRAL HERNIA W/ MESH;  Surgeon: Mavis Anes, MD;  Location: [facility];  Service: General;  Laterality: N/A;    Home Medications:  Allergies as of 11/24/2023       Reactions   Cyclobenzaprine    sick   Tramadol  Other (See Comments)   sick        Medication List        Accurate as of November 24, 2023  1:56 PM. If you have any questions, ask your nurse or doctor.  acetaminophen  500 MG tablet Commonly known as: TYLENOL  Take 1,000 mg by mouth every 6 (six) hours as needed for moderate pain.   B-12 2500 MCG Tabs Take 2,500 mcg by mouth every other day.   Biotin 10 MG Tabs Take 10 mg by mouth daily.   dicyclomine 20 MG tablet Commonly known as: BENTYL Take 20 mg by mouth 3 (three) times daily.   Dramamine 25 MG tablet Generic drug: meclizine Take 25 mg by mouth 3 (three) times daily as needed for nausea.   esomeprazole  40 MG capsule Commonly known as: NEXIUM  Take 40 mg  by mouth daily.   FLUoxetine 20 MG capsule Commonly known as: PROZAC Take 20 mg by mouth daily.   gabapentin 100 MG capsule Commonly known as: NEURONTIN Take 100 mg by mouth 2 (two) times daily.   hydrochlorothiazide  25 MG tablet Commonly known as: HYDRODIURIL  Take 25 mg by mouth daily.   HYDROcodone -acetaminophen  5-325 MG tablet Commonly known as: Norco Take 1 tablet by mouth every 6 (six) hours as needed for moderate pain.   levothyroxine  88 MCG tablet Commonly known as: SYNTHROID  Take 88 mcg by mouth daily before breakfast.   metFORMIN 500 MG tablet Commonly known as: GLUCOPHAGE Take 500 mg by mouth daily.   metoprolol  tartrate 25 MG tablet Commonly known as: LOPRESSOR  Take 25 mg by mouth daily.   pantoprazole  40 MG tablet Commonly known as: PROTONIX  TAKE 1 TABLET EVERY DAY   penicillin v potassium 500 MG tablet Commonly known as: VEETID Take 500 mg by mouth daily.   predniSONE  5 MG tablet Commonly known as: DELTASONE  Take 5 mg by mouth daily as needed (back pain).   vitamin E 180 MG (400 UNITS) capsule Take 400 Units by mouth every other day.        Allergies:  Allergies  Allergen Reactions   Cyclobenzaprine     sick   Tramadol  Other (See Comments)    sick    Family History: Family History  Problem Relation Age of Onset   Cancer Mother    Heart failure Father     Social History:  reports that she has quit smoking. Her smoking use included cigarettes. She has been exposed to tobacco smoke. She has never used smokeless tobacco. She reports that she does not drink alcohol  and does not use drugs.  ROS: All other review of systems were reviewed and are negative except what is noted above in HPI  Physical Exam: BP 125/75   Pulse 75   Constitutional:  Alert and oriented, No acute distress. HEENT: [place] AT, moist mucus membranes.  Trachea midline, no masses. Cardiovascular: No clubbing, cyanosis, or edema. Respiratory: Normal respiratory effort, no  increased work of breathing. GI: Abdomen is soft, nontender, nondistended, no abdominal masses GU: No CVA tenderness.  Lymph: No cervical or inguinal lymphadenopathy. Skin: No rashes, bruises or suspicious lesions. Neurologic: Grossly intact, no focal deficits, moving all 4 extremities. Psychiatric: Normal mood and affect.  Laboratory Data: Lab Results  Component Value Date   WBC 5.3 02/16/2023   HGB 11.5 (L) 02/16/2023   HCT 37.2 02/16/2023   MCV 91.9 02/16/2023   PLT 258 02/16/2023    Lab Results  Component Value Date   CREATININE 0.95 02/16/2023    No results found for: PSA  No results found for: TESTOSTERONE  Lab Results  Component Value Date   HGBA1C 5.9 (H) 04/29/2022    Urinalysis    Component Value Date/Time   COLORURINE YELLOW 07/31/2020 1651  APPEARANCEUR Clear 11/23/2022 1408   LABSPEC 1.017 07/31/2020 1651   PHURINE 6.0 07/31/2020 1651   GLUCOSEU Negative 11/23/2022 1408   HGBUR NEGATIVE 07/31/2020 1651   BILIRUBINUR Negative 11/23/2022 1408   KETONESUR NEGATIVE 07/31/2020 1651   PROTEINUR Trace 11/23/2022 1408   PROTEINUR NEGATIVE 07/31/2020 1651   UROBILINOGEN negative 01/31/2015 1149   NITRITE Positive (A) 11/23/2022 1408   NITRITE NEGATIVE 07/31/2020 1651   LEUKOCYTESUR 2+ (A) 11/23/2022 1408   LEUKOCYTESUR NEGATIVE 07/31/2020 1651    Lab Results  Component Value Date   LABMICR See below: 11/23/2022   WBCUA >30 (A) 11/23/2022   LABEPIT 0-10 11/23/2022   MUCUS Present (A) 11/26/2021   BACTERIA Many (A) 11/23/2022    Pertinent Imaging: Renal US  11/15/23: Images reviewed and discussed with the patient  Results for orders placed in visit on 04/29/22  DG Abd 1 View  Narrative CLINICAL DATA:  Flank pain, generalized abdominal pain and bloating  EXAM: ABDOMEN - 1 VIEW  COMPARISON:  None Available.  FINDINGS: Surgical clips RIGHT upper quadrant likely reflect prior cholecystectomy.  Nonobstructive bowel gas pattern with  scattered stool throughout colon.  No bowel dilatation or bowel wall thickening.  No urinary tract calcification.  IMPRESSION: No acute abnormalities.   Electronically Signed By: Oneil Kiss M.D. On: 05/02/2022 12:04  No results found for this or any previous visit.  No results found for this or any previous visit.  No results found for this or any previous visit.  Results for orders placed during the hospital encounter of 11/15/23  Ultrasound renal complete  Narrative EXAM: US  Retroperitoneum Complete, Renal.  CLINICAL HISTORY: Left renal mass.  TECHNIQUE: Real-time ultrasound of the retroperitoneum (complete) with image documentation.  COMPARISON: US  Renal 11/17/2022.  FINDINGS:  RIGHT KIDNEY: Measures 8.6 x 3.0 x 2.8 cm. There is a cyst in the inferior pole of the right kidney measuring 1.5 x 1.3 x 1.5 cm. No hydronephrosis or renal stone visualized.  LEFT KIDNEY: Measures 10.7 x 4.2 x 3.9 cm. There is an echogenic mass in the superior pole of the left kidney measuring 1.9 x 2.0 x 1.7 cm, stable in size. No hydronephrosis or renal stone visualized.  BLADDER: There is a questionable mass in the bladder measuring 1.4 x 3.5 x 2.7 cm which appears new from prior. The ureteral jets are not seen.  IMPRESSION: 1. Stable echogenic mass in the superior pole of the left kidney measuring 1.9 x 2.0 x 1.7 cm. Consider nonemergent renal-protocol CT or MRI if not previously characterized. 2. Questionable new bladder mass measuring 1.4 x 3.5 x 2.7 cm with nonvisualized ureteral jets; recommend correlation with urinalysis and urology consultation for cystoscopic evaluation. Consider dedicated bladder ultrasound or contrast-enhanced CT/MRI for further characterization. 3. Simple cyst in the inferior pole of the right kidney measuring 1.5 x 1.3 x 1.5 cm; benign, no follow-up imaging recommended.  Electronically signed by: Greig Pique MD 11/20/2023 07:21 PM EST  RP Workstation: [id number]  No results found for this or any previous visit.  No results found for this or any previous visit.  No results found for this or any previous visit.   Assessment & Plan:    1. Renal lesion (Primary) Followup 1 year with renal US  - Urinalysis, Routine w reflex microscopic - Urine Culture  2. Acute cystitis with hematuria Urine for culture  3. Bladde rmass -office cystoscopy   No follow-ups on file.  Belvie Clara, MD  [facility] [place]

## 2023-11-27 LAB — URINE CULTURE

## 2023-11-29 ENCOUNTER — Ambulatory Visit: Admitting: Internal Medicine

## 2023-11-30 ENCOUNTER — Ambulatory Visit: Payer: Self-pay

## 2023-11-30 ENCOUNTER — Encounter: Payer: Self-pay | Admitting: Internal Medicine

## 2023-12-02 ENCOUNTER — Encounter: Payer: Self-pay | Admitting: Urology

## 2023-12-02 NOTE — Patient Instructions (Signed)
 A Growth in the Kidney (Renal Mass): What to Know  A renal mass is an abnormal growth in the kidney. It may be found during an MRI, CT scan, or ultrasound that's done to check for other problems in the belly. Some renal masses are cancerous, or malignant, and can grow or spread quickly. Others are benign, which means they're not cancer. Renal masses include: Tumors. These may be either cancerous or benign. The most common cancerous tumor in adults is called renal cell carcinoma. In children, the most common type is Wilms tumor. The most common kidney tumors that aren't cancer include renal adenomas, oncocytomas, and angiomyolipomas (AML). Cysts. These are pockets of fluid that form on or in the kidney. What are the causes? Certain types of cancers, infections, or injuries can cause a renal mass. It's not always known what causes a cyst to form in or on the kidney. What are the signs or symptoms? Often, a renal mass or kidney cyst doesn't cause any signs or symptoms. How is this diagnosed? Your health care provider may suggest tests to diagnose the cause of your renal mass. These tests may include: A physical exam. Blood tests. Pee (urine) tests. Imaging tests, such as: CT scan. MRI. Ultrasound. Chest X-ray or bone scan. These may be done if a tumor is cancerous to see if the cancer has spread outside the kidney. Biopsy. This is when a small piece of tissue is removed from the renal mass for testing. How is this treated? Treatment is not always needed for a renal mass. Treatment will depend on the cause of the mass and if it's causing any problems or symptoms. For a cancerous renal mass, treatment options may include: Surgery. This is done to remove the tumor and any affected tissue. Chemotherapy. This uses medicines to kill cancer cells. Radiation. High-energy X-rays or gamma rays are used to kill cancer cells. Ablation. This uses extreme hot or cold temperature to kill the cancer  cells. Immunotherapy. Medicines are used to help the body's defense system (immune system) fight the cancer cells. Taking part in clinical trials. This involves trying new or experimental treatments to see if they're effective. Most kidney cysts don't need to be treated. Follow these instructions at home: What you need to do at home will depend on the cause of the mass. Follow the instructions that your provider gives you. In general: Take medicines only as told. If you were given antibiotics, take them as told. Do not stop taking them even if you start to feel better. Follow any instructions from your provider about what you can and can't do. Do not smoke, vape, or use nicotine or tobacco. Keep all follow-up visits. Your provider will need to check if your renal mass has changed or grown. Contact a health care provider if: You have flank pain, which is pain in your side or back. You have a fever. You have a loss of appetite. You have pain or swelling in your belly. You lose weight. Get help right away if: Your pain gets worse. There's blood in your pee. You can't pee. This information is not intended to replace advice given to you by your health care provider. Make sure you discuss any questions you have with your health care provider. Document Revised: 06/26/2022 Document Reviewed: 06/26/2022 [employer] Patient Education  2025 [employer].

## 2024-02-11 ENCOUNTER — Other Ambulatory Visit: Payer: Self-pay | Admitting: Internal Medicine

## 2024-06-07 ENCOUNTER — Other Ambulatory Visit: Admitting: Urology
